# Patient Record
Sex: Male | Born: 1960 | Race: White | Hispanic: No | Smoking: Never smoker
Health system: Southern US, Community
[De-identification: ages and names within clinical notes are randomized; demographics above are authoritative.]

## PROBLEM LIST (undated history)

## (undated) DIAGNOSIS — H409 Unspecified glaucoma: Secondary | ICD-10-CM

## (undated) DIAGNOSIS — D649 Anemia, unspecified: Secondary | ICD-10-CM

## (undated) DIAGNOSIS — E785 Hyperlipidemia, unspecified: Secondary | ICD-10-CM

## (undated) DIAGNOSIS — C787 Secondary malignant neoplasm of liver and intrahepatic bile duct: Secondary | ICD-10-CM

## (undated) DIAGNOSIS — C19 Malignant neoplasm of rectosigmoid junction: Secondary | ICD-10-CM

## (undated) HISTORY — DX: Hyperlipidemia, unspecified: E78.5

## (undated) HISTORY — DX: Anemia, unspecified: D64.9

---

## 1999-01-04 ENCOUNTER — Encounter: Payer: Self-pay | Admitting: Emergency Medicine

## 1999-01-04 ENCOUNTER — Emergency Department (HOSPITAL_COMMUNITY): Admission: EM | Admit: 1999-01-04 | Discharge: 1999-01-04 | Payer: Self-pay

## 1999-01-14 ENCOUNTER — Encounter: Admission: RE | Admit: 1999-01-14 | Discharge: 1999-01-27 | Payer: Self-pay | Admitting: *Deleted

## 1999-02-23 ENCOUNTER — Encounter: Admission: RE | Admit: 1999-02-23 | Discharge: 1999-02-23 | Payer: Self-pay | Admitting: *Deleted

## 2002-02-14 ENCOUNTER — Encounter: Admission: RE | Admit: 2002-02-14 | Discharge: 2002-02-14 | Payer: Self-pay | Admitting: Family Medicine

## 2002-02-14 ENCOUNTER — Encounter: Payer: Self-pay | Admitting: Family Medicine

## 2004-05-15 HISTORY — PX: GINGIVECTOMY: SHX1707

## 2007-10-11 ENCOUNTER — Encounter: Admission: RE | Admit: 2007-10-11 | Discharge: 2007-10-11 | Payer: Self-pay | Admitting: Family Medicine

## 2011-12-15 ENCOUNTER — Other Ambulatory Visit: Payer: Self-pay | Admitting: Gastroenterology

## 2011-12-15 ENCOUNTER — Encounter (INDEPENDENT_AMBULATORY_CARE_PROVIDER_SITE_OTHER): Payer: Self-pay | Admitting: General Surgery

## 2011-12-15 DIAGNOSIS — K5900 Constipation, unspecified: Secondary | ICD-10-CM

## 2011-12-15 DIAGNOSIS — D490 Neoplasm of unspecified behavior of digestive system: Secondary | ICD-10-CM

## 2011-12-15 DIAGNOSIS — D649 Anemia, unspecified: Secondary | ICD-10-CM

## 2011-12-15 DIAGNOSIS — R634 Abnormal weight loss: Secondary | ICD-10-CM

## 2011-12-18 ENCOUNTER — Ambulatory Visit
Admission: RE | Admit: 2011-12-18 | Discharge: 2011-12-18 | Disposition: A | Discharge status: Home / Self Care | Payer: Federal, State, Local not specified - PPO | Source: Ambulatory Visit | Attending: Gastroenterology | Admitting: Gastroenterology

## 2011-12-18 DIAGNOSIS — R634 Abnormal weight loss: Secondary | ICD-10-CM

## 2011-12-18 DIAGNOSIS — D490 Neoplasm of unspecified behavior of digestive system: Secondary | ICD-10-CM

## 2011-12-18 DIAGNOSIS — D649 Anemia, unspecified: Secondary | ICD-10-CM

## 2011-12-18 DIAGNOSIS — K5900 Constipation, unspecified: Secondary | ICD-10-CM

## 2011-12-18 MED ORDER — IOHEXOL 300 MG/ML  SOLN
100.0000 mL | Freq: Once | INTRAMUSCULAR | Status: AC | PRN
  Administered 2011-12-18: 100 mL via INTRAVENOUS

## 2011-12-19 ENCOUNTER — Ambulatory Visit (INDEPENDENT_AMBULATORY_CARE_PROVIDER_SITE_OTHER): Payer: Federal, State, Local not specified - PPO | Admitting: General Surgery

## 2011-12-19 ENCOUNTER — Encounter (INDEPENDENT_AMBULATORY_CARE_PROVIDER_SITE_OTHER): Payer: Self-pay | Admitting: General Surgery

## 2011-12-19 VITALS — BP 122/68 | HR 70 | Temp 98.0°F | Resp 16 | Ht 72.0 in | Wt 185.2 lb

## 2011-12-19 DIAGNOSIS — C787 Secondary malignant neoplasm of liver and intrahepatic bile duct: Secondary | ICD-10-CM

## 2011-12-19 DIAGNOSIS — C19 Malignant neoplasm of rectosigmoid junction: Secondary | ICD-10-CM

## 2011-12-19 HISTORY — DX: Malignant neoplasm of rectosigmoid junction: C19

## 2011-12-19 HISTORY — DX: Secondary malignant neoplasm of liver and intrahepatic bile duct: C78.7

## 2011-12-19 NOTE — Progress Notes (Signed)
Patient ID: Lawrence Villa, male   DOB: 1961-01-13, 51 y.o.   MRN: [id number]  Chief Complaint  Patient presents with  . Colon Cancer    HPI Lawrence Villa is a 51 y.o. male.  He is referred by Dr. 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  The patient has noted some blood in his stools for about 6 months. He has problems with insomnia. He was found to have mild anemia. In terms of GI symptoms he has intermittent cramps and mild nausea but does not vomit. He's had a 20 pound weight loss in the past 2 months.  Recent colonoscopy was performed and reveals a circumferential fungating mass at 15 cm from the anal verge. Biopsies were taken which showed adenocarcinoma. The lumen was only a few millimeters in diameter the scope could not be passed. The rectum was normal.  CEA level is 80.6. Liver function tests are normal.  CT scan shows a circumferential mass in the mid to distal sigmoid colon and a suggestion of extraluminal spread. There are numerous liver masses consistent with metastasis. There is no bowel distention. There is no ascites.  I discussed the patient's colonoscopy findings, pathology report, and CT scan with him. He is aware that he is locally advanced cancer. His mother is here with him today.  His past history is significant for mild anemia, insomnia, anxiety. Family history is negative for colon cancer although one uncle had some type of abdominal cancer but specifics are unknown.   Social history reveals he is divorced and has 2 daughters. Denies tobacco. Drinks alcohol rarely. 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 HPI  Past Medical History  Diagnosis Date  . Hyperlipidemia   . Insomnia   . Anemia   . Cancer     colon  . Chills   . Unexplained weight loss   . Cough   . Constipation   . Rectal bleeding   . Blood in stool     Past Surgical History  Procedure Date  .  Gingivectomy 2006    Family History  Problem Relation Age of Onset  . Heart attack Paternal Grandfather     Social History History  Substance Use Topics  . Smoking status: Never Smoker   . Smokeless tobacco: Never Used  . Alcohol Use: No    No Known Allergies  Current Outpatient Prescriptions  Medication Sig Dispense Refill  . CALCIUM PO Take by mouth.      . Ferrous Sulfate (IRON) 325 (65 FE) MG TABS Take by mouth.      . Flaxseed, Linseed, (FLAX SEED OIL PO) Take by mouth.      . Glucosamine-Chondroit-Vit C-Mn (GLUCOSAMINE 1500 COMPLEX PO) Take by mouth.      . Lecithin 1200 MG CAPS Take by mouth.      Marland Kitchen LORazepam (ATIVAN) 0.5 MG tablet Take 0.5 mg by mouth every 8 (eight) hours.      . Magnesium 250 MG TABS Take by mouth.      . Multiple Vitamins-Minerals (CENTRUM PO) Take by mouth.      . Polyethylene Glycol 3350 (MIRALAX PO) Take by mouth.      . QUEtiapine (SEROQUEL) 25 MG tablet Take 25 mg by mouth at bedtime.      . vitamin E 400 UNIT capsule Take 400 Units by mouth daily.        Review of  Systems Review of Systems  Constitutional: Positive for unexpected weight change. Negative for fever and chills.  HENT: Negative for hearing loss, congestion, sore throat, trouble swallowing and voice change.   Eyes: Negative for visual disturbance.  Respiratory: Negative for cough and wheezing.   Cardiovascular: Negative for chest pain, palpitations and leg swelling.  Gastrointestinal: Positive for nausea, abdominal pain and blood in stool. Negative for vomiting, diarrhea, constipation, abdominal distention, anal bleeding and rectal pain.  Genitourinary: Negative for hematuria and difficulty urinating.  Musculoskeletal: Negative for arthralgias.  Skin: Negative for rash and wound.  Neurological: Negative for seizures, syncope, weakness and headaches.  Hematological: Negative for adenopathy. Does not bruise/bleed easily.  Psychiatric/Behavioral: Positive for disturbed  wake/sleep cycle. Negative for confusion. The patient is nervous/anxious.     Blood pressure 122/68, pulse 70, temperature 98 F (36.7 C), temperature source Temporal, resp. rate 16, height 6' (1.829 m), weight 185 lb 4 oz (84.029 kg).  Physical Exam Physical Exam  Constitutional: He is oriented to person, place, and time. He appears well-developed and well-nourished. No distress.  HENT:  Head: Normocephalic.  Nose: Nose normal.  Mouth/Throat: No oropharyngeal exudate.  Eyes: Conjunctivae and EOM are normal. Pupils are equal, round, and reactive to light. Right eye exhibits no discharge. Left eye exhibits no discharge. No scleral icterus.  Neck: Normal range of motion. Neck supple. No JVD present. No tracheal deviation present. No thyromegaly present.  Cardiovascular: Normal rate, regular rhythm, normal heart sounds and intact distal pulses.   No murmur heard. Pulmonary/Chest: Effort normal and breath sounds normal. No stridor. No respiratory distress. He has no wheezes. He has no rales. He exhibits no tenderness.  Abdominal: Soft. Bowel sounds are normal. He exhibits no distension and no mass. There is no tenderness. There is no rebound and no guarding.       The liver edge is palpable and the liver is a little bit tender. There is no mass in the left lower quadrant  or suprapubic area, and he does not seem tender down there. There is no inguinal adenopathy. His abdomen is not distended.  Musculoskeletal: Normal range of motion. He exhibits no edema and no tenderness.  Lymphadenopathy:    He has no cervical adenopathy.  Neurological: He is alert and oriented to person, place, and time. He has normal reflexes. Coordination normal.  Skin: Skin is warm and dry. No rash noted. He is not diaphoretic. No erythema. No pallor.  Psychiatric: He has a normal mood and affect. His behavior is normal. Judgment and thought content normal.    Data Reviewed I have reviewed the upper endoscopy findings,  colonoscopy findings, CT scan, lab work, and pathology report. I have discussed the case with Dr. Dulce Sellar.  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 We have rearranged our schedule and have scheduled the surgery for 7:30 AM this coming Friday, August 9.  He will stay on a blenderized diet until tomorrow and then go on a clear liquid diet. He will proceed with bowel prep on Thursday.  He will be scheduled for low anterior resection, possible colostomy, possible primary anastomosis with diverting loop ileostomy.  I discussed the indications, details, techniques, and numerous risks of the surgery with the patient and his mother. They're  aware that he has advanced cancer and that he is at higher risk for complications. Because of the bulky nature of his disease and the possible invasion of adjacent organs, the surgery will be performed as an open procedure.  We will involve medical oncology in his care postop, once we have resolved the mechanical obstruction.       Angelia Mould. Derrell Lolling, M.D., [facility] General and Minimally invasive Surgery Breast and Colorectal Surgery Office:   [phone] Pager:   [phone]  12/19/2011, 5:09 PM

## 2011-12-19 NOTE — Patient Instructions (Signed)
Your surgery is scheduled for 7:30 AM this coming Friday at [facility].  Please change to a clear liquid diet after breakfast tomorrow, Wednesday.  Be sure to take a bowel prep laxative as well as the antibiotics on Thursday.   Cancer of the Colon, Treatment by Resection  Care After Please read the instructions outlined below and refer to this sheet in the next few weeks. These discharge instructions provide you with general information on caring for yourself after you leave the hospital. Your caregiver may also give you specific instructions. While your treatment has been planned according to the most current medical practices available, unavoidable complications occasionally occur. If you have any problems or questions after discharge, please call your caregiver. HOME CARE INSTRUCTIONS   It will be normal to be sore for a couple weeks following surgery. See your caregiver if this seems to be getting worse rather than better.   Take prescribed medication as directed. Only take over-the-counter or prescription medicines for pain, discomfort, or fever as directed by your caregiver.   Use showers for bathing, until seen or do as instructed.   Follow your caregiver's instructions as to activities, exercises, physical therapy, and driving a car.   Change dressings if necessary or as directed.   You may resume normal diet and activities as directed or allowed.   Avoid lifting until you are instructed otherwise.   If you have stitches, make an appointment to have them removed by your caregiver when instructed.  SEEK MEDICAL CARE IF:   There is increased bleeding (more than a small spot) from the wound.   You notice redness, swelling, or increasing pain in the wound.   Pus is coming from wound.   An unexplained oral temperature above 102 F (38.9 C) develops.   You notice a bad smell coming from the wound or dressing.  SEEK IMMEDIATE MEDICAL CARE IF:   You develop a rash.     You have difficulty breathing   You develop any reaction or side effects to medications given.  Document Released: 11/17/2004 Document Revised: 04/20/2011 Document Reviewed: 08/27/2007 [facility] Patient Information 2012 [place], [place].

## 2011-12-20 ENCOUNTER — Telehealth (INDEPENDENT_AMBULATORY_CARE_PROVIDER_SITE_OTHER): Payer: Self-pay | Admitting: General Surgery

## 2011-12-20 NOTE — H&P (Signed)
Lawrence Villa     MRN: [id number]   Description: 51 year old male  Provider: [name], MD  Department: [facility]       Diagnoses     Cancer of rectosigmoid junction   - Primary    154.0     Vitals     BP Pulse Temp Resp Ht Wt    122/68 70 98 F (36.7 C) (Temporal) 16 6' (1.829 m) 185 lb 4 oz (84.029 kg)   BMI - 25.12 kg/m2                History and Physical    [name], MD Patient ID: Lawrence Villa, male   DOB: [birth year], 51 y.o.   MRN: [id number]           HPI Lawrence Villa is a 51 y.o. male.  He is referred by Dr. 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   The patient has noted some blood in his stools for about 6 months. He has problems with insomnia. He was found to have mild anemia. In terms of GI symptoms he has intermittent cramps and mild nausea but does not vomit. He's had a 20 pound weight loss in the past 2 months.   Recent colonoscopy was performed and reveals a circumferential fungating mass at 15 cm from the anal verge. Biopsies were taken which showed adenocarcinoma. The lumen was only a few millimeters in diameter the scope could not be passed. The rectum was normal.   CEA level is 80.6. Liver function tests are normal.   CT scan shows a circumferential mass in the mid to distal sigmoid colon and a suggestion of extraluminal spread. There are numerous liver masses consistent with metastasis. There is no bowel distention. There is no ascites.   I discussed the patient's colonoscopy findings, pathology report, and CT scan with him. He is aware that he is locally advanced cancer. His mother is here with him today.   His past history is significant for mild anemia, insomnia, anxiety. Family history is negative for colon cancer although one uncle had some type of abdominal cancer but specifics are unknown.   Social history reveals he is divorced and has 2  daughters. Denies tobacco. Drinks alcohol rarely. 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     Past Medical History   Diagnosis  Date   .  Hyperlipidemia     .  Insomnia     .  Anemia     .  Cancer         colon   .  Chills     .  Unexplained weight loss     .  Cough     .  Constipation     .  Rectal bleeding     .  Blood in stool         Past Surgical History   Procedure  Date   .  Gingivectomy  2006       Family History   Problem  Relation  Age of Onset   .  Heart attack  Paternal Grandfather        Social History History   Substance Use Topics   .  Smoking status:  Never Smoker    .  Smokeless tobacco:  Never Used   .  Alcohol Use:  No  No Known Allergies    Current Outpatient Prescriptions   Medication  Sig  Dispense  Refill   .  CALCIUM PO  Take by mouth.         .  Ferrous Sulfate (IRON) 325 (65 FE) MG TABS  Take by mouth.         .  Flaxseed, Linseed, (FLAX SEED OIL PO)  Take by mouth.         .  Glucosamine-Chondroit-Vit C-Mn (GLUCOSAMINE 1500 COMPLEX PO)  Take by mouth.         .  Lecithin 1200 MG CAPS  Take by mouth.         Marland Kitchen  LORazepam (ATIVAN) 0.5 MG tablet  Take 0.5 mg by mouth every 8 (eight) hours.         .  Magnesium 250 MG TABS  Take by mouth.         .  Multiple Vitamins-Minerals (CENTRUM PO)  Take by mouth.         .  Polyethylene Glycol 3350 (MIRALAX PO)  Take by mouth.         .  QUEtiapine (SEROQUEL) 25 MG tablet  Take 25 mg by mouth at bedtime.         .  vitamin E 400 UNIT capsule  Take 400 Units by mouth daily.            Review of Systems   Constitutional: Positive for unexpected weight change. Negative for fever and chills.  HENT: Negative for hearing loss, congestion, sore throat, trouble swallowing and voice change.   Eyes: Negative for visual disturbance.  Respiratory: Negative for cough and wheezing.   Cardiovascular: Negative for chest pain, palpitations and leg swelling.  Gastrointestinal:  Positive for nausea, abdominal pain and blood in stool. Negative for vomiting, diarrhea, constipation, abdominal distention, anal bleeding and rectal pain.  Genitourinary: Negative for hematuria and difficulty urinating.  Musculoskeletal: Negative for arthralgias.  Skin: Negative for rash and wound.  Neurological: Negative for seizures, syncope, weakness and headaches.  Hematological: Negative for adenopathy. Does not bruise/bleed easily.  Psychiatric/Behavioral: Positive for disturbed wake/sleep cycle. Negative for confusion. The patient is nervous/anxious.     Blood pressure 122/68, pulse 70, temperature 98 F (36.7 C), temperature source Temporal, resp. rate 16, height 6' (1.829 m), weight 185 lb 4 oz (84.029 kg).   Physical Exam   Constitutional: He is oriented to person, place, and time. He appears well-developed and well-nourished. No distress.  HENT:   Head: Normocephalic.   Nose: Nose normal.   Mouth/Throat: No oropharyngeal exudate.  Eyes: Conjunctivae and EOM are normal. Pupils are equal, round, and reactive to light. Right eye exhibits no discharge. Left eye exhibits no discharge. No scleral icterus.  Neck: Normal range of motion. Neck supple. No JVD present. No tracheal deviation present. No thyromegaly present.  Cardiovascular: Normal rate, regular rhythm, normal heart sounds and intact distal pulses.    No murmur heard. Pulmonary/Chest: Effort normal and breath sounds normal. No stridor. No respiratory distress. He has no wheezes. He has no rales. He exhibits no tenderness.  Abdominal: Soft. Bowel sounds are normal. He exhibits no distension and no mass. There is no tenderness. There is no rebound and no guarding.       The liver edge is palpable and the liver is a little bit tender. There is no mass in the left lower quadrant  or suprapubic area, and he does not seem tender down there. There  is no inguinal adenopathy. His abdomen is not distended.  Musculoskeletal: Normal  range of motion. He exhibits no edema and no tenderness.  Lymphadenopathy:    He has no cervical adenopathy.  Neurological: He is alert and oriented to person, place, and time. He has normal reflexes. Coordination normal.  Skin: Skin is warm and dry. No rash noted. He is not diaphoretic. No erythema. No pallor.  Psychiatric: He has a normal mood and affect. His behavior is normal. Judgment and thought content normal.    Data Reviewed I have reviewed the upper endoscopy findings, colonoscopy findings, CT scan, lab work, and pathology report. I have discussed the case with Dr. Dulce Sellar.   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 We have rearranged our schedule and have scheduled the surgery for 7:30 AM this coming Friday, August 9.   He will stay on a blenderized diet until tomorrow and then go on a clear liquid diet. He will proceed with bowel prep on Thursday.   He will be scheduled for low anterior resection, possible colostomy, possible primary anastomosis with diverting loop ileostomy.   I discussed the indications, details, techniques, and numerous risks of the surgery with the patient and his mother. They're aware that he has advanced cancer and that he is at higher risk for complications. Because of the bulky nature of his disease and the possible invasion of adjacent organs, the surgery will be performed as an open procedure.   We will involve medical oncology in his care postop, once we have resolved the mechanical obstruction.      Angelia Mould. Derrell Lolling, M.D., [facility] General and Minimally invasive Surgery Breast and Colorectal Surgery Office:   [phone] Pager:   [phone]

## 2011-12-20 NOTE — Telephone Encounter (Signed)
Called back patient per message left. Advised that Dr. Derrell Lolling instructed to wait until after he has been released from hospital to determine the post op appointment date. Advised if there are any questions to call back.

## 2011-12-20 NOTE — Pre-Procedure Instructions (Signed)
Chest ct 12-18-2011 epic

## 2011-12-21 ENCOUNTER — Telehealth (INDEPENDENT_AMBULATORY_CARE_PROVIDER_SITE_OTHER): Payer: Self-pay | Admitting: General Surgery

## 2011-12-21 ENCOUNTER — Encounter (HOSPITAL_COMMUNITY): Payer: Self-pay | Admitting: Pharmacy Technician

## 2011-12-21 ENCOUNTER — Encounter (HOSPITAL_COMMUNITY)
Admission: RE | Admit: 2011-12-21 | Discharge: 2011-12-21 | Disposition: A | Discharge status: Home / Self Care | Payer: Federal, State, Local not specified - PPO | Source: Ambulatory Visit | Attending: General Surgery | Admitting: General Surgery

## 2011-12-21 ENCOUNTER — Encounter (HOSPITAL_COMMUNITY): Payer: Self-pay

## 2011-12-21 LAB — URINALYSIS, ROUTINE W REFLEX MICROSCOPIC
Bilirubin Urine: NEGATIVE
Ketones, ur: NEGATIVE mg/dL
Nitrite: NEGATIVE
Protein, ur: NEGATIVE mg/dL
Specific Gravity, Urine: 1.008 (ref 1.005–1.030)
Urobilinogen, UA: 0.2 mg/dL (ref 0.0–1.0)

## 2011-12-21 LAB — TYPE AND SCREEN
ABO/RH(D): A POS
Antibody Screen: NEGATIVE

## 2011-12-21 LAB — COMPREHENSIVE METABOLIC PANEL
Albumin: 3.4 g/dL — ABNORMAL LOW (ref 3.5–5.2)
Alkaline Phosphatase: 126 U/L — ABNORMAL HIGH (ref 39–117)
BUN: 13 mg/dL (ref 6–23)
Chloride: 93 mEq/L — ABNORMAL LOW (ref 96–112)
Glucose, Bld: 103 mg/dL — ABNORMAL HIGH (ref 70–99)
Potassium: 4.6 mEq/L (ref 3.5–5.1)
Total Bilirubin: 0.6 mg/dL (ref 0.3–1.2)

## 2011-12-21 LAB — ABO/RH: ABO/RH(D): A POS

## 2011-12-21 LAB — PROTIME-INR
INR: 1.25 (ref 0.00–1.49)
Prothrombin Time: 16 seconds — ABNORMAL HIGH (ref 11.6–15.2)

## 2011-12-21 LAB — CBC
Hemoglobin: 11 g/dL — ABNORMAL LOW (ref 13.0–17.0)
Platelets: 434 10*3/uL — ABNORMAL HIGH (ref 150–400)
RBC: 4.2 MIL/uL — ABNORMAL LOW (ref 4.22–5.81)
WBC: 10.8 10*3/uL — ABNORMAL HIGH (ref 4.0–10.5)

## 2011-12-21 LAB — SURGICAL PCR SCREEN: Staphylococcus aureus: NEGATIVE

## 2011-12-21 NOTE — Patient Instructions (Addendum)
20 [address] BOGGUS  12/21/2011   Your procedure is scheduled on:  12-22-2011  Report to [facility] at 0530  AM.  Call this number if you have problems the morning of surgery: [phone]   Remember:   Do not eat food or drink liquids:After Midnight.  .  Take these medicines the morning of surgery with A SIP OF WATER: ativan if needed   Do not wear jewelry or make up.  Do not wear lotions, powders, or perfumes.Do not wear deodorant.    Do not bring valuables to the hospital.  Contacts, dentures or bridgework may not be worn into surgery.  Leave suitcase in the car. After surgery it may be brought to your room.  For patients admitted to the hospital, checkout time is 11:00 AM the day    discharge                             Special Instructions: CHG Shower Use Special Wash: 1/2 bottle night before surgery and 1/2 bottle morning of surgery, use regular soap on face and front and back private area.   Please read over the following fact sheets that you were given: MRSA Information  [facility] pre op nurse phone number [phone], call if needed

## 2011-12-21 NOTE — Progress Notes (Signed)
Pt, ptt, results routed to dr Derrell Lolling in basket

## 2011-12-21 NOTE — Telephone Encounter (Signed)
Patient called in to confirm that the only things needed to prep for surgery tomorrow where the two antibiotics and the colon prep (golytely). Advised to follow the instructions given. Confirmed that he did receive the voicemail left yesterday pertaining to having to wait before the post op appointment can be set.

## 2011-12-21 NOTE — Pre-Procedure Instructions (Signed)
Pre op teaching done using the teach back method normal sleep study results 11-07-2010 [facility] on chart

## 2011-12-22 ENCOUNTER — Encounter (HOSPITAL_COMMUNITY): Payer: Self-pay | Admitting: Anesthesiology

## 2011-12-22 ENCOUNTER — Encounter (HOSPITAL_COMMUNITY): Payer: Self-pay | Admitting: *Deleted

## 2011-12-22 ENCOUNTER — Inpatient Hospital Stay (HOSPITAL_COMMUNITY)
Admission: RE | Admit: 2011-12-22 | Discharge: 2011-12-28 | DRG: 146 | Disposition: A | Discharge status: Home / Self Care | Payer: Federal, State, Local not specified - PPO | Source: Ambulatory Visit | Attending: General Surgery | Admitting: General Surgery

## 2011-12-22 ENCOUNTER — Encounter (HOSPITAL_COMMUNITY)
Admission: RE | Disposition: A | Discharge status: Home / Self Care | Payer: Self-pay | Source: Ambulatory Visit | Attending: General Surgery

## 2011-12-22 ENCOUNTER — Ambulatory Visit (HOSPITAL_COMMUNITY): Payer: Federal, State, Local not specified - PPO | Admitting: Anesthesiology

## 2011-12-22 DIAGNOSIS — R634 Abnormal weight loss: Secondary | ICD-10-CM | POA: Diagnosis present

## 2011-12-22 DIAGNOSIS — Z01812 Encounter for preprocedural laboratory examination: Secondary | ICD-10-CM

## 2011-12-22 DIAGNOSIS — F411 Generalized anxiety disorder: Secondary | ICD-10-CM | POA: Diagnosis present

## 2011-12-22 DIAGNOSIS — K6289 Other specified diseases of anus and rectum: Secondary | ICD-10-CM | POA: Diagnosis present

## 2011-12-22 DIAGNOSIS — C20 Malignant neoplasm of rectum: Principal | ICD-10-CM | POA: Diagnosis present

## 2011-12-22 DIAGNOSIS — C787 Secondary malignant neoplasm of liver and intrahepatic bile duct: Secondary | ICD-10-CM

## 2011-12-22 DIAGNOSIS — C19 Malignant neoplasm of rectosigmoid junction: Secondary | ICD-10-CM | POA: Diagnosis present

## 2011-12-22 DIAGNOSIS — C772 Secondary and unspecified malignant neoplasm of intra-abdominal lymph nodes: Secondary | ICD-10-CM | POA: Diagnosis present

## 2011-12-22 DIAGNOSIS — G4700 Insomnia, unspecified: Secondary | ICD-10-CM | POA: Diagnosis present

## 2011-12-22 DIAGNOSIS — D509 Iron deficiency anemia, unspecified: Secondary | ICD-10-CM | POA: Diagnosis present

## 2011-12-22 DIAGNOSIS — D649 Anemia, unspecified: Secondary | ICD-10-CM | POA: Diagnosis present

## 2011-12-22 DIAGNOSIS — C189 Malignant neoplasm of colon, unspecified: Secondary | ICD-10-CM

## 2011-12-22 HISTORY — DX: Malignant neoplasm of rectosigmoid junction: C19

## 2011-12-22 HISTORY — PX: BOWEL RESECTION: SHX1257

## 2011-12-22 HISTORY — PX: LIVER BIOPSY: SHX301

## 2011-12-22 LAB — APTT: aPTT: 41 seconds — ABNORMAL HIGH (ref 24–37)

## 2011-12-22 SURGERY — RESECTION, RECTUM, LOW ANTERIOR
Anesthesia: General | Wound class: Clean Contaminated

## 2011-12-22 MED ORDER — MIDAZOLAM HCL 5 MG/5ML IJ SOLN
INTRAMUSCULAR | Status: DC | PRN
  Administered 2011-12-22: 2 mg via INTRAVENOUS

## 2011-12-22 MED ORDER — DEXTROSE 5 % IV SOLN
2.0000 g | Freq: Three times a day (TID) | INTRAVENOUS | Status: AC
  Administered 2011-12-22 – 2011-12-23 (×3): 2 g via INTRAVENOUS
  Filled 2011-12-22 (×4): qty 2

## 2011-12-22 MED ORDER — CEFOXITIN SODIUM-DEXTROSE 1-4 GM-% IV SOLR (PREMIX)
INTRAVENOUS | Status: AC
  Filled 2011-12-22: qty 100

## 2011-12-22 MED ORDER — METRONIDAZOLE IN NACL 5-0.79 MG/ML-% IV SOLN
INTRAVENOUS | Status: AC
  Filled 2011-12-22: qty 100

## 2011-12-22 MED ORDER — NALOXONE HCL 0.4 MG/ML IJ SOLN: 0.4000 mg | INTRAMUSCULAR | Status: DC | PRN

## 2011-12-22 MED ORDER — SODIUM CHLORIDE 0.9 % IJ SOLN: 9.0000 mL | INTRAMUSCULAR | Status: DC | PRN

## 2011-12-22 MED ORDER — KETAMINE HCL 10 MG/ML IJ SOLN
INTRAMUSCULAR | Status: DC | PRN
  Administered 2011-12-22 (×6): 2 mg via INTRAVENOUS
  Administered 2011-12-22: 25 mg via INTRAVENOUS
  Administered 2011-12-22 (×4): 2 mg via INTRAVENOUS

## 2011-12-22 MED ORDER — LIDOCAINE HCL (CARDIAC) 20 MG/ML IV SOLN
INTRAVENOUS | Status: DC | PRN
  Administered 2011-12-22: 30 mg via INTRAVENOUS

## 2011-12-22 MED ORDER — FENTANYL CITRATE 0.05 MG/ML IJ SOLN
INTRAMUSCULAR | Status: DC | PRN
  Administered 2011-12-22 (×2): 50 ug via INTRAVENOUS
  Administered 2011-12-22: 25 ug via INTRAVENOUS
  Administered 2011-12-22: 50 ug via INTRAVENOUS
  Administered 2011-12-22: 25 ug via INTRAVENOUS
  Administered 2011-12-22: 50 ug via INTRAVENOUS

## 2011-12-22 MED ORDER — 0.9 % SODIUM CHLORIDE (POUR BTL) OPTIME
TOPICAL | Status: DC | PRN
  Administered 2011-12-22: 4000 mL

## 2011-12-22 MED ORDER — HEPARIN SODIUM (PORCINE) 5000 UNIT/ML IJ SOLN
5000.0000 [IU] | Freq: Once | INTRAMUSCULAR | Status: AC
  Administered 2011-12-22: 5000 [IU] via SUBCUTANEOUS
  Filled 2011-12-22: qty 1

## 2011-12-22 MED ORDER — DEXAMETHASONE SODIUM PHOSPHATE 4 MG/ML IJ SOLN
INTRAMUSCULAR | Status: DC | PRN
  Administered 2011-12-22: 10 mg via INTRAVENOUS

## 2011-12-22 MED ORDER — METRONIDAZOLE IN NACL 5-0.79 MG/ML-% IV SOLN
INTRAVENOUS | Status: DC | PRN
  Administered 2011-12-22: 500 mg via INTRAVENOUS

## 2011-12-22 MED ORDER — POTASSIUM CHLORIDE IN NACL 20-0.9 MEQ/L-% IV SOLN
125.0000 mL/h | INTRAVENOUS | Status: DC
  Administered 2011-12-22 – 2011-12-27 (×12): 125 mL/h via INTRAVENOUS
  Filled 2011-12-22 (×15): qty 1000

## 2011-12-22 MED ORDER — HEPARIN SODIUM (PORCINE) 5000 UNIT/ML IJ SOLN
5000.0000 [IU] | Freq: Three times a day (TID) | INTRAMUSCULAR | Status: DC
  Administered 2011-12-22 – 2011-12-28 (×17): 5000 [IU] via SUBCUTANEOUS
  Filled 2011-12-22 (×21): qty 1

## 2011-12-22 MED ORDER — DIPHENHYDRAMINE HCL 12.5 MG/5ML PO ELIX: 12.5000 mg | ORAL_SOLUTION | Freq: Four times a day (QID) | ORAL | Status: DC | PRN

## 2011-12-22 MED ORDER — DEXTROSE 5 % IV SOLN: 2.0000 g | INTRAVENOUS | Status: DC

## 2011-12-22 MED ORDER — ROCURONIUM BROMIDE 100 MG/10ML IV SOLN
INTRAVENOUS | Status: DC | PRN
  Administered 2011-12-22: 50 mg via INTRAVENOUS
  Administered 2011-12-22: 20 mg via INTRAVENOUS
  Administered 2011-12-22 (×2): 10 mg via INTRAVENOUS

## 2011-12-22 MED ORDER — HYDROMORPHONE 0.3 MG/ML IV SOLN
INTRAVENOUS | Status: DC
  Administered 2011-12-22: 11:00:00 via INTRAVENOUS

## 2011-12-22 MED ORDER — LACTATED RINGERS IV SOLN
INTRAVENOUS | Status: DC | PRN
  Administered 2011-12-22: 08:00:00 via INTRAVENOUS

## 2011-12-22 MED ORDER — HYDROMORPHONE HCL PF 1 MG/ML IJ SOLN
0.2500 mg | INTRAMUSCULAR | Status: DC | PRN
Start: 2011-12-22 — End: 2011-12-22

## 2011-12-22 MED ORDER — OXYCODONE HCL 5 MG PO TABS: 5.0000 mg | ORAL_TABLET | Freq: Once | ORAL | Status: DC | PRN

## 2011-12-22 MED ORDER — HYDROMORPHONE 0.3 MG/ML IV SOLN
INTRAVENOUS | Status: AC
  Filled 2011-12-22: qty 25

## 2011-12-22 MED ORDER — DIPHENHYDRAMINE HCL 50 MG/ML IJ SOLN
12.5000 mg | Freq: Four times a day (QID) | INTRAMUSCULAR | Status: DC | PRN
  Administered 2011-12-22: 12.5 mg via INTRAVENOUS
  Filled 2011-12-22: qty 1

## 2011-12-22 MED ORDER — GLYCOPYRROLATE 0.2 MG/ML IJ SOLN
INTRAMUSCULAR | Status: DC | PRN
  Administered 2011-12-22: .5 mg via INTRAVENOUS

## 2011-12-22 MED ORDER — ACETAMINOPHEN 10 MG/ML IV SOLN
INTRAVENOUS | Status: AC
  Filled 2011-12-22: qty 100

## 2011-12-22 MED ORDER — ACETAMINOPHEN 10 MG/ML IV SOLN: 1000.0000 mg | Freq: Once | INTRAVENOUS | Status: DC | PRN

## 2011-12-22 MED ORDER — ONDANSETRON HCL 4 MG/2ML IJ SOLN
INTRAMUSCULAR | Status: DC | PRN
  Administered 2011-12-22: 4 mg via INTRAVENOUS

## 2011-12-22 MED ORDER — INDIGOTINDISULFONATE SODIUM 8 MG/ML IJ SOLN
INTRAMUSCULAR | Status: DC | PRN
  Administered 2011-12-22: 5 mL via INTRAVENOUS

## 2011-12-22 MED ORDER — ONDANSETRON HCL 4 MG/2ML IJ SOLN: 4.0000 mg | Freq: Four times a day (QID) | INTRAMUSCULAR | Status: DC | PRN

## 2011-12-22 MED ORDER — LACTATED RINGERS IV SOLN
INTRAVENOUS | Status: DC | PRN
  Administered 2011-12-22 (×3): via INTRAVENOUS

## 2011-12-22 MED ORDER — MEPERIDINE HCL 50 MG/ML IJ SOLN: 6.2500 mg | INTRAMUSCULAR | Status: DC | PRN

## 2011-12-22 MED ORDER — METOCLOPRAMIDE HCL 5 MG/ML IJ SOLN
INTRAMUSCULAR | Status: DC | PRN
  Administered 2011-12-22 (×2): 5 mg via INTRAVENOUS

## 2011-12-22 MED ORDER — ONDANSETRON HCL 4 MG PO TABS: 4.0000 mg | ORAL_TABLET | Freq: Four times a day (QID) | ORAL | Status: DC | PRN

## 2011-12-22 MED ORDER — INDIGOTINDISULFONATE SODIUM 8 MG/ML IJ SOLN
INTRAMUSCULAR | Status: AC
  Filled 2011-12-22: qty 5

## 2011-12-22 MED ORDER — POTASSIUM CHLORIDE IN NACL 20-0.9 MEQ/L-% IV SOLN
INTRAVENOUS | Status: AC
  Filled 2011-12-22: qty 1000

## 2011-12-22 MED ORDER — NEOSTIGMINE METHYLSULFATE 1 MG/ML IJ SOLN
INTRAMUSCULAR | Status: DC | PRN
  Administered 2011-12-22: 4 mg via INTRAVENOUS

## 2011-12-22 MED ORDER — OXYCODONE HCL 5 MG/5ML PO SOLN
5.0000 mg | Freq: Once | ORAL | Status: DC | PRN
  Filled 2011-12-22: qty 5

## 2011-12-22 MED ORDER — PROPOFOL 10 MG/ML IV EMUL
INTRAVENOUS | Status: DC | PRN
  Administered 2011-12-22: 150 mg via INTRAVENOUS

## 2011-12-22 MED ORDER — PROMETHAZINE HCL 25 MG/ML IJ SOLN: 6.2500 mg | INTRAMUSCULAR | Status: DC | PRN

## 2011-12-22 MED ORDER — HYDROMORPHONE HCL PF 1 MG/ML IJ SOLN
INTRAMUSCULAR | Status: DC | PRN
  Administered 2011-12-22 (×4): 0.5 mg via INTRAVENOUS

## 2011-12-22 MED ORDER — CEFAZOLIN SODIUM 1-5 GM-% IV SOLN
INTRAVENOUS | Status: DC | PRN
  Administered 2011-12-22: 2 g via INTRAVENOUS

## 2011-12-22 SURGICAL SUPPLY — 59 items
APPLICATOR COTTON TIP 6IN STRL (MISCELLANEOUS) ×2 IMPLANT
BLADE EXTENDED COATED 6.5IN (ELECTRODE) ×1 IMPLANT
BLADE HEX COATED 2.75 (ELECTRODE) ×2 IMPLANT
BLADE SURG SZ10 CARB STEEL (BLADE) ×1 IMPLANT
CANISTER SUCTION 2500CC (MISCELLANEOUS) ×3 IMPLANT
CLIP TI LARGE 6 (CLIP) IMPLANT
CLOTH BEACON ORANGE TIMEOUT ST (SAFETY) ×2 IMPLANT
COVER MAYO STAND STRL (DRAPES) ×2 IMPLANT
DRAIN CHANNEL 19F RND (DRAIN) ×1 IMPLANT
DRAPE LAPAROSCOPIC ABDOMINAL (DRAPES) ×2 IMPLANT
DRAPE LG THREE QUARTER DISP (DRAPES) ×1 IMPLANT
DRAPE WARM FLUID 44X44 (DRAPE) ×2 IMPLANT
DRESSING TELFA 8X3 (GAUZE/BANDAGES/DRESSINGS) ×2 IMPLANT
DRSG PAD ABDOMINAL 8X10 ST (GAUZE/BANDAGES/DRESSINGS) ×1 IMPLANT
ELECT BLADE TIP CTD 4 INCH (ELECTRODE) ×1 IMPLANT
ELECT REM PT RETURN 9FT ADLT (ELECTROSURGICAL) ×2
ELECTRODE REM PT RTRN 9FT ADLT (ELECTROSURGICAL) ×1 IMPLANT
ENSEAL DEVICE STD TIP 35CM (ENDOMECHANICALS) ×1 IMPLANT
EVACUATOR SILICONE 100CC (DRAIN) ×1 IMPLANT
GLOVE BIOGEL PI IND STRL 7.0 (GLOVE) ×1 IMPLANT
GLOVE BIOGEL PI INDICATOR 7.0 (GLOVE) ×1
GLOVE EUDERMIC 7 POWDERFREE (GLOVE) ×4 IMPLANT
GOWN STRL NON-REIN LRG LVL3 (GOWN DISPOSABLE) ×2 IMPLANT
GOWN STRL REIN XL XLG (GOWN DISPOSABLE) ×4 IMPLANT
HAND ACTIVATED (MISCELLANEOUS) IMPLANT
HEMOSTAT SURGICEL 2X14 (HEMOSTASIS) ×1 IMPLANT
KIT BASIN OR (CUSTOM PROCEDURE TRAY) ×2 IMPLANT
LEGGING LITHOTOMY PAIR STRL (DRAPES) ×1 IMPLANT
LIGASURE IMPACT 36 18CM CVD LR (INSTRUMENTS) ×2 IMPLANT
LOOP VESSEL MAXI BLUE (MISCELLANEOUS) ×2 IMPLANT
NS IRRIG 1000ML POUR BTL (IV SOLUTION) ×6 IMPLANT
PACK BASIC VI WITH GOWN DISP (CUSTOM PROCEDURE TRAY) ×1 IMPLANT
PACK GENERAL/GYN (CUSTOM PROCEDURE TRAY) ×2 IMPLANT
SPONGE GAUZE 4X4 12PLY (GAUZE/BANDAGES/DRESSINGS) ×2 IMPLANT
SPONGE LAP 18X18 X RAY DECT (DISPOSABLE) ×2 IMPLANT
STAPLER CIRC CVD 29MM 37CM (STAPLE) ×1 IMPLANT
STAPLER CUT CVD 40MM GREEN (STAPLE) ×1 IMPLANT
STAPLER PROXIMATE 75MM BLUE (STAPLE) ×1 IMPLANT
STAPLER VISISTAT 35W (STAPLE) ×2 IMPLANT
SUCTION POOLE TIP (SUCTIONS) ×2 IMPLANT
SUT ETHILON 3 0 PS 1 (SUTURE) ×1 IMPLANT
SUT NOV 1 T60/GS (SUTURE) IMPLANT
SUT NOVA NAB DX-16 0-1 5-0 T12 (SUTURE) IMPLANT
SUT NOVA T20/GS 25 (SUTURE) IMPLANT
SUT PDS AB 1 TP1 96 (SUTURE) ×4 IMPLANT
SUT PROLENE 0 SH 30 (SUTURE) ×1 IMPLANT
SUT PROLENE 2 0 SH DA (SUTURE) ×1 IMPLANT
SUT SILK 2 0 (SUTURE) ×2
SUT SILK 2 0 SH CR/8 (SUTURE) ×2 IMPLANT
SUT SILK 2 0SH CR/8 30 (SUTURE) IMPLANT
SUT SILK 2-0 18XBRD TIE 12 (SUTURE) ×1 IMPLANT
SUT SILK 2-0 30XBRD TIE 12 (SUTURE) IMPLANT
SUT SILK 3 0 (SUTURE) ×2
SUT SILK 3 0 SH CR/8 (SUTURE) ×2 IMPLANT
SUT SILK 3-0 18XBRD TIE 12 (SUTURE) ×2 IMPLANT
TAPE CLOTH SURG 6X10 WHT LF (GAUZE/BANDAGES/DRESSINGS) ×1 IMPLANT
TOWEL OR 17X26 10 PK STRL BLUE (TOWEL DISPOSABLE) ×4 IMPLANT
TRAY FOLEY CATH 14FRSI W/METER (CATHETERS) ×2 IMPLANT
YANKAUER SUCT BULB TIP NO VENT (SUCTIONS) ×2 IMPLANT

## 2011-12-22 NOTE — Care Management Note (Signed)
    Page 1 of 1   12/28/2011     10:34:15 AM   CARE MANAGEMENT NOTE 12/28/2011  Patient:  ONA, RATHERT   Account Number:  [id number]  Date Initiated:  12/22/2011  Documentation initiated by:  Lorenda Ishihara  Subjective/Objective Assessment:   51 yo male admitted s/p colon resection. PTA lived at home alone     Action/Plan:   Anticipated DC Date:  12/26/2011   Anticipated DC Plan:  HOME/SELF CARE      DC Planning Services  CM consult      Choice offered to / List presented to:             Status of service:  Completed, signed off Medicare Important Message given?   (If response is "NO", the following Medicare IM given date fields will be blank) Date Medicare IM given:   Date Additional Medicare IM given:    Discharge Disposition:  HOME/SELF CARE  Per UR Regulation:  Reviewed for med. necessity/level of care/duration of stay  If discussed at Long Length of Stay Meetings, dates discussed:    Comments:

## 2011-12-22 NOTE — Anesthesia Postprocedure Evaluation (Signed)
Anesthesia Post Note  Patient: Lawrence Villa  Procedure(s) Performed: Procedure(s) (LRB): LOW ANTERIOR BOWEL RESECTION (N/A) LIVER BIOPSY (N/A)  Anesthesia type: General  Patient location: PACU  Post pain: Pain level controlled  Post assessment: Post-op Vital signs reviewed  Last Vitals: BP 116/67  Pulse 45  Temp 36.6 C (Oral)  Resp 11  SpO2 100%  Post vital signs: Reviewed  Level of consciousness: sedated  Complications: No apparent anesthesia complications

## 2011-12-22 NOTE — Preoperative (Signed)
Beta Blockers   Reason not to administer Beta Blockers:Not Applicable, not on home BB 

## 2011-12-22 NOTE — Anesthesia Preprocedure Evaluation (Addendum)
Anesthesia Evaluation  Patient identified by MRN, date of birth, ID band Patient awake    Reviewed: Allergy & Precautions, H&P , NPO status , Patient's Chart, lab work & pertinent test results  History of Anesthesia Complications Negative for: history of anesthetic complications  Airway Mallampati: I TM Distance: >3 FB Neck ROM: Full    Dental  (+) Teeth Intact and Dental Advisory Given   Pulmonary neg pulmonary ROS,  breath sounds clear to auscultation  Pulmonary exam normal       Cardiovascular Exercise Tolerance: Good negative cardio ROS  Rhythm:Regular Rate:Normal     Neuro/Psych negative neurological ROS  negative psych ROS   GI/Hepatic negative GI ROS, Neg liver ROS,   Endo/Other  negative endocrine ROS  Renal/GU negative Renal ROS  negative genitourinary   Musculoskeletal negative musculoskeletal ROS (+)   Abdominal (+)  Abdomen: soft.    Peds  Hematology Blood loss anemia   Anesthesia Other Findings   Reproductive/Obstetrics                          Anesthesia Physical  Anesthesia Plan  ASA: II  Anesthesia Plan: General   Post-op Pain Management:    Induction: Intravenous  Airway Management Planned: Oral ETT  Additional Equipment:   Intra-op Plan:   Post-operative Plan: Extubation in OR  Informed Consent:   Dental advisory given  Plan Discussed with: CRNA and Surgeon  Anesthesia Plan Comments:         Anesthesia Quick Evaluation  

## 2011-12-22 NOTE — Transfer of Care (Signed)
Immediate Anesthesia Transfer of Care Note  Patient: Lawrence Villa  Procedure(s) Performed: Procedure(s) (LRB): LOW ANTERIOR BOWEL RESECTION (N/A) LIVER BIOPSY (N/A)  Patient Location: PACU  Anesthesia Type: General  Level of Consciousness: awake, alert , patient cooperative and responds to stimulation  Airway & Oxygen Therapy: Patient Spontanous Breathing and Patient connected to face mask oxygen  Post-op Assessment: Report given to PACU RN, Post -op Vital signs reviewed and stable and Patient moving all extremities  Post vital signs: Reviewed and stable  Complications: No apparent anesthesia complications

## 2011-12-22 NOTE — Interval H&P Note (Signed)
History and Physical Interval Note:  12/22/2011 7:22 AM  Lawrence Villa  has presented today for surgery, with the diagnosis of colon cancer  The goals of treatment and the various methods of treatment have been discussed with the patient and family. After consideration of risks, benefits and other options for treatment, the patient has consented to  Procedure(s) (LRB): LOW ANTERIOR RESECTION, POSSIBLE COLOSTOMY(N/A) LIVER BIOPSY (N/A) as a surgical intervention .  The patient's history has been reviewed and the patient examined today, no change in status, stable for surgery.  I have reviewed the patient's chart and labs.  Questions were answered to the patient's satisfaction.     [name]

## 2011-12-22 NOTE — Op Note (Addendum)
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 Derrell Lolling, M.D., FACS  Assistant:                      Gaynelle Adu, M.D., [facility]  Operative Indications:   This is a 51 year old male who has recently noted some blood in his stools for about 6 months. He's had some crampy abdominal pain and a 20 pound weight loss over the past 2 months. Colonoscopy reveals a circumferential fungating mass at 15 cm. The lumen was only a few millimeters and endoscope could not be passed through it. Biopsy showed adenocarcinoma. CEA level is 80.6, liver function test are normal. CT scan shows a circumferential mass in the mid to distal sigmoid colon and a suggestion of mesenteric  adenopathy and numerous liver metastasis radiographically. The bowel was not distended. There is no ascites. The patient was counseled as an outpatient and  examination reveals a palpable liver edge in the right upper quadrant. He is aware that he has metastatic disease. He underwent a bowel prep at home yesterday and is brought to the operating room for surgery.    Operative Findings:       The patient had a bulky cancer of the proximal rectum. Proctoscopy revealed this to be at 13 cm by rigid proctoscopy. He had bulky lymph nodes in the mesentery at the sacral promontory. The tumor had not invaded the ureters or the bladder. Both ureters were identified and preserved. I did not palpate any other masses in the abdomen other than in the liver where there were numerous bi- lobar metastasis. One of these was  excised for histologic confirmation.  Procedure in Detail:          Following the induction of general endotracheal anesthesia a nasogastric tube and Foley catheter was placed. The patient was placed in the modified lithotomy position in rigid stirrups. Surgical time out was performed. Rigid proctoscopy was carried out and the distal and mid rectum looked normal but I encountered a circumferential tumor at 13 cm and I could not pass the scope beyond this. I irrigated out the rectum with dilute Betadine and saline.  The abdomen and perianal area were then prepped and draped in a sterile fashion. Surgical time out was performed. The patient received intravenous antibiotics preop. A lower midline incision was made. The fascia was incised in the midline. The abdominal cavity was entered and explored with findings as described above.  I decided to perform a wedge excisional biopsy of a 2.0  centimeter mass in the right lobe of the liver at  the liver edge. I did this with electrocautery and removed and sent the specimen for permanent histology. There was really no bleeding. I placed a piece of Surgicel gauze on the biopsy site..  I placed self  retaining  retractors. I mobilized the rectosigmoid colon by dividing the lateral peritoneal attachments. I had a fair amount of redundant sigmoid. I took the  dissection back to the iliacs and the ureters. I identified both the right and left ureters and looped them with soft vessel loops. I decided to transect the sigmoid colon at the level of the mid sigmoid. This was performed with the GIA stapling device. Mesenteric vessels were taken down with LigaSure device for small vessels and 2-0 silk suture ligatures for larger vessels. Just above the sacral promontory I dissected out a very bulky area of adenopathy.  This adenopathy had not invaded the presacral fascia. I entered the presacral space and performed a mesorectal excision down and around the tumor. I transected the  distal rectum above the level of the peritoneal reflection with the contour stapler. The specimen was removed and sent to the lab. The pathologist stated  that we had a 4 cm distal margin and a 12 cm proximal margin. There did not appear to be any palpable disease left in the lower abdomen or pelvis.  I decided to create an anastomosis with an EEA stapling device. I cut off the staple line of the proximal colon until I could easily put a 29 mm EEA stapler there. I placed a pursestring suture of 0 Prolene and the proximal colon and then tied it down on the anvil of a 29 mm EEA stapler. There was a slight gap around the spike and so I put one more purse string suture of  Prolene and that tightened things up nicely. Dr. Andrey Campanile performed anal dilatation and then inserted the stapler. We opened the spike of the stapler above and anterior to the staple line of the rectal stump. We placed the proximal colon on and secured it onto the stapler being sure that we were not twisting the proximal colon. We closed the stapler, fired it,  opened it and removed it. We found that we had 2 excellent doughnuts of tissue. Proctoscopy was performed and the colon was insufflated with air and there was no leak anywhere that we could see.  We changed our instruments and gloves and gowns at this point. We irrigated out the abdomen and pelvis. We found no bleeding. We discussed whether we should perform a  diverting loop ileostomy  but we could not find any compelling reason to do so and so we simply placed a 19 [place] Blake drain in the pelvis and brought that out through a right lower quadrant stab incision and sutured it to the skin. The omentum was brought down under the incision. The midline fascia was closed with a running suture of #1 double stranded PDS. The skin was closed with skin staples placing Telfa wicks in between the skin staples for drainage. Clean bandage was placed and the patient taken to recovery in stable condition  condition.  It should be mentioned that we gave indigo carmine during the latter part of the case which came out in the urine but there was no evidence of any leak in the abdominal cavity.  After closing the abdominal incision clean bandages were placed. There were no complications. Counts were correct. EBL 125 cc.     Angelia Mould. Derrell Lolling, M.D., FACS General and Minimally Invasive Surgery Breast and Colorectal Surgery  12/22/2011 10:33 AM

## 2011-12-23 LAB — CBC
MCV: 80.8 fL (ref 78.0–100.0)
Platelets: 497 10*3/uL — ABNORMAL HIGH (ref 150–400)
RDW: 14.4 % (ref 11.5–15.5)
WBC: 11.1 10*3/uL — ABNORMAL HIGH (ref 4.0–10.5)

## 2011-12-23 LAB — BASIC METABOLIC PANEL
CO2: 24 mEq/L (ref 19–32)
Calcium: 8.2 mg/dL — ABNORMAL LOW (ref 8.4–10.5)
Creatinine, Ser: 0.96 mg/dL (ref 0.50–1.35)
GFR calc Af Amer: >90 mL/min (ref >90)

## 2011-12-23 MED ORDER — LORAZEPAM 2 MG/ML IJ SOLN
1.0000 mg | Freq: Three times a day (TID) | INTRAMUSCULAR | Status: DC | PRN
  Administered 2011-12-23 – 2011-12-26 (×4): 1 mg via INTRAVENOUS
  Filled 2011-12-23 (×4): qty 1

## 2011-12-23 NOTE — Progress Notes (Signed)
Patient ID: Lawrence Villa, male   DOB: 10/19/1960, 51 y.o.   MRN: [id number]  General Surgery - [facility] - Progress Note  POD# 1  Subjective: Patient up to chair.  Very uncomfortable from Foley - asks that it be removed today.  Wants something for sleep.  Objective: Vital signs in last 24 hours: Temp:  [97.8 F (36.6 C)-98.6 F (37 C)] 97.9 F (36.6 C) (08/10 0519) Pulse Rate:  [45-87] 48  (08/10 1000) Resp:  [11-20] 18  (08/10 1000) BP: (116-143)/(67-84) 135/77 mmHg (08/10 1000) SpO2:  [99 %-100 %] 100 % (08/10 1000) FiO2 (%):  [100 %] 100 % (08/10 2956) Weight:  [184 lb (83.462 kg)] 184 lb (83.462 kg) (08/09 1156) Last BM Date: 12/21/11  Intake/Output from previous day: 08/09 0701 - 08/10 0700 In: 5577.1 [I.V.:5527.1; NG/GT:30] Out: 2695 [Urine:2225; Emesis/NG output:250; Drains:120; Blood:100]  Exam: HEENT - clear, not icteric Neck - soft Chest - clear bilaterally Cor - RRR, no murmur Abd - soft, mild distension, quiet; dressings dry and intact; JP with thin serosanguinous Ext - no significant edema Neuro - grossly intact, no focal deficits  Lab Results:   Basename 12/23/11 0515 12/21/11 1015  WBC 11.1* 10.8*  HGB 10.3* 11.0*  HCT 32.0* 33.5*  PLT 497* 434*     Basename 12/23/11 0515 12/21/11 1015  NA 135 132*  K 5.0 4.6  CL 101 93*  CO2 24 30  GLUCOSE 110* 103*  BUN 12 13  CREATININE 0.96 1.06  CALCIUM 8.2* 9.5    Studies/Results: No results found.  Assessment / Plan: 1.  Status post low anterior resection for rectal carcinoma  - NPO, NG decompression, IV hydration  - will remove Foley today at patient request  - Ativan prn sleep ordered  - OOB, ambulate  - Dr. Derrell Lolling to discuss operative findings with patient on Monday  [name], MD, [facility] Office: [phone]  12/23/2011

## 2011-12-23 NOTE — Progress Notes (Signed)
Patients Foley catheter removed per order, informed him that i spoke to Dr. Abbey Chatters and he wanted me to inform patient that a foley may have to be reinserted due to bladder manipulation during surgery, patient continues to want foley removed.

## 2011-12-23 NOTE — Progress Notes (Signed)
Per patient request spoke to Dr. Abbey Chatters about patient reported blood when wiping after stool, MD agrees this is normal after resection

## 2011-12-24 NOTE — Progress Notes (Signed)
Patient ID: Lawrence Villa, male   DOB: [birth year], 51 y.o.   MRN: [id number]  General Surgery - [facility] - Progress Note  POD# 2  Subjective: Patient up to chair.  Was able to sleep after IV Ativan given.  Foley out - voiding normally.  Objective: Vital signs in last 24 hours: Temp:  [97.8 F (36.6 C)-99 F (37.2 C)] 97.8 F (36.6 C) (08/11 0629) Pulse Rate:  [48-78] 63  (08/11 0629) Resp:  [16-20] 16  (08/11 0629) BP: (124-138)/(70-79) 133/71 mmHg (08/11 0629) SpO2:  [98 %-100 %] 99 % (08/11 0629) Last BM Date: 12/23/11  Intake/Output from previous day: 08/10 0701 - 08/11 0700 In: 3014.6 [I.V.:3014.6] Out: 1090 [Urine:360; Emesis/NG output:600; Drains:130]  Exam: HEENT - clear, not icteric Neck - soft Chest - clear bilaterally Cor - RRR, no murmur Abd - mild distension; quiet, few BS; dressing dry; JP with small serosanguinous Ext - no significant edema Neuro - grossly intact, no focal deficits  Lab Results:   Basename 12/23/11 0515 12/21/11 1015  WBC 11.1* 10.8*  HGB 10.3* 11.0*  HCT 32.0* 33.5*  PLT 497* 434*     Basename 12/23/11 0515 12/21/11 1015  NA 135 132*  K 5.0 4.6  CL 101 93*  CO2 24 30  GLUCOSE 110* 103*  BUN 12 13  CREATININE 0.96 1.06  CALCIUM 8.2* 9.5    Studies/Results: No results found.  Assessment / Plan: 1.  . Status post low anterior resection for rectal carcinoma   - NPO, NG decompression, IV hydration   - Ativan prn sleep per patient request  - OOB, ambulate   - Dr. Derrell Lolling to discuss operative findings with patient on Monday   [name], MD, [facility] Office: [phone]  12/24/2011

## 2011-12-25 ENCOUNTER — Encounter (HOSPITAL_COMMUNITY): Payer: Self-pay | Admitting: General Surgery

## 2011-12-25 NOTE — Progress Notes (Addendum)
3 Days Post-Op  Subjective: Stable. Comfortable. Pain control good. Using IV Ativan to sleep at night. Used to Seroquel at home.  Ambulating in halls. Voiding without difficulty. Says he passed flatus a few times and passed a little bit of blood per rectum. Says he is hungry.  NG drainage moderate, a light bilious  color.  Pathology report pending. I have explained once again that he has numerous bi- lobar liver metastasis which have been biopsied. He is aware that he will need chemotherapy, and he is aware that I would like him to see a medical oncologist. He is in full agreement with that and willing to consider chemotherapy.  Objective: Vital signs in last 24 hours: Temp:  [97.3 F (36.3 C)-98 F (36.7 C)] 97.3 F (36.3 C) (08/12 0450) Pulse Rate:  [60-76] 64  (08/12 0450) Resp:  [16-18] 18  (08/12 0450) BP: (130-150)/(69-79) 130/73 mmHg (08/12 0450) SpO2:  [96 %-100 %] 96 % (08/12 0450) Last BM Date: 12/24/11  Intake/Output from previous day: 08/11 0701 - 08/12 0700 In: 1532 [I.V.:1500; NG/GT:30] Out: 1015 [Emesis/NG output:900; Drains:115] Intake/Output this shift: Total I/O In: 32 [Other:2; NG/GT:30] Out: 450 [Emesis/NG output:400; Drains:50]  General appearance: lower. Oriented. Very talkative. In no distress. GI: soft. Flat. Minimally tender. Bowel sounds present. Wound clean and dry. JP drainage clear serous fluid.  Lab Results:  No results found for this or any previous visit (from the past 24 hour(s)).   Studies/Results: @RISRSLT24 @     . heparin  5,000 Units Subcutaneous Q8H  . HYDROmorphone PCA 0.3 mg/mL   Intravenous Q4H     Assessment/Plan: s/p Procedure(s): LOW ANTERIOR BOWEL RESECTION LIVER BIOPSY  Adenocarcinoma of proximal rectum, status post low anterior resection and liver biopsy. Stable. Discontinue NG tube. Sips. Check pathology reports Check lab work tomorrow.  Medical oncology consultation to arrange outpatient followup.    LOS: 3  days    Vi Whitesel M. Derrell Lolling, M.D., [facility] General and Minimally invasive Surgery Breast and Colorectal Surgery Office:   [phone] Pager:   [phone]  12/25/2011  . .prob

## 2011-12-26 DIAGNOSIS — C772 Secondary and unspecified malignant neoplasm of intra-abdominal lymph nodes: Secondary | ICD-10-CM

## 2011-12-26 DIAGNOSIS — C787 Secondary malignant neoplasm of liver and intrahepatic bile duct: Secondary | ICD-10-CM

## 2011-12-26 DIAGNOSIS — C187 Malignant neoplasm of sigmoid colon: Secondary | ICD-10-CM

## 2011-12-26 LAB — COMPREHENSIVE METABOLIC PANEL
ALT: 17 U/L (ref 0–53)
AST: 27 U/L (ref 0–37)
Albumin: 2.4 g/dL — ABNORMAL LOW (ref 3.5–5.2)
Alkaline Phosphatase: 83 U/L (ref 39–117)
GFR calc Af Amer: >90 mL/min (ref >90)
Glucose, Bld: 100 mg/dL — ABNORMAL HIGH (ref 70–99)
Potassium: 4.2 mEq/L (ref 3.5–5.1)
Sodium: 136 mEq/L (ref 135–145)
Total Protein: 5.5 g/dL — ABNORMAL LOW (ref 6.0–8.3)

## 2011-12-26 LAB — CBC
Hemoglobin: 9.5 g/dL — ABNORMAL LOW (ref 13.0–17.0)
MCHC: 32 g/dL (ref 30.0–36.0)

## 2011-12-26 MED ORDER — HYDROCODONE-ACETAMINOPHEN 5-325 MG PO TABS: 1.0000 | ORAL_TABLET | ORAL | Status: DC | PRN

## 2011-12-26 MED ORDER — MORPHINE SULFATE 2 MG/ML IJ SOLN: 1.0000 mg | INTRAMUSCULAR | Status: DC | PRN

## 2011-12-26 NOTE — Consult Note (Addendum)
ONCOLOGY  HOSPITAL CONSULTATION NOTE  Lawrence Villa                                MR#: [id number]  DOB: 1960-07-07                        CSN#: [id number] Referring MD: Dr. Derrell Lolling ([facility]) GI: Dr. Dulce Sellar at [facility] Primary MD: Dr. Foy Guadalajara  Reason for Consult: Colon Cancer   Lawrence Villa is a 51 y.o. male. asked to see for evaluation of metastatic adenocarcinoma of the colon. In review, the patient noticed intermittent blood in his stools for about 6 months, accompanied by  crampy abdominal pain and unintentional  20 pound weight loss over the last  2 months. Patient underwent colonoscopy on 8/2  (Dr. Dulce Sellar), which revealed a circumferential fungating mass at 15 cm. The lumen was only a few millimeters and endoscope could not be passed through it. Biopsy showed adenocarcinoma.  CEA level is 80.6, liver function test are normal. A CT on 12/18/2011 scan shows a circumferential mass in the mid to distal sigmoid colon and a suggestion of mesenteric adenopathy and numerous liver metastasis . The bowel was not distended. There was no ascites. He was referred to Dr. Derrell Lolling and underwent an exploratory laparotomy, low anterior resection, and wedge resection of a right liver mass on 12/22/2011. A bulky tumor was noted at the proximal rectum, at 13 cm by rigid proctoscopy. Bulky lymph nodes were noted in the mesentery at the sacral promontory. There were numerous by lobar liver metastases.  Pathology confirmed metastatic colon cancer involving the liver. The primary tumor was a moderately differentiated adenocarcinoma extending through the muscularis propria and focally involved the serosa. 18 of 20 lymph nodes contained metastatic carcinoma.  He is recovering from surgery. He is ambulating and reports his bowels are functioning. Marland Kitchen   PMH:  Past Medical History  Diagnosis Date  . Hyperlipidemia   . Insomnia   . Anemia     Surgeries:  Past Surgical History  Procedure Date  . Gingivectomy 2006    . Bowel resection 12/22/2011    Procedure: LOW ANTERIOR BOWEL RESECTION;  Surgeon: [name], MD;  Location: [facility];  Service: General;  Laterality: N/A;  . Liver biopsy 12/22/2011    Procedure: LIVER BIOPSY;  Surgeon: [name], MD;  Location: [facility];  Service: General;  Laterality: N/A;  wedge    Allergies: No Known Allergies  Medications:   Prior to Admission:  Prescriptions prior to admission  Medication Sig Dispense Refill  . Ferrous Sulfate (IRON) 325 (65 FE) MG TABS Take 1 tablet by mouth daily.       . Flaxseed, Linseed, (FLAX SEED OIL PO) Take 1 tablet by mouth daily.       . Glucosamine-Chondroit-Vit C-Mn (GLUCOSAMINE 1500 COMPLEX PO) Take 1 tablet by mouth daily.       . Lecithin 1200 MG CAPS Take 1 capsule by mouth daily.       Marland Kitchen Lawrence Villa (ATIVAN) 0.5 MG tablet Take 0.5 mg by mouth at bedtime as needed. sleep      . Multiple Vitamin (MULTIVITAMIN WITH MINERALS) TABS Take 1 tablet by mouth daily.      . polyethylene glycol-electrolytes (NULYTELY/GOLYTELY) 420 G solution Take 4,000 mLs by mouth once.      Marland Kitchen QUEtiapine (SEROQUEL) 25 MG tablet Take 25 mg by  mouth at bedtime.      . vitamin E 400 UNIT capsule Take 400 Units by mouth daily.        ZOX:[id number], Lawrence Villa, Lawrence Villa, ondansetron (ZOFRAN) IV, ondansetron, DISCONTD: diphenhydrAMINE, DISCONTD: diphenhydrAMINE, DISCONTD: naloxone, DISCONTD: ondansetron (ZOFRAN) IV, DISCONTD: sodium chloride  ROS: Constitutional: Positive unintentional  20 lb  weight loss. Negative for fever, chills and malaise/fatigue.  Eyes: Negative for blurred vision and double vision.  Respiratory: Negative for cough. No hemoptysis. No shortness of breath. No pleuritic chest pain.  Cardiovascular: Negative for chest pain. No palpitations.  GI: intermittent nausea, no  vomiting, diarrhea, constipation. No change in bowel caliber. No  Melena. He did notice blood in the stools over the last 6 months.   Positive  for   RUQ pain prior to surgery.  GU: No blood in urine. No loss of urinary control. Skin: Negative for itching. No rash. No petechia. No easy  bruising Neurological: No headaches. No motor or sensory deficits. Insomnia.  Psych: Anxiety.   Family History:    Family History  Problem Relation Age of Onset  . Heart attack Paternal Grandfather     No family history of colon cancer except for one uncle with possible "abdominal cancer".   Social History:  Patient is divorced and has 2 daughters. 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 reports that he has never smoked. He has never used smokeless tobacco. He reports that he does not drink alcohol or use illicit drugs.patient lives in  [place]. . No risk factor for HIV or hepatitis.  Physical Exam    Filed Vitals:   12/26/11 0629  BP: 123/74  Pulse: 59  Temp: 97.2 F (36.2 C)  Resp: 18      General:  74 -year-old  in no acute distress A. and O. x3  well-developed and well-nourished.  HEENT: Normocephalic, atraumatic, PERRLA. Oral cavity without thrush or lesions. Neck without mass Lungs clear bilaterally . No wheezing, rhonchi or rales. No axillary masses. Cardiac regular rate and rhythm normal S1-S2, no murmur , rubs or gallops Abdomen tender at RUQ,? Palpable liver edge in the right upper abdomen GU-testes without mass Extremities no clubbing cyanosis or edema. No bruising or petechial  Musculoskeletal: no spinal tenderness.  Neuro: Non Focal  Labs:  CBC   Lab 12/26/11 0422 12/23/11 0515 12/21/11 1015  WBC 7.1 11.1* 10.8*  HGB 9.5* 10.3* 11.0*  HCT 29.7* 32.0* 33.5*  PLT 456* 497* 434*  MCV 79.8 80.8 79.8  MCH 25.5* 26.0 26.2  MCHC 32.0 32.2 32.8  RDW 14.6 14.4 14.5  LYMPHSABS -- -- --  MONOABS -- -- --  EOSABS -- -- --  BASOSABS -- -- --  BANDABS -- -- --     CMP    Lab 12/26/11 0422 12/23/11 0515 12/21/11 1015  NA 136 135 132*  K 4.2 5.0 4.6  CL 104 101 93*  CO2 24 24 30     GLUCOSE 100* 110* 103*  BUN 6 12 13   CREATININE 0.77 0.96 1.06  CALCIUM 8.2* 8.2* 9.5  MG -- -- --  AST 27 -- 49*  ALT 17 -- 24  ALKPHOS 83 -- 126*  BILITOT 0.3 -- 0.6        Component Value Date/Time   BILITOT 0.3 12/26/2011 0422     CEA on 12/21/2011-71.6  Imaging Studies:  Ct Chest W Contrast  12/18/2011  CT CHEST  Findings:  The lungs are clear.  No suspicious pulmonary nodules. No pleural effusion or pneumothorax.  Visualized thyroid is unremarkable.  The heart is normal in size.  No pericardial effusion.  No suspicious mediastinal, hilar, or axillary lymphadenopathy.  Mild degenerative changes of the thoracic spine.  IMPRESSION: No evidence of metastatic disease in the chest.   Ct Abdomen Pelvis W Contrast  12/18/2011  Findings:  Numerous hepatic metastases, approximately 2025 in number.  Representative lesions include: --5.4 x 5.4 cm lesion in the right hepatic dome (series 2/image 50) --5.2 x 5.2 cm lesion in the medial segment left hepatic lobe (series 2/image 56) --1.5 x 2.0 cm lesion in the lateral segment left hepatic lobe (series 2/57) --4.3 x 3.9 cm lesion in the anterior segment right hepatic lobe (series 2/image 61) --8.5 x 9.6 cm lesion in the posterior segment right hepatic lobe (series 2/image 71)  Spleen, pancreas, and adrenal glands are within normal limits.  Gallbladder is underdistended.  No intrahepatic or extrahepatic ductal dilatation.  Kidneys are within normal limits.  No hydronephrosis.  No evidence of bowel obstruction.  Normal appendix.  Colonic mass/apple-core lesion in the sigmoid colon, measuring approximately 4 cm in length (series 2/image 110), corresponding to suspected colonic adenocarcinoma.  Associated 3.9 x 4.3 cm soft tissue lesion in the sigmoid mesocolon (series 2/image 106), compatible with extracolonic spread. Additional mesenteric nodes in the left abdomen measuring up to 10 mm short axis (series 2/image 98 and 100), reflecting nodal metastases.   No abdominopelvic ascites.  Prostate is unremarkable.  Bladder is within normal limits.  Very mild degenerative changes of the lumbar spine.  IMPRESSION: Colonic mass/apple-core lesion in the sigmoid colon, corresponding to suspected colonic adenocarcinoma.  4.3 cm soft tissue lesion in the sigmoid mesial colon, compatible with extra colonic spread.  Associated nodal metastases in the left abdominal mesentery.  Numerous hepatic metastases, as described above, measuring up to 9.6 cm.  Original Report Authenticated By: Charline Bills, M.D.      A/P: 51 y.o. male newly  diagnosed with  T4a, N2b,M1 adenocarcinoma of the colon, 18/20 lymph nodes positive and  liver biopsy  positive for adenocarcinoma. Dr. Truett Perna  is to see the patient following this consult with recommendations regarding diagnosis, treatment options and further workup studies prior to his discharge from the hospital.  Thank you for the referral.  [facility] E 12/26/2011 8:31 AM  I interviewed and examined Mr. Wenke. I reviewed the preoperative CT.  Impression:  1. Metastatic colon cancer, status post a low anterior resection on 12/22/2011, (T4, N2, M1) tumor  2. Microcytic anemia  He has been diagnosed with metastatic colon cancer. I discussed the diagnosis and treatment options with Mr. Carneiro. He appears to have multiple bilateral liver metastases and will not be a candidate for a liver resection procedure. He understands no therapy will be curative.  I discussed the significant chance of obtaining a partial clinical remission with systemic therapy. I will recommend systemic therapy to start within the next several weeks. We will consider "standard" FOLFOX/Avastin versus enrollment on the MAVERICC study. This study randomizes patients to FOLFIRI/Avastin versus FOLFOX/Avastin.  Recommendations: 1. Port-A-Cath placement for chemotherapy 2. Outpatient followup at the cancer Center in approximately 2 weeks. 3. Iron replacement  therapy when taking by mouth.

## 2011-12-26 NOTE — Progress Notes (Addendum)
4 Days Post-Op  Subjective: Alert. Stable. Minimal pain. Ambulatory. No stool. Rare flatus. No nausea. Tolerating sips of clear liquids.  Labs show hemoglobin 9.5, WBC 7.1, albumin 2.4, normal liver function tests. Potassium 4.2. 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 18/20 lymph nodes showed metastatic disease. Wedge biopsy of liver positive for adenocarcinoma, as expected. I discussed these results with the patient.  I have discussed the patient's care with  Dr. Mancel Bale who is going to see the patient. I told the patient that he would eventually need a Port-A-Cath placed and I talked about this procedure with him. He seems to understand this well.  He does, however, remain anxious which is a chronic problem. He takes Seroquel at home. Ativan is working good as a substitute until we reestablish diet.  Objective: Vital signs in last 24 hours: Temp:  [97.4 F (36.3 C)-97.9 F (36.6 C)] 97.9 F (36.6 C) (08/12 2216) Pulse Rate:  [66-71] 71  (08/12 2216) Resp:  [18] 18  (08/12 2216) BP: (108-122)/(61-70) 108/61 mmHg (08/12 2216) SpO2:  [98 %-99 %] 98 % (08/12 2216) Last BM Date: 12/24/11  Intake/Output from previous day: 08/12 0701 - 08/13 0700 In: 860 [P.O.:360; I.V.:500] Out: 2175 [Urine:2100; Drains:75] Intake/Output this shift: Total I/O In: 500 [I.V.:500] Out: 1335 [Urine:1300; Drains:35]  General appearance: he is ambulating in the room, looks comfortable in no distress. Mental status normal other than mild anxiety. GI: abdomen is very soft. Almost nontender. Bowel sounds are present. Doesn't appear distended. JP drainage is serous. Midline wound looks good.  Lab Results:  Results for orders placed during the hospital encounter of 12/22/11 (from the past 24 hour(s))  CBC     Status: Abnormal   Collection Time   12/26/11  4:22 AM      Component Value Range   WBC 7.1  4.0 - 10.5 K/uL   RBC 3.72 (*) 4.22 - 5.81 MIL/uL   Hemoglobin 9.5  (*) 13.0 - 17.0 g/dL   HCT 13.2 (*) 44.0 - 10.2 %   MCV 79.8  78.0 - 100.0 fL   MCH 25.5 (*) 26.0 - 34.0 pg   MCHC 32.0  30.0 - 36.0 g/dL   RDW 72.5  36.6 - 44.0 %   Platelets 456 (*) 150 - 400 K/uL  COMPREHENSIVE METABOLIC PANEL     Status: Abnormal   Collection Time   12/26/11  4:22 AM      Component Value Range   Sodium 136  135 - 145 mEq/L   Potassium 4.2  3.5 - 5.1 mEq/L   Chloride 104  96 - 112 mEq/L   CO2 24  19 - 32 mEq/L   Glucose, Bld 100 (*) 70 - 99 mg/dL   BUN 6  6 - 23 mg/dL   Creatinine, Ser 3.47  0.50 - 1.35 mg/dL   Calcium 8.2 (*) 8.4 - 10.5 mg/dL   Total Protein 5.5 (*) 6.0 - 8.3 g/dL   Albumin 2.4 (*) 3.5 - 5.2 g/dL   AST 27  0 - 37 U/L   ALT 17  0 - 53 U/L   Alkaline Phosphatase 83  39 - 117 U/L   Total Bilirubin 0.3  0.3 - 1.2 mg/dL   GFR calc non Af Amer >90  >90 mL/min   GFR calc Af Amer >90  >90 mL/min     Studies/Results: @RISRSLT24 @     . heparin  5,000 Units Subcutaneous Q8H  . DISCONTD: HYDROmorphone  PCA 0.3 mg/mL   Intravenous Q4H     Assessment/Plan: s/p Procedure(s): LOW ANTERIOR BOWEL RESECTION LIVER BIOPSY  POD #4. Stable. Ileus not resolved yet. Await return of bowel function. 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  Medical oncology consult today to arrange outpatient followup  The patient knows he will eventually need Port-A-Cath.  Anxiety, controlled    LOS: 4 days    Carlon Davidson M. Derrell Lolling, M.D., [facility] General and Minimally invasive Surgery Breast and Colorectal Surgery Office:   [phone] Pager:   [phone]  12/26/2011  . .prob

## 2011-12-27 ENCOUNTER — Other Ambulatory Visit (INDEPENDENT_AMBULATORY_CARE_PROVIDER_SITE_OTHER): Payer: Self-pay | Admitting: General Surgery

## 2011-12-27 MED ORDER — ALUM & MAG HYDROXIDE-SIMETH 200-200-20 MG/5ML PO SUSP: 30.0000 mL | Freq: Four times a day (QID) | ORAL | Status: DC | PRN

## 2011-12-27 MED ORDER — LORAZEPAM 0.5 MG PO TABS
0.5000 mg | ORAL_TABLET | Freq: Every day | ORAL | Status: DC
  Administered 2011-12-27: 0.5 mg via ORAL
  Filled 2011-12-27: qty 1

## 2011-12-27 MED ORDER — POTASSIUM CHLORIDE IN NACL 20-0.9 MEQ/L-% IV SOLN
50.0000 mL/h | INTRAVENOUS | Status: DC
  Administered 2011-12-27: 50 mL/h via INTRAVENOUS
  Filled 2011-12-27 (×2): qty 1000

## 2011-12-27 MED ORDER — ACETAMINOPHEN 325 MG PO TABS: 325.0000 mg | ORAL_TABLET | Freq: Four times a day (QID) | ORAL | Status: DC | PRN

## 2011-12-27 NOTE — Progress Notes (Addendum)
Dilaudid PCA discontinued per doctor 's order.  Pharmacy notified.  21 ml of dilaudid wasted in the sink. Witnessed  By Hayden Pedro, RN.

## 2011-12-27 NOTE — Progress Notes (Signed)
5 Days Post-Op  Subjective:  I appreciate Dr. Daivd Council evaluation and treatment plan and advice. I will schedule Port-A-Cath insertion in the next 2 weeks. I discussed the indications and details of this procedure with the patient.  The patient is doing fairly well. He has had cervical loose stools and passing some flatus. No nausea or pain. Was to switch to oral lorazepam for sleep. He states he does not want to restore the Seroquel at this time. He is ambulatory. Voiding well. Seems to understand the issues surrounding the need for adjuvant chemotherapy and the stage of his disease.  Objective: Vital signs in last 24 hours: Temp:  [97.2 F (36.2 C)-97.8 F (36.6 C)] 97.8 F (36.6 C) (08/13 2100) Pulse Rate:  [59-64] 64  (08/13 2100) Resp:  [16-18] 16  (08/13 2100) BP: (118-123)/(74-78) 118/78 mmHg (08/13 2100) SpO2:  [96 %-98 %] 98 % (08/13 2100) Last BM Date: 12/27/11  Intake/Output from previous day: 08/13 0701 - 08/14 0700 In: 540 [P.O.:300] Out: 210 [Urine:100; Drains:110] Intake/Output this shift: Total I/O In: 240 [Other:240] Out: 60 [Drains:60]  General appearance: alert. In no distress. Mental status normal. Anxious. Lots of questions. Resp: clear to auscultation bilaterally GI: abdomen soft. Active bowel sounds. Midline incision clean. JP drain is serous, clear. Borderline distended.  Lab Results:  No results found for this or any previous visit (from the past 24 hour(s)).   Studies/Results: @RISRSLT24 @     . heparin  5,000 Units Subcutaneous Q8H  . LORazepam  0.5 mg Oral QHS  . DISCONTD: HYDROmorphone PCA 0.3 mg/mL   Intravenous Q4H     Assessment/Plan: s/p Procedure(s): LOW ANTERIOR BOWEL RESECTION LIVER BIOPSYt. Await return of bowel function.   POD #5. Stable. Ileus resolving.  Advance to full liquids today Decrease IV rate  Decrease narcotics.  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   Dr. Truett Perna has seen and plans outpatient follow up. Will  schedule Port-A-Cath insertion as an outpatient. I discussed the indications, details, techniques, and numerous risks of this procedure with the patient. He understands these issues and his questions were answered. He agrees with this plan.   Anxiety, controlled. Switch to by mouth lorazepam for sleep.  Hopefully we can advance him to a regular diet tomorrow, and discharge home Friday and remove the drain at that time. I will be out of town for the next 4 days. My partners will see him in my absence. He is aware of this. He will follow up with me in the office next week.     LOS: 5 days    Aqib Lough M. Derrell Lolling, M.D., [facility] General and Minimally invasive Surgery Breast and Colorectal Surgery Office:   [phone] Pager:   [phone]  12/27/2011  . .prob

## 2011-12-28 ENCOUNTER — Telehealth: Payer: Self-pay | Admitting: Oncology

## 2011-12-28 ENCOUNTER — Encounter (HOSPITAL_COMMUNITY): Payer: Self-pay | Admitting: Surgery

## 2011-12-28 DIAGNOSIS — G4700 Insomnia, unspecified: Secondary | ICD-10-CM | POA: Diagnosis present

## 2011-12-28 DIAGNOSIS — C787 Secondary malignant neoplasm of liver and intrahepatic bile duct: Secondary | ICD-10-CM | POA: Diagnosis present

## 2011-12-28 DIAGNOSIS — D649 Anemia, unspecified: Secondary | ICD-10-CM | POA: Diagnosis present

## 2011-12-28 MED ORDER — SODIUM CHLORIDE 0.9 % IJ SOLN: 3.0000 mL | Freq: Two times a day (BID) | INTRAMUSCULAR | Status: DC

## 2011-12-28 MED ORDER — FERROUS SULFATE 325 (65 FE) MG PO TABS
325.0000 mg | ORAL_TABLET | Freq: Every day | ORAL | Status: DC
  Administered 2011-12-28: 325 mg via ORAL
  Filled 2011-12-28: qty 1

## 2011-12-28 MED ORDER — VITAMIN E 180 MG (400 UNIT) PO CAPS
400.0000 [IU] | ORAL_CAPSULE | Freq: Every day | ORAL | Status: DC
  Administered 2011-12-28: 400 [IU] via ORAL
  Filled 2011-12-28: qty 1

## 2011-12-28 MED ORDER — OXYCODONE HCL 5 MG PO TABS: 5.0000 mg | ORAL_TABLET | ORAL | Status: DC | PRN

## 2011-12-28 MED ORDER — ADULT MULTIVITAMIN W/MINERALS CH
1.0000 | ORAL_TABLET | Freq: Every day | ORAL | Status: DC
  Administered 2011-12-28: 1 via ORAL
  Filled 2011-12-28: qty 1

## 2011-12-28 MED ORDER — SODIUM CHLORIDE 0.9 % IJ SOLN: 3.0000 mL | INTRAMUSCULAR | Status: DC | PRN

## 2011-12-28 NOTE — Discharge Summary (Signed)
Physician Discharge Summary  Patient ID: Lawrence Villa MRN: [id number] DOB/AGE: 20-Nov-1960 51 y.o.  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  Underwent resection.  Found to have multiple liver metastases.  Biopsy confirmed this.  Postoperatively he gradually advanced on his diet.  He mobilized.  He began to have flatus and bowel movements.  He was transitioned off IV fluids.  His pain was well-controlled.  Medical oncology consultation was made.  I recommended post adjuvant chemotherapy.  Dr. Derrell Lolling is anticipating doing a Port-A-Cath placement in a few weeks once he is recovered from his abdominal surgery.  By the time of discharge, the patient was walking well the hallways, eating food well, having flatus.  Pain was-controlled on an oral regimen.  Based on meeting DC criteria and recovering well, I felt it was safe for the patient to be discharged home with close followup.  Instructions were discussed in detail.  They are written as well.     Consults: Med oncology  Significant Diagnostic Studies: labs: CEA, CT scan  Treatments: surgery:   Discharge Exam: Blood pressure 124/76, pulse 62, temperature 98.1 F (36.7 C), temperature source Oral, resp. rate 16, height 6' (1.829 m), weight 184 lb (83.462 kg), SpO2 98.00%.  General: Pt awake/alert/oriented x4 in no major acute distress Eyes:  PERRL, normal EOM. Sclera nonicteric Neuro: CN II-XII intact w/o focal sensory/motor deficits. Lymph: No head/neck/groin lymphadenopathy Psych:  No delerium/psychosis/paranoia HENT: Normocephalic, Mucus membranes moist.  No thrush Neck: Supple, No tracheal deviation Chest: No pain.  Good respiratory excursion. CV:  Pulses intact.  Regular rhythm Abdomen: Soft, Nondistended.  Min tender at incision.  Drain serosanguinous.  No incarcerated hernias. Ext:  SCDs BLE.  No significant edema.  No cyanosis Skin: No petechiae / purpurae   Disposition: Final discharge disposition not confirmed  Discharge Orders    Future Orders Please Complete By Expires   Diet - low sodium heart healthy      Increase activity slowly        Medication List  As of 12/28/2011  6:23 AM   STOP taking these medications         polyethylene glycol-electrolytes 420 G solution         TAKE these medications         FLAX SEED OIL PO   Take 1 tablet by mouth daily.      GLUCOSAMINE 1500 COMPLEX PO   Take 1 tablet by mouth daily.      Iron 325 (65 FE) MG Tabs   Take 1 tablet by mouth daily.      Lecithin 1200 MG Caps   Take 1 capsule by mouth daily.      LORazepam 0.5 MG tablet   Commonly known as: ATIVAN   Take 0.5 mg by mouth at bedtime as needed. sleep      multivitamin with minerals Tabs   Take 1 tablet by mouth daily.      oxyCODONE 5 MG immediate release tablet  Commonly known as: Oxy IR/ROXICODONE   Take 1-2 tablets (5-10 mg total) by mouth every 4 (four) hours as needed for pain.      QUEtiapine 25 MG tablet   Commonly known as: SEROQUEL   Take 25 mg by mouth at bedtime.      vitamin E 400 UNIT capsule   Take 400 Units by mouth daily.           Follow-up Information    Follow up with [name], MD in 1 week. (to remove staples & discuss port-a cath placement for chemotherapy)    Contact information:   [address] [place]  [address] [phone]          Signed: Ardeth Sportsman. 12/28/2011, 6:23 AM

## 2011-12-28 NOTE — Telephone Encounter (Signed)
LVM for pt to return call for hosp f/u appt.

## 2011-12-28 NOTE — Telephone Encounter (Signed)
Confirmed d/t of hosp f/u appt on 8/20.

## 2011-12-29 ENCOUNTER — Telehealth: Payer: Self-pay | Admitting: Oncology

## 2011-12-29 NOTE — Telephone Encounter (Signed)
Delivered chart °

## 2012-01-01 ENCOUNTER — Encounter (HOSPITAL_COMMUNITY): Payer: Self-pay | Admitting: Pharmacy Technician

## 2012-01-02 ENCOUNTER — Encounter: Payer: Self-pay | Admitting: *Deleted

## 2012-01-02 ENCOUNTER — Telehealth: Payer: Self-pay | Admitting: Oncology

## 2012-01-02 ENCOUNTER — Ambulatory Visit (HOSPITAL_BASED_OUTPATIENT_CLINIC_OR_DEPARTMENT_OTHER): Payer: Federal, State, Local not specified - PPO | Admitting: Oncology

## 2012-01-02 ENCOUNTER — Other Ambulatory Visit: Payer: Self-pay | Admitting: *Deleted

## 2012-01-02 ENCOUNTER — Ambulatory Visit: Payer: Federal, State, Local not specified - PPO

## 2012-01-02 VITALS — BP 115/77 | HR 76 | Temp 96.8°F | Resp 18 | Ht 72.0 in | Wt 177.3 lb

## 2012-01-02 DIAGNOSIS — C187 Malignant neoplasm of sigmoid colon: Secondary | ICD-10-CM

## 2012-01-02 DIAGNOSIS — C19 Malignant neoplasm of rectosigmoid junction: Secondary | ICD-10-CM

## 2012-01-02 DIAGNOSIS — C772 Secondary and unspecified malignant neoplasm of intra-abdominal lymph nodes: Secondary | ICD-10-CM

## 2012-01-02 DIAGNOSIS — C787 Secondary malignant neoplasm of liver and intrahepatic bile duct: Secondary | ICD-10-CM

## 2012-01-02 NOTE — Telephone Encounter (Signed)
Gave pt appt for ML and chemo class. Pt does not want to set up appt for nutritionist and social worker.

## 2012-01-02 NOTE — Progress Notes (Signed)
   [facility]    OFFICE PROGRESS NOTE   INTERVAL HISTORY:   [name] returns as scheduled. He is recovering from surgery. His appetite has improved and he reports good bowel and bladder function.  He reports feeling much better compared to when he was admitted for surgery. He complains of a dry mouth. He is drinking adequate fluids.  Lawrence Villa presents with his mother and sister to discuss systemic treatment options. He is scheduled for a Port-A-Cath placement on 01/10/2012.  Objective:  Vital signs in last 24 hours:  Blood pressure 115/77, pulse 76, temperature 96.8 F (36 C), temperature source Oral, resp. rate 18, height 6' (1.829 m), weight 177 lb 4.8 oz (80.423 kg).    HEENT: The mucous membranes are moist, no thrush Resp: Lungs clear bilaterally Cardio: Regular rate and rhythm GI: The abdomen is soft, healing midline incision with staples in place, the liver edge is palpable in the right upper lateral abdomen Vascular: No leg edema   Lab Results:  Lab Results  Component Value Date   WBC 7.1 12/26/2011   HGB 9.5* 12/26/2011   HCT 29.7* 12/26/2011   MCV 79.8 12/26/2011   PLT 456* 12/26/2011      Medications: I have reviewed the patient's current medications.  Assessment/Plan:  1. Metastatic colon cancer, status post a low anterior resection on 12/22/2011 (T4, N2, M1)  2. Microcytic anemia-maintained on iron  3. Weight loss  Disposition:  Lawrence Villa has been diagnosed with metastatic colon cancer. I discussed the diagnosis, prognosis, and treatment options with the patient and his family. He understand no therapy will be curative. I recommend treatment with systemic therapy. We discussed "standard" treatment with FOLFOX/Avastin or FOLFIRI/Avastin. We reviewed the MAVERICC study which randomizes patients to one of these regimens. He met with a research nurse today and will consider enrollment on the clinical trial.  I reviewed the potential  toxicities associated with the above chemotherapy regimens including the neuropathy associated with oxaliplatin and diarrhea/alopecia associated with irinotecan. We discussed the delayed wound healing, allergic reaction, proteinuria, bleeding, hypertension, bowel perforation, and increased risk of venous/arterial thromboembolic disease with Avastin.  He is scheduled for placement of a Port-A-Cath on 01/10/2012. He will return for an office visit and further discussion on 01/11/2012. He will be scheduled for a chemotherapy teaching class on the same day. His initial indication is that he is interested in enrolling on the MAVERICC study.   Thornton Papas, MD  01/02/2012  5:50 PM

## 2012-01-02 NOTE — Progress Notes (Signed)
Met with patient and family, introduced self and role of GI Navigator.  Resource information given on colon cancer along with names and contact phone numbers.  Chemotherapy regimens reviewed and written education sheets were given to patient.  Referrals made to dietician and SW.  Encouraged patient to call as needed for assist.  Will continue to follow.

## 2012-01-02 NOTE — Progress Notes (Signed)
[facility] Brief Psychosocial Assessment Clinical Social Work  Clinical Social Work was referred by patient navigator for assessment of psychosocial needs.  Clinical Child psychotherapist met with patient, patient's mother, and patient's sister in the exam room at [facility] to offer support and assess for needs.  Pt seen after initial visit with Dr. Truett Perna after a recent diagnosis of colon cancer.  Pt's family was emotional during visit, and CSW validated the pt and family's feelings.  CSW informed pt and family of the [facility] support team and support services.  CSW, pt, and pt's family discussed the importance of emotional support during diagnosis and treatment.  CSW offered additional support and encouraged pt and family to call with any needs or concerns.  Pt and family were appreciative of CSW contact and support services information.         Tamala Julian, MSW, LCSW Clinical Social Worker [facility] [phone]

## 2012-01-03 ENCOUNTER — Ambulatory Visit: Payer: Federal, State, Local not specified - PPO | Admitting: Nutrition

## 2012-01-03 ENCOUNTER — Other Ambulatory Visit: Payer: Self-pay | Admitting: *Deleted

## 2012-01-03 ENCOUNTER — Other Ambulatory Visit (HOSPITAL_COMMUNITY)
Admission: RE | Admit: 2012-01-03 | Discharge: 2012-01-03 | Disposition: A | Discharge status: Home / Self Care | Payer: Federal, State, Local not specified - PPO | Source: Ambulatory Visit | Attending: Internal Medicine | Admitting: Internal Medicine

## 2012-01-03 ENCOUNTER — Telehealth: Payer: Self-pay | Admitting: *Deleted

## 2012-01-03 DIAGNOSIS — C189 Malignant neoplasm of colon, unspecified: Secondary | ICD-10-CM | POA: Insufficient documentation

## 2012-01-03 DIAGNOSIS — C19 Malignant neoplasm of rectosigmoid junction: Secondary | ICD-10-CM

## 2012-01-03 NOTE — Telephone Encounter (Signed)
Per staff message and POF I have scheduled appt.  JMW  

## 2012-01-04 ENCOUNTER — Encounter: Payer: Self-pay | Admitting: *Deleted

## 2012-01-04 ENCOUNTER — Encounter (INDEPENDENT_AMBULATORY_CARE_PROVIDER_SITE_OTHER): Payer: Self-pay | Admitting: General Surgery

## 2012-01-04 ENCOUNTER — Ambulatory Visit (INDEPENDENT_AMBULATORY_CARE_PROVIDER_SITE_OTHER): Payer: Federal, State, Local not specified - PPO | Admitting: General Surgery

## 2012-01-04 ENCOUNTER — Other Ambulatory Visit: Payer: Federal, State, Local not specified - PPO

## 2012-01-04 VITALS — BP 118/78 | HR 82 | Temp 97.3°F | Ht 72.0 in | Wt 176.4 lb

## 2012-01-04 DIAGNOSIS — C19 Malignant neoplasm of rectosigmoid junction: Secondary | ICD-10-CM

## 2012-01-04 NOTE — Assessment & Plan Note (Signed)
Lawrence Villa is a 51 year old male patient of Dr. Kalman Drape diagnosed with metastatic colon cancer, status post low anterior resection.  HISTORY:  Includes hyperlipidemia, insomnia, anemia, and constipation.  MEDICATIONS INCLUDE:  Flaxseed oil, glucosamine, iron, Ativan, multivitamin, Seroquel, vitamin E, and magnesium.  LABORATORIES:  Include glucose of 100, albumin of 2.4 on August 13th.  HEIGHT:  6 feet. WEIGHT:  177.3 pounds on August 20th. USUAL BODY WEIGHT:  208 pounds approximately 2 months ago. BMI:  24.04.  The patient reports he is feeling well status post his surgery.  He states he has a poor appetite and complains of dry mouth.  He is eating less than 50% of what he normally eats.  He feels like his appetite is getting better.  He is preparing for chemotherapy.   NUTRITION DIAGNOSIS:  Unintended weight loss related to new diagnosis of metastatic colon cancer as evidenced by a 20-pound weight loss.    The patient meets criteria for severe malnutrition in the context of acute illness or injury secondary to a 15% weight loss in 2 months and less than 50% of estimated energy requirements for greater than 5 days.  INTERVENTION:  I educated the patient, mother, and sister on the importance of diet as tolerated postop, slowly and gradually adding in fiber as tolerated, to consume adequate protein and calories for weight maintenance.  I have encouraged him to consume a healthy plant-based diet when he is able.  I stressed the importance of adequate protein and calories for healing and minimizing further weight loss.  I have provided the patient with suggestions on dealing with dry mouth and encouraged the patient to try oral nutrition supplements to provide him with some additional calories.  I have given him fact sheets to take with him today and I have answered questions.  MONITORING/EVALUATION (GOALS):  The patient will tolerate increased oral intake to promote continued healing and weight  maintenance.  NEXT VISIT:  Tuesday, September 10th during chemotherapy.   ______________________________ Zenovia Jarred, RD, CSO, LDN Clinical Nutrition Specialist BN/MEDQ  D:  01/03/2012  T:  01/04/2012  Job:  [id number]

## 2012-01-04 NOTE — Progress Notes (Signed)
Patient ID: Lawrence Villa, male   DOB: [birth year], 51 y.o.   MRN: [id number]  History: This gentleman has a stage IV cancer of the proximal rectum. He underwent low anterior resection with stapled anastomosis and liver biopsy on 12/22/2011. 18 of 20 nodes were positive. Multiple liver metastasis. Pathologic stage T4, N2, M1. He has seen Dr. Truett Perna who plans chemotherapeutic protocol to start within 28 days of the surgery which I think is reasonable. He has met with Dr. Truett Perna twice now. Port-A-Cath insertion is scheduled for August 28. I discussed the indications and details and risks of Port-A-Cath insertion with him and his family today. Clinically he is doing well. His appetite is picking up. He is having 2 formed bowel movements per day. He feels better than he did preop. He sees no blood. He is voiding uneventfully. But rare nausea, no vomiting.  Exam: Patient looks well. In no distress. Sister and mother with him. Weight 176 lbs. Abdomen soft and nontender. No distention. Midline wound healing normally. Staples removed.  Assessment: Adenocarcinoma of the proximal rectum, stage T4, N2, M1. ,  Recovering uneventfully following low anterior resection with stapled anastomosis and liver biopsy.  Plan: Port-A-Cath insertion scheduled as outpatient August 28. Followup with me in office for postop check in 6 weeks. His eyes to adhere to a high fiber, low fat diet, encourage hydration, supplemental fiber,. Diet and activities discussed in detail. I discussed the indications and details of and risks of Port-A-Cath insertion in detail. He understands these issues. His questions are answered. He agrees with this plan.   Angelia Mould. Derrell Lolling, M.D., [facility] General and Minimally invasive Surgery Breast and Colorectal Surgery Office:   [phone] Pager:   [phone]

## 2012-01-04 NOTE — Patient Instructions (Signed)
You are recovering from your colorectal surgery without any obvious complications. You are advised to stay well hydrated, follow a low fat, high fiber diet, and take supplemental fiber if necessary. You are advised to increase activities with daily walks up to 2 miles a day. No sports or heavy lifting more than 20 pounds for 5 weeks from the date of surgery. You may return to work, light duty only, in 10-14 days.  You are scheduled for Port-A-Cath insertion on August 28 by Dr. Derrell Lolling.  Bonita Quin will be given an appointment to return for an office visit with Dr. Derrell Lolling in approximately 6 weeks.    Implanted Port Instructions An implanted port is a central line that has a round shape and is placed under the skin. It is used for long-term IV (intravenous) access for:  Medicine.   Fluids.   Liquid nutrition, such as TPN (total parenteral nutrition).   Blood samples.  Ports can be placed:  In the chest area just below the collarbone (this is the most common place.)   In the arms.   In the belly (abdomen) area.   In the legs.  PARTS OF THE PORT A port has 2 main parts:  The reservoir. The reservoir is round, disc-shaped, and will be a small, raised area under your skin.   The reservoir is the part where a needle is inserted (accessed) to either give medicines or to draw blood.   The catheter. The catheter is a long, slender tube that extends from the reservoir. The catheter is placed into a large vein.   Medicine that is inserted into the reservoir goes into the catheter and then into the vein.  INSERTION OF THE PORT  The port is surgically placed in either an operating room or in a procedural area (interventional radiology).   Medicine may be given to help you relax during the procedure.   The skin where the port will be inserted is numbed (local anesthetic).   1 or 2 small cuts (incisions) will be made in the skin to insert the port.   The port can be used after it has been  inserted.  INCISION SITE CARE  The incision site may have small adhesive strips on it. This helps keep the incision site closed. Sometimes, no adhesive strips are placed. Instead of adhesive strips, a special kind of surgical glue is used to keep the incision closed.   If adhesive strips were placed on the incision sites, do not take them off. They will fall off on their own.   The incision site may be sore for 1 to 2 days. Pain medicine can help.   Do not get the incision site wet. Bathe or shower as directed by your caregiver.   The incision site should heal in 5 to 7 days. A small scar may form after the incision has healed.  ACCESSING THE PORT Special steps must be taken to access the port:  Before the port is accessed, a numbing cream can be placed on the skin. This helps numb the skin over the port site.   A sterile technique is used to access the port.   The port is accessed with a needle. Only "non-coring" port needles should be used to access the port. Once the port is accessed, a blood return should be checked. This helps ensure the port is in the vein and is not clogged (clotted).   If your caregiver believes your port should remain accessed, a clear (transparent)  bandage will be placed over the needle site. The bandage and needle will need to be changed every week or as directed by your caregiver.   Keep the bandage covering the needle clean and dry. Do not get it wet. Follow your caregiver's instructions on how to take a shower or bath when the port is accessed.   If your port does not need to stay accessed, no bandage is needed over the port.  FLUSHING THE PORT Flushing the port keeps it from getting clogged. How often the port is flushed depends on:  If a constant infusion is running. If a constant infusion is running, the port may not need to be flushed.   If intermittent medicines are given.   If the port is not being used.  For intermittent medicines:  The port  will need to be flushed:   After medicines have been given.   After blood has been drawn.   As part of routine maintenance.   A port is normally flushed with:   Normal saline.   Heparin.   Follow your caregiver's advice on how often, how much, and the type of flush to use on your port.  IMPORTANT PORT INFORMATION  Tell your caregiver if you are allergic to heparin.   After your port is placed, you will get a manufacturer's information card. The card has information about your port. Keep this card with you at all times.   There are many types of ports available. Know what kind of port you have.   In case of an emergency, it may be helpful to wear a medical alert bracelet. This can help alert health care workers that you have a port.   The port can stay in for as long as your caregiver believes it is necessary.   When it is time for the port to come out, surgery will be done to remove it. The surgery will be similar to how the port was put in.   If you are in the hospital or clinic:   Your port will be taken care of and flushed by a nurse.   If you are at home:   A home health care nurse may give medicines and take care of the port.   You or a family member can get special training and directions for giving medicine and taking care of the port at home.  SEEK IMMEDIATE MEDICAL CARE IF:   Your port does not flush or you are unable to get a blood return.   New drainage or pus is coming from the incision.   A bad smell is coming from the incision site.   You develop swelling or increased redness at the incision site.   You develop increased swelling or pain at the port site.   You develop swelling or pain in the surrounding skin near the port.   You have an oral temperature above 102 F (38.9 C), not controlled by medicine.  MAKE SURE YOU:   Understand these instructions.   Will watch your condition.   Will get help right away if you are not doing well or get  worse.  Document Released: 05/01/2005 Document Revised: 04/20/2011 Document Reviewed: 07/23/2008 [facility] Patient Information 2012 [place], [place].

## 2012-01-05 ENCOUNTER — Telehealth: Payer: Self-pay | Admitting: *Deleted

## 2012-01-05 NOTE — Telephone Encounter (Signed)
Received call from pt sister Lawrence Villa requesting to speak with Dr. Truett Perna regarding her brother's condition. (sister is on release of information list)  Note sent to Dr. Truett Perna.

## 2012-01-09 ENCOUNTER — Encounter (HOSPITAL_COMMUNITY)
Admission: RE | Admit: 2012-01-09 | Discharge: 2012-01-09 | Disposition: A | Discharge status: Home / Self Care | Payer: Federal, State, Local not specified - PPO | Source: Ambulatory Visit | Attending: General Surgery | Admitting: General Surgery

## 2012-01-09 ENCOUNTER — Other Ambulatory Visit: Payer: Federal, State, Local not specified - PPO

## 2012-01-09 ENCOUNTER — Encounter: Payer: Self-pay | Admitting: *Deleted

## 2012-01-09 ENCOUNTER — Encounter (HOSPITAL_COMMUNITY): Payer: Self-pay

## 2012-01-09 DIAGNOSIS — C19 Malignant neoplasm of rectosigmoid junction: Secondary | ICD-10-CM

## 2012-01-09 LAB — COMPREHENSIVE METABOLIC PANEL
ALT: 19 U/L (ref 0–53)
AST: 40 U/L — ABNORMAL HIGH (ref 0–37)
Albumin: 3.2 g/dL — ABNORMAL LOW (ref 3.5–5.2)
Alkaline Phosphatase: 148 U/L — ABNORMAL HIGH (ref 39–117)
BUN: 16 mg/dL (ref 6–23)
CO2: 29 mEq/L (ref 19–32)
Calcium: 9.5 mg/dL (ref 8.4–10.5)
Chloride: 97 mEq/L (ref 96–112)
Creatinine, Ser: 1 mg/dL (ref 0.50–1.35)
GFR calc Af Amer: >90 mL/min (ref >90)
GFR calc non Af Amer: 87 mL/min — ABNORMAL LOW (ref >90)
Glucose, Bld: 119 mg/dL — ABNORMAL HIGH (ref 70–99)
Potassium: 4.6 mEq/L (ref 3.5–5.1)
Sodium: 135 mEq/L (ref 135–145)
Total Bilirubin: 0.7 mg/dL (ref 0.3–1.2)
Total Protein: 7.4 g/dL (ref 6.0–8.3)

## 2012-01-09 LAB — CBC
HCT: 32.7 % — ABNORMAL LOW (ref 39.0–52.0)
Hemoglobin: 10.6 g/dL — ABNORMAL LOW (ref 13.0–17.0)
MCH: 25.7 pg — ABNORMAL LOW (ref 26.0–34.0)
MCHC: 32.4 g/dL (ref 30.0–36.0)
MCV: 79.4 fL (ref 78.0–100.0)
Platelets: 443 10*3/uL — ABNORMAL HIGH (ref 150–400)
RBC: 4.12 MIL/uL — ABNORMAL LOW (ref 4.22–5.81)
RDW: 15.4 % (ref 11.5–15.5)
WBC: 11.6 10*3/uL — ABNORMAL HIGH (ref 4.0–10.5)

## 2012-01-09 MED ORDER — CEFAZOLIN SODIUM-DEXTROSE 2-3 GM-% IV SOLR
2.0000 g | INTRAVENOUS | Status: AC
  Administered 2012-01-10: 2 g via INTRAVENOUS
  Filled 2012-01-09: qty 50

## 2012-01-09 MED ORDER — CHLORHEXIDINE GLUCONATE 4 % EX LIQD: 1.0000 "application " | Freq: Once | CUTANEOUS | Status: DC

## 2012-01-09 NOTE — H&P (Signed)
  Lawrence Mention, MD   Patient ID: Lawrence Villa, male DOB: Feb 01, 1961, 51 y.o. 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 He underwent low anterior resection with stapled anastomosis and liver biopsy on 12/22/2011. 18 of 20 nodes were positive. Multiple liver metastasis. Pathologic stage T4, N2, M1. He has seen Dr. Truett Perna who plans chemotherapeutic protocol to start within 28 days of the surgery which I think is reasonable. He has met with Dr. Truett Perna twice now. Port-A-Cath insertion is scheduled for August 28. I discussed the indications and details and risks of Port-A-Cath insertion with him and his family today.  Clinically he is doing well. His appetite is picking up. He is having 2 formed bowel movements per day. He feels better than he did preop. He sees no blood. He is voiding uneventfully. But rare nausea, no vomiting.   Exam: Patient looks well. In no distress. Sister and mother with him. Weight 176 lbs.  Abdomen soft and nontender. No distention. Midline wound healing normally. Staples removed.   Assessment: Adenocarcinoma of the proximal rectum, stage T4, N2, M1. , Recovering uneventfully following low anterior resection with stapled anastomosis and liver biopsy.   Plan: Port-A-Cath insertion scheduled as outpatient August 28.  Followup with me in office for postop check in 6 weeks.  His eyes to adhere to a high fiber, low fat diet, encourage hydration, supplemental fiber,. Diet and activities discussed in detail.  I discussed the indications and details of and risks of Port-A-Cath insertion in detail. He understands these issues. His questions are answered. He agrees with this plan.     Angelia Mould. Derrell Lolling, M.D., [facility]  General and Minimally invasive Surgery  Breast and Colorectal Surgery  Office: [phone]  Pager: [phone]

## 2012-01-09 NOTE — Pre-Procedure Instructions (Signed)
20 TION TSE  01/09/2012   Your procedure is scheduled on:  Wednesday August 28  Report to [facility] at 9:15 AM.  Call this number if you have problems the morning of surgery: [phone]   Remember:   Do not eat or drink:After Midnight.    Take these medicines the morning of surgery with A SIP OF WATER: none  Stop taking aspirin, Coumadin, Plavix, Effient, and herbal medications today.     Do not wear jewelry, make-up or nail polish.  Do not wear lotions, powders, or perfumes. You may wear deodorant.  Do not shave 48 hours prior to surgery. Men may shave face and neck.  Do not bring valuables to the hospital.  Contacts, dentures or bridgework may not be worn into surgery.  Leave suitcase in the car. After surgery it may be brought to your room.  For patients admitted to the hospital, checkout time is 11:00 AM the day of discharge.   Patients discharged the day of surgery will not be allowed to drive home.  Name and phone number of your driver: family  Special Instructions: CHG Shower Use Special Wash: 1/2 bottle night before surgery and 1/2 bottle morning of surgery.   Please read over the following fact sheets that you were given: Pain Booklet, Coughing and Deep Breathing and Surgical Site Infection Prevention

## 2012-01-10 ENCOUNTER — Ambulatory Visit (HOSPITAL_COMMUNITY)
Admission: RE | Admit: 2012-01-10 | Discharge: 2012-01-10 | Disposition: A | Discharge status: Home / Self Care | Payer: Federal, State, Local not specified - PPO | Source: Ambulatory Visit | Attending: General Surgery | Admitting: General Surgery

## 2012-01-10 ENCOUNTER — Ambulatory Visit (HOSPITAL_COMMUNITY): Payer: Federal, State, Local not specified - PPO

## 2012-01-10 ENCOUNTER — Encounter (HOSPITAL_COMMUNITY): Payer: Self-pay | Admitting: Anesthesiology

## 2012-01-10 ENCOUNTER — Ambulatory Visit (HOSPITAL_COMMUNITY): Payer: Federal, State, Local not specified - PPO | Admitting: Anesthesiology

## 2012-01-10 ENCOUNTER — Encounter (HOSPITAL_COMMUNITY): Payer: Self-pay | Admitting: *Deleted

## 2012-01-10 ENCOUNTER — Encounter (HOSPITAL_COMMUNITY)
Admission: RE | Disposition: A | Discharge status: Home / Self Care | Payer: Self-pay | Source: Ambulatory Visit | Attending: General Surgery

## 2012-01-10 ENCOUNTER — Encounter: Payer: Federal, State, Local not specified - PPO | Admitting: Nutrition

## 2012-01-10 DIAGNOSIS — Z01812 Encounter for preprocedural laboratory examination: Secondary | ICD-10-CM | POA: Insufficient documentation

## 2012-01-10 DIAGNOSIS — C19 Malignant neoplasm of rectosigmoid junction: Secondary | ICD-10-CM

## 2012-01-10 DIAGNOSIS — C787 Secondary malignant neoplasm of liver and intrahepatic bile duct: Secondary | ICD-10-CM | POA: Insufficient documentation

## 2012-01-10 HISTORY — PX: PORTACATH PLACEMENT: SHX2246

## 2012-01-10 SURGERY — INSERTION, TUNNELED CENTRAL VENOUS DEVICE, WITH PORT
Anesthesia: General | Site: Chest | Wound class: Clean

## 2012-01-10 MED ORDER — ONDANSETRON HCL 4 MG/2ML IJ SOLN
INTRAMUSCULAR | Status: DC | PRN
  Administered 2012-01-10: 4 mg via INTRAVENOUS

## 2012-01-10 MED ORDER — PROPOFOL 10 MG/ML IV EMUL: INTRAVENOUS | Status: DC | PRN

## 2012-01-10 MED ORDER — LIDOCAINE HCL (CARDIAC) 20 MG/ML IV SOLN
INTRAVENOUS | Status: DC | PRN
  Administered 2012-01-10: 100 mg via INTRAVENOUS

## 2012-01-10 MED ORDER — LACTATED RINGERS IV SOLN
INTRAVENOUS | Status: DC | PRN
  Administered 2012-01-10 (×2): via INTRAVENOUS

## 2012-01-10 MED ORDER — HEPARIN SOD (PORK) LOCK FLUSH 100 UNIT/ML IV SOLN
INTRAVENOUS | Status: AC
  Filled 2012-01-10: qty 5

## 2012-01-10 MED ORDER — SODIUM CHLORIDE 0.9 % IR SOLN
Status: DC | PRN
  Administered 2012-01-10: 1

## 2012-01-10 MED ORDER — MORPHINE SULFATE 2 MG/ML IJ SOLN: 2.0000 mg | INTRAMUSCULAR | Status: DC | PRN

## 2012-01-10 MED ORDER — LIDOCAINE HCL (CARDIAC) 20 MG/ML IV SOLN: INTRAVENOUS | Status: DC | PRN

## 2012-01-10 MED ORDER — HEPARIN SOD (PORK) LOCK FLUSH 100 UNIT/ML IV SOLN
INTRAVENOUS | Status: DC | PRN
  Administered 2012-01-10: 500 [IU]

## 2012-01-10 MED ORDER — SODIUM CHLORIDE 0.9 % IR SOLN
Status: DC | PRN
  Administered 2012-01-10: 11:00:00

## 2012-01-10 MED ORDER — LIDOCAINE-EPINEPHRINE (PF) 1 %-1:200000 IJ SOLN
INTRAMUSCULAR | Status: DC | PRN
  Administered 2012-01-10: 17 mL

## 2012-01-10 MED ORDER — FENTANYL CITRATE 0.05 MG/ML IJ SOLN
INTRAMUSCULAR | Status: DC | PRN
  Administered 2012-01-10 (×4): 50 ug via INTRAVENOUS

## 2012-01-10 MED ORDER — PROPOFOL 10 MG/ML IV EMUL
INTRAVENOUS | Status: DC | PRN
  Administered 2012-01-10: 200 mg via INTRAVENOUS

## 2012-01-10 MED ORDER — LIDOCAINE-EPINEPHRINE (PF) 1 %-1:200000 IJ SOLN
INTRAMUSCULAR | Status: AC
  Filled 2012-01-10: qty 10

## 2012-01-10 MED ORDER — LACTATED RINGERS IV SOLN
INTRAVENOUS | Status: DC
  Administered 2012-01-10: 10:00:00 via INTRAVENOUS

## 2012-01-10 MED ORDER — OXYCODONE HCL 5 MG PO TABS: 5.0000 mg | ORAL_TABLET | ORAL | Status: DC | PRN

## 2012-01-10 MED ORDER — HYDROCODONE-ACETAMINOPHEN 5-325 MG PO TABS: 1.0000 | ORAL_TABLET | ORAL | Status: AC | PRN

## 2012-01-10 SURGICAL SUPPLY — 56 items
ADH SKN CLS APL DERMABOND .7 (GAUZE/BANDAGES/DRESSINGS) ×1
BAG DECANTER FOR FLEXI CONT (MISCELLANEOUS) ×2 IMPLANT
BLADE SURG 10 STRL SS (BLADE) ×2 IMPLANT
BLADE SURG 15 STRL LF DISP TIS (BLADE) ×1 IMPLANT
BLADE SURG 15 STRL SS (BLADE) ×2
BLADE SURG ROTATE 9660 (MISCELLANEOUS) ×1 IMPLANT
CANISTER SUCTION 2500CC (MISCELLANEOUS) IMPLANT
CHLORAPREP W/TINT 10.5 ML (MISCELLANEOUS) ×2 IMPLANT
CLOTH BEACON ORANGE TIMEOUT ST (SAFETY) ×2 IMPLANT
COVER PROBE W GEL 5X96 (DRAPES) IMPLANT
COVER SURGICAL LIGHT HANDLE (MISCELLANEOUS) ×2 IMPLANT
CRADLE DONUT ADULT HEAD (MISCELLANEOUS) ×2 IMPLANT
DECANTER SPIKE VIAL GLASS SM (MISCELLANEOUS) ×2 IMPLANT
DERMABOND ADVANCED (GAUZE/BANDAGES/DRESSINGS) ×1
DERMABOND ADVANCED .7 DNX12 (GAUZE/BANDAGES/DRESSINGS) ×1 IMPLANT
DRAPE C-ARM 42X72 X-RAY (DRAPES) ×2 IMPLANT
DRAPE LAPAROSCOPIC ABDOMINAL (DRAPES) ×2 IMPLANT
DRAPE UTILITY 15X26 W/TAPE STR (DRAPE) ×4 IMPLANT
ELECT CAUTERY BLADE 6.4 (BLADE) ×2 IMPLANT
ELECT REM PT RETURN 9FT ADLT (ELECTROSURGICAL) ×2
ELECTRODE REM PT RTRN 9FT ADLT (ELECTROSURGICAL) ×1 IMPLANT
GAUZE SPONGE 4X4 16PLY XRAY LF (GAUZE/BANDAGES/DRESSINGS) ×2 IMPLANT
GLOVE ECLIPSE 6.5 STRL STRAW (GLOVE) ×1 IMPLANT
GLOVE EUDERMIC 7 POWDERFREE (GLOVE) ×2 IMPLANT
GLOVE SURG SS PI 6.5 STRL IVOR (GLOVE) ×1 IMPLANT
GLOVE SURG SS PI 8.0 STRL IVOR (GLOVE) ×1 IMPLANT
GOWN PREVENTION PLUS XLARGE (GOWN DISPOSABLE) ×2 IMPLANT
GOWN STRL NON-REIN LRG LVL3 (GOWN DISPOSABLE) ×2 IMPLANT
INTRODUCER 13FR (MISCELLANEOUS) IMPLANT
INTRODUCER COOK 11FR (CATHETERS) IMPLANT
KIT BASIN OR (CUSTOM PROCEDURE TRAY) ×2 IMPLANT
KIT PORT POWER 9.6FR MRI PREA (Catheter) IMPLANT
KIT PORT POWER ISP 8FR (Catheter) ×1 IMPLANT
KIT POWER CATH 8FR (Catheter) IMPLANT
KIT ROOM TURNOVER OR (KITS) ×2 IMPLANT
NDL HYPO 25GX1X1/2 BEV (NEEDLE) ×1 IMPLANT
NEEDLE HYPO 25GX1X1/2 BEV (NEEDLE) ×4 IMPLANT
NS IRRIG 1000ML POUR BTL (IV SOLUTION) ×2 IMPLANT
PACK SURGICAL SETUP 50X90 (CUSTOM PROCEDURE TRAY) ×2 IMPLANT
PAD ARMBOARD 7.5X6 YLW CONV (MISCELLANEOUS) ×2 IMPLANT
PENCIL BUTTON HOLSTER BLD 10FT (ELECTRODE) ×2 IMPLANT
SET INTRODUCER 12FR PACEMAKER (SHEATH) IMPLANT
SET SHEATH INTRODUCER 10FR (MISCELLANEOUS) IMPLANT
SHEATH COOK PEEL AWAY SET 9F (SHEATH) IMPLANT
SURGILUBE 3G PEEL PACK STRL (MISCELLANEOUS) IMPLANT
SUT MNCRL AB 4-0 PS2 18 (SUTURE) ×2 IMPLANT
SUT PROLENE 2 0 CT2 30 (SUTURE) ×2 IMPLANT
SUT VIC AB 3-0 SH 18 (SUTURE) ×2 IMPLANT
SYR 20ML ECCENTRIC (SYRINGE) ×4 IMPLANT
SYR 5ML LUER SLIP (SYRINGE) ×2 IMPLANT
SYR BULB 3OZ (MISCELLANEOUS) ×2 IMPLANT
SYR CONTROL 10ML LL (SYRINGE) ×2 IMPLANT
TOWEL OR 17X24 6PK STRL BLUE (TOWEL DISPOSABLE) ×3 IMPLANT
TOWEL OR 17X26 10 PK STRL BLUE (TOWEL DISPOSABLE) ×2 IMPLANT
TUBE CONNECTING 12X1/4 (SUCTIONS) IMPLANT
YANKAUER SUCT BULB TIP NO VENT (SUCTIONS) IMPLANT

## 2012-01-10 NOTE — Transfer of Care (Signed)
Immediate Anesthesia Transfer of Care Note  Patient: Lawrence Villa  Procedure(s) Performed: Procedure(s) (LRB): INSERTION PORT-A-CATH (N/A)  Patient Location: PACU  Anesthesia Type: General  Level of Consciousness: sedated  Airway & Oxygen Therapy: Patient Spontanous Breathing and Patient connected to nasal cannula oxygen  Post-op Assessment: Report given to PACU RN and Post -op Vital signs reviewed and stable  Post vital signs: Reviewed  Complications: No apparent anesthesia complications

## 2012-01-10 NOTE — Op Note (Signed)
Patient Name:           Lawrence Villa   Date of Surgery:        01/10/2012  Pre op Diagnosis:      Stage IV cancer rectosigmoid colon  Post op Diagnosis:    same  Procedure:                 Insertion of 8 French power port venous vascular access device, utilizing fluoroscopy for guidance and positioning.  Surgeon:                     Angelia Mould. 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 He underwent anterior resection with stapled anastomosis and liver biopsy on 12/22/2011. 18 of 20 nodes were positive. Multiple liver metastasis. Pathologic stage T4, N2, M1. He has seen Dr. Truett Perna who plans chemotherapeutic protocol to start within 28 days of the surgery which I think is reasonable. He has met with Dr. Truett Perna twice now. Dr. Truett Perna has requested insertion of Port-A-Cath. I discussed the indications, details, techniques, and risks of this procedure with the patient and his mother. He agrees with this plan and is brought to the operating room electively.    Operative Findings:       The Port-A-Cath was inserted through the left subclavian vein. The tip of the Port-A-Cath appears to be in the superior vena cava at the right atrial junction.  Procedure in Detail:          Following the induction of general LMA anesthesia the patient was positioned with a small roll behind his shoulders, arms at his sides. The neck and chest were prepped and draped in a sterile fashion. Intravenous antibiotics were given. Surgical time out was performed. 0.5% Marcaine with epinephrine was used as a local infiltration anesthetic. A left subclavian venipuncture was performed with a single pass and good blood return. The guidewire was inserted into the superior vena cava vena cava under fluoroscopic guidance. Using fluoroscopy I marked a template of the chest wall to guide the placement of the catheter so the tip was  in the superior vena cava just above the right atrium. A small incision was made at the wire insertion site. A transverse incision was made in the left infaclavicular area about 2.5 cm below the wire insertion site. Dissection was carried down to the pectoralis fascia. A deep subcutaneous pocket was created. A tunneling device was used to drawl the catheter between the wire insertion site and the port pocket site. Using a template from the chest wall I measured  the catheter and  cut it 25 cm. The catheter was then secured to the port with a locking device and the entire assembly was flushed with heparinized saline. The port was sutured to the pectoralis fascia with 3 interrupted sutures of 2-0 Prolene. The dilator and peel-away sheath assembly was inserted over the guidewire into the central venous circulation. The wire and dilator were removed and the catheter threaded through the peel-away sheath the peel-away sheath removed. The catheter flushed easily and had excellent blood return. The catheter was flushed with concentrated heparin. Fluoroscopy was then used again and the catheter appeared to be in good position. There was no deformity of the catheter anywhere along its course and  the tip appeared to be at the cavo-atrial junction. Subcutaneous tissue was closed with 3-0 Vicryl sutures and the skin incisions closed with subcuticular sutures of 4-0 Monocryl and Dermabond. The patient tolerated the procedure well. He was taken to recovery in stable condition. EBL 10 cc. Counts correct.     Angelia Mould. Derrell Lolling, M.D., FACS General and Minimally Invasive Surgery Breast and Colorectal Surgery  01/10/2012 12:40 PM

## 2012-01-10 NOTE — Anesthesia Postprocedure Evaluation (Signed)
  Anesthesia Post-op Note  Patient: Lawrence Villa  Procedure(s) Performed: Procedure(s) (LRB): INSERTION PORT-A-CATH (N/A)  Patient Location: PACU  Anesthesia Type: General  Level of Consciousness: awake, alert , oriented and patient cooperative  Airway and Oxygen Therapy: Patient Spontanous Breathing and Patient connected to nasal cannula oxygen  Post-op Pain: none  Post-op Assessment: Post-op Vital signs reviewed, Patient's Cardiovascular Status Stable, Respiratory Function Stable, Patent Airway, No signs of Nausea or vomiting and Pain level controlled  Post-op Vital Signs: stable  Complications: No apparent anesthesia complications

## 2012-01-10 NOTE — Preoperative (Signed)
Beta Blockers   Reason not to administer Beta Blockers:Not Applicable 

## 2012-01-10 NOTE — Interval H&P Note (Signed)
History and Physical Interval Note:  01/10/2012 11:04 AM  Lawrence Villa  has presented today for surgery, with the diagnosis of colon cancer  The goals of treatment and the various methods of treatment have been discussed with the patient and family. After consideration of risks, benefits and other options for treatment, the patient has consented to  Procedure(s) (LRB): INSERTION PORT-A-CATH (N/A) as a surgical intervention .  The patient's history has been reviewed, patient examined, no change in status, stable for surgery.  I have reviewed the patient's chart and labs.  Questions were answered to the patient's satisfaction.     [name]

## 2012-01-10 NOTE — Anesthesia Preprocedure Evaluation (Signed)
Anesthesia Evaluation  Patient identified by MRN, date of birth, ID band Patient awake    Reviewed: Allergy & Precautions, H&P , NPO status , Patient's Chart, lab work & pertinent test results  Airway Mallampati: I TM Distance: >3 FB Neck ROM: full    Dental   Pulmonary          Cardiovascular Rhythm:regular Rate:Normal     Neuro/Psych    GI/Hepatic   Endo/Other    Renal/GU      Musculoskeletal   Abdominal   Peds  Hematology   Anesthesia Other Findings   Reproductive/Obstetrics                           Anesthesia Physical Anesthesia Plan  ASA: II  Anesthesia Plan: General   Post-op Pain Management:    Induction: Intravenous  Airway Management Planned: LMA and Oral ETT  Additional Equipment:   Intra-op Plan:   Post-operative Plan: Extubation in OR  Informed Consent: I have reviewed the patients History and Physical, chart, labs and discussed the procedure including the risks, benefits and alternatives for the proposed anesthesia with the patient or authorized representative who has indicated his/her understanding and acceptance.     Plan Discussed with: CRNA, Anesthesiologist and Surgeon  Anesthesia Plan Comments:         Anesthesia Quick Evaluation

## 2012-01-11 ENCOUNTER — Telehealth: Payer: Self-pay | Admitting: Oncology

## 2012-01-11 ENCOUNTER — Other Ambulatory Visit: Payer: Federal, State, Local not specified - PPO

## 2012-01-11 ENCOUNTER — Ambulatory Visit (HOSPITAL_BASED_OUTPATIENT_CLINIC_OR_DEPARTMENT_OTHER): Payer: Federal, State, Local not specified - PPO | Admitting: Nurse Practitioner

## 2012-01-11 ENCOUNTER — Ambulatory Visit: Payer: Federal, State, Local not specified - PPO | Admitting: Nurse Practitioner

## 2012-01-11 ENCOUNTER — Encounter (HOSPITAL_COMMUNITY): Payer: Self-pay | Admitting: General Surgery

## 2012-01-11 ENCOUNTER — Other Ambulatory Visit (HOSPITAL_BASED_OUTPATIENT_CLINIC_OR_DEPARTMENT_OTHER): Payer: Federal, State, Local not specified - PPO | Admitting: Lab

## 2012-01-11 ENCOUNTER — Other Ambulatory Visit: Payer: Self-pay

## 2012-01-11 VITALS — BP 104/70 | HR 67 | Temp 97.1°F | Resp 20 | Ht 72.0 in | Wt 177.7 lb

## 2012-01-11 DIAGNOSIS — C187 Malignant neoplasm of sigmoid colon: Secondary | ICD-10-CM

## 2012-01-11 DIAGNOSIS — C7800 Secondary malignant neoplasm of unspecified lung: Secondary | ICD-10-CM

## 2012-01-11 DIAGNOSIS — R634 Abnormal weight loss: Secondary | ICD-10-CM

## 2012-01-11 DIAGNOSIS — C19 Malignant neoplasm of rectosigmoid junction: Secondary | ICD-10-CM

## 2012-01-11 DIAGNOSIS — D509 Iron deficiency anemia, unspecified: Secondary | ICD-10-CM

## 2012-01-11 LAB — COMPREHENSIVE METABOLIC PANEL (CC13)
ALT: 19 U/L (ref 0–55)
Alkaline Phosphatase: 173 U/L — ABNORMAL HIGH (ref 40–150)
CO2: 27 mEq/L (ref 22–29)
Chloride: 102 mEq/L (ref 98–107)
Creatinine: 0.8 mg/dL (ref 0.7–1.3)
Glucose: 112 mg/dl — ABNORMAL HIGH (ref 70–99)
Sodium: 138 mEq/L (ref 136–145)
Total Protein: 5.9 g/dL — ABNORMAL LOW (ref 6.4–8.3)

## 2012-01-11 LAB — CBC WITH DIFFERENTIAL/PLATELET
BASO%: 0.4 % (ref 0.0–2.0)
Basophils Absolute: 0 10*3/uL (ref 0.0–0.1)
Eosinophils Absolute: 0.1 10*3/uL (ref 0.0–0.5)
HCT: 31.4 % — ABNORMAL LOW (ref 38.4–49.9)
HGB: 10.1 g/dL — ABNORMAL LOW (ref 13.0–17.1)
MCHC: 32.2 g/dL (ref 32.0–36.0)
MONO#: 0.8 10*3/uL (ref 0.1–0.9)
MONO%: 11.1 % (ref 0.0–14.0)
NEUT#: 5.2 10*3/uL (ref 1.5–6.5)
NEUT%: 71 % (ref 39.0–75.0)
WBC: 7.3 10*3/uL (ref 4.0–10.3)
lymph#: 1.2 10*3/uL (ref 0.9–3.3)

## 2012-01-11 LAB — URINALYSIS, MICROSCOPIC - CHCC
Bilirubin (Urine): NEGATIVE
Glucose: NEGATIVE g/dL
Leukocyte Esterase: NEGATIVE
RBC / HPF: NEGATIVE (ref 0–2)

## 2012-01-11 NOTE — Progress Notes (Signed)
OFFICE PROGRESS NOTE  Interval history:  Lawrence Villa returns as scheduled. Over the past weekend he had multiple episodes of nausea/vomiting in a 24-hour period. The nausea and vomiting have resolved. He is returning to a regular diet. Bowels moving normally. He denies numbness or tingling in his hands or feet.   Objective: Blood pressure 104/70, pulse 67, temperature 97.1 F (36.2 C), temperature source Oral, resp. rate 20, height 6' (1.829 m), weight 177 lb 11.2 oz (80.604 kg).  Oropharynx is without thrush or ulceration. Lungs are clear. Regular cardiac rhythm. Port-A-Cath site is without erythema. Abdomen soft and nontender. Midline incision has healed. Liver is palpable in the right upper abdomen. Extremities without edema.  Lab Results: Lab Results  Component Value Date   WBC 7.3 01/11/2012   HGB 10.1* 01/11/2012   HCT 31.4* 01/11/2012   MCV 78.3* 01/11/2012   PLT 416* 01/11/2012    Chemistry:    Chemistry      Component Value Date/Time   NA 138 01/11/2012 0958   NA 135 01/09/2012 1130   K 4.6 01/11/2012 0958   K 4.6 01/09/2012 1130   CL 102 01/11/2012 0958   CL 97 01/09/2012 1130   CO2 27 01/11/2012 0958   CO2 29 01/09/2012 1130   BUN 14.0 01/11/2012 0958   BUN 16 01/09/2012 1130   CREATININE 0.8 01/11/2012 0958   CREATININE 1.00 01/09/2012 1130      Component Value Date/Time   CALCIUM 9.5 01/09/2012 1130   ALKPHOS 173* 01/11/2012 0958   ALKPHOS 148* 01/09/2012 1130   AST 34 01/11/2012 0958   AST 40* 01/09/2012 1130   ALT 19 01/11/2012 0958   ALT 19 01/09/2012 1130   BILITOT 0.70 01/11/2012 0958   BILITOT 0.7 01/09/2012 1130       Studies/Results: Ct Chest W Contrast  12/18/2011  *RADIOLOGY REPORT*  Clinical Data:  Weight loss, anemia, suspected sigmoid colon cancer  CT CHEST, ABDOMEN AND PELVIS WITH CONTRAST  Technique:  Multidetector CT imaging of the chest, abdomen and pelvis was performed following the standard protocol during bolus administration of intravenous contrast.   Contrast: OMNIPAQUE IOHEXOL 300 MG/ML  SOLN  Comparison:  None.  CT CHEST  Findings:  The lungs are clear.  No suspicious pulmonary nodules. No pleural effusion or pneumothorax.  Visualized thyroid is unremarkable.  The heart is normal in size.  No pericardial effusion.  No suspicious mediastinal, hilar, or axillary lymphadenopathy.  Mild degenerative changes of the thoracic spine.  IMPRESSION: No evidence of metastatic disease in the chest.  CT ABDOMEN AND PELVIS  Findings:  Numerous hepatic metastases, approximately 2025 in number.  Representative lesions include: --5.4 x 5.4 cm lesion in the right hepatic dome (series 2/image 50) --5.2 x 5.2 cm lesion in the medial segment left hepatic lobe (series 2/image 56) --1.5 x 2.0 cm lesion in the lateral segment left hepatic lobe (series 2/57) --4.3 x 3.9 cm lesion in the anterior segment right hepatic lobe (series 2/image 61) --8.5 x 9.6 cm lesion in the posterior segment right hepatic lobe (series 2/image 71)  Spleen, pancreas, and adrenal glands are within normal limits.  Gallbladder is underdistended.  No intrahepatic or extrahepatic ductal dilatation.  Kidneys are within normal limits.  No hydronephrosis.  No evidence of bowel obstruction.  Normal appendix.  Colonic mass/apple-core lesion in the sigmoid colon, measuring approximately 4 cm in length (series 2/image 110), corresponding to suspected colonic adenocarcinoma.  Associated 3.9 x 4.3 cm soft tissue lesion in  the sigmoid mesocolon (series 2/image 106), compatible with extracolonic spread. Additional mesenteric nodes in the left abdomen measuring up to 10 mm short axis (series 2/image 98 and 100), reflecting nodal metastases.  No abdominopelvic ascites.  Prostate is unremarkable.  Bladder is within normal limits.  Very mild degenerative changes of the lumbar spine.  IMPRESSION: Colonic mass/apple-core lesion in the sigmoid colon, corresponding to suspected colonic adenocarcinoma.  4.3 cm soft tissue  lesion in the sigmoid mesial colon, compatible with extra colonic spread.  Associated nodal metastases in the left abdominal mesentery.  Numerous hepatic metastases, as described above, measuring up to 9.6 cm.  Original Report Authenticated By: Charline Bills, M.D.   Ct Abdomen Pelvis W Contrast  12/18/2011  *RADIOLOGY REPORT*  Clinical Data:  Weight loss, anemia, suspected sigmoid colon cancer  CT CHEST, ABDOMEN AND PELVIS WITH CONTRAST  Technique:  Multidetector CT imaging of the chest, abdomen and pelvis was performed following the standard protocol during bolus administration of intravenous contrast.  Contrast: OMNIPAQUE IOHEXOL 300 MG/ML  SOLN  Comparison:  None.  CT CHEST  Findings:  The lungs are clear.  No suspicious pulmonary nodules. No pleural effusion or pneumothorax.  Visualized thyroid is unremarkable.  The heart is normal in size.  No pericardial effusion.  No suspicious mediastinal, hilar, or axillary lymphadenopathy.  Mild degenerative changes of the thoracic spine.  IMPRESSION: No evidence of metastatic disease in the chest.  CT ABDOMEN AND PELVIS  Findings:  Numerous hepatic metastases, approximately 2025 in number.  Representative lesions include: --5.4 x 5.4 cm lesion in the right hepatic dome (series 2/image 50) --5.2 x 5.2 cm lesion in the medial segment left hepatic lobe (series 2/image 56) --1.5 x 2.0 cm lesion in the lateral segment left hepatic lobe (series 2/57) --4.3 x 3.9 cm lesion in the anterior segment right hepatic lobe (series 2/image 61) --8.5 x 9.6 cm lesion in the posterior segment right hepatic lobe (series 2/image 71)  Spleen, pancreas, and adrenal glands are within normal limits.  Gallbladder is underdistended.  No intrahepatic or extrahepatic ductal dilatation.  Kidneys are within normal limits.  No hydronephrosis.  No evidence of bowel obstruction.  Normal appendix.  Colonic mass/apple-core lesion in the sigmoid colon, measuring approximately 4 cm in length  (series 2/image 110), corresponding to suspected colonic adenocarcinoma.  Associated 3.9 x 4.3 cm soft tissue lesion in the sigmoid mesocolon (series 2/image 106), compatible with extracolonic spread. Additional mesenteric nodes in the left abdomen measuring up to 10 mm short axis (series 2/image 98 and 100), reflecting nodal metastases.  No abdominopelvic ascites.  Prostate is unremarkable.  Bladder is within normal limits.  Very mild degenerative changes of the lumbar spine.  IMPRESSION: Colonic mass/apple-core lesion in the sigmoid colon, corresponding to suspected colonic adenocarcinoma.  4.3 cm soft tissue lesion in the sigmoid mesial colon, compatible with extra colonic spread.  Associated nodal metastases in the left abdominal mesentery.  Numerous hepatic metastases, as described above, measuring up to 9.6 cm.  Original Report Authenticated By: Charline Bills, M.D.   Dg Chest Port 1 View  01/10/2012  *RADIOLOGY REPORT*  Clinical Data: Pneumothorax following port placement  PORTABLE CHEST - 1 VIEW  Comparison: CT thorax 12/18/2011  Findings: Interval placement of a left-sided port with tip in the distal SVC.  No pneumothorax.  Normal cardiac silhouette.  Lungs are clear.  IMPRESSION: Interval placement left port without pneumothorax.   Original Report Authenticated By: Genevive Bi, M.D.    Dg Fluoro Guide  Cv Line-no Report  01/10/2012  CLINICAL DATA: port-a-catheter insertion   FLOURO GUIDE CV LINE  Fluoroscopy was utilized by the requesting physician.  No radiographic  interpretation.      Medications: I have reviewed the patient's current medications.  Assessment/Plan:  1. Metastatic colon cancer status post low anterior resection on 12/22/2011 (T4, N2, M1). 2. Microcytic anemia. He continues oral iron. 3. Weight loss. Weight is stable. 4. Status post Port-A-Cath placement 01/10/2012.  Disposition-Lawrence Villa appears stable. He was initially considering enrollment on the MAVERICC study  with randomization to FOLFOX/Avastin or FOLFIRI/Avastin. However, due to his work schedule/financial concerns he does not feel that he can commit to the time requirements for either of these regimens. Dr. Truett Perna recommended CAPOX plus Avastin as an alternative. We again reviewed potential toxicities associated with oxaliplatin. We discussed potential toxicities associated with Xeloda including mouth sores, nausea, diarrhea, skin toxicity, hand-foot syndrome, conjunctivitis. He is most comfortable proceeding with CAPOX/Avastin initially. He will return for a followup visit and cycle 1 01/23/2012. He will contact the office in the interim with any problems.  Patient seen with Dr. Truett Perna.    Lonna Cobb ANP/GNP-BC

## 2012-01-11 NOTE — Telephone Encounter (Signed)
Gave pt appt calendar for September and October 2013 lab, chemo and MD

## 2012-01-19 ENCOUNTER — Encounter: Payer: Self-pay | Admitting: Oncology

## 2012-01-19 ENCOUNTER — Telehealth: Payer: Self-pay | Admitting: Nutrition

## 2012-01-19 NOTE — Telephone Encounter (Signed)
Patient's sister called to inform me patient had several episodes of Nausea and Vomiting.  She asked if I would contact him.  I called patient  to remind him that I would follow up with him on Tuesday when he was here for chemotherapy and patient reported 2 episodes of n/v.  He thinks he ate too much too fast.  Advised patient to consume small amounts of food lower in fat more often.  Patient appreciative of nutrition suggestions.  Will follow up on Tuesday, 01-23-12.

## 2012-01-19 NOTE — Progress Notes (Signed)
Faxed xeloda prescription to [facility]. °

## 2012-01-20 ENCOUNTER — Other Ambulatory Visit: Payer: Self-pay | Admitting: Oncology

## 2012-01-22 ENCOUNTER — Telehealth: Payer: Self-pay | Admitting: *Deleted

## 2012-01-22 NOTE — Telephone Encounter (Signed)
Received call from pt asking "can I eat/drink after midnight, or what"  Returned call and spoke with pt instructing him that he could eat/drink on his regular routine.  Explained that prior to chemo, he would be getting pre-meds to prevent n/v and meds to reduce any adverse reactions to chemo.  Pt verbalized understanding and voiced back date/time of appt.

## 2012-01-23 ENCOUNTER — Ambulatory Visit: Payer: Federal, State, Local not specified - PPO | Admitting: Nutrition

## 2012-01-23 ENCOUNTER — Other Ambulatory Visit (HOSPITAL_BASED_OUTPATIENT_CLINIC_OR_DEPARTMENT_OTHER): Payer: Federal, State, Local not specified - PPO

## 2012-01-23 ENCOUNTER — Telehealth: Payer: Self-pay | Admitting: Oncology

## 2012-01-23 ENCOUNTER — Other Ambulatory Visit: Payer: Federal, State, Local not specified - PPO | Admitting: Lab

## 2012-01-23 ENCOUNTER — Ambulatory Visit (HOSPITAL_BASED_OUTPATIENT_CLINIC_OR_DEPARTMENT_OTHER): Payer: Federal, State, Local not specified - PPO | Admitting: Nurse Practitioner

## 2012-01-23 ENCOUNTER — Ambulatory Visit (HOSPITAL_BASED_OUTPATIENT_CLINIC_OR_DEPARTMENT_OTHER): Payer: Federal, State, Local not specified - PPO

## 2012-01-23 ENCOUNTER — Ambulatory Visit: Payer: Federal, State, Local not specified - PPO | Admitting: Nurse Practitioner

## 2012-01-23 VITALS — BP 109/75 | HR 85 | Temp 97.0°F | Resp 18 | Ht 72.0 in | Wt 171.9 lb

## 2012-01-23 DIAGNOSIS — R634 Abnormal weight loss: Secondary | ICD-10-CM

## 2012-01-23 DIAGNOSIS — C787 Secondary malignant neoplasm of liver and intrahepatic bile duct: Secondary | ICD-10-CM

## 2012-01-23 DIAGNOSIS — C187 Malignant neoplasm of sigmoid colon: Secondary | ICD-10-CM

## 2012-01-23 DIAGNOSIS — C19 Malignant neoplasm of rectosigmoid junction: Secondary | ICD-10-CM

## 2012-01-23 DIAGNOSIS — D509 Iron deficiency anemia, unspecified: Secondary | ICD-10-CM

## 2012-01-23 DIAGNOSIS — Z5111 Encounter for antineoplastic chemotherapy: Secondary | ICD-10-CM

## 2012-01-23 LAB — CBC WITH DIFFERENTIAL/PLATELET
BASO%: 0.7 % (ref 0.0–2.0)
Eosinophils Absolute: 0.2 10*3/uL (ref 0.0–0.5)
MCHC: 32.1 g/dL (ref 32.0–36.0)
MONO#: 0.6 10*3/uL (ref 0.1–0.9)
NEUT#: 4.6 10*3/uL (ref 1.5–6.5)
RBC: 4.31 10*6/uL (ref 4.20–5.82)
RDW: 15.9 % — ABNORMAL HIGH (ref 11.0–14.6)
WBC: 7.2 10*3/uL (ref 4.0–10.3)
lymph#: 1.7 10*3/uL (ref 0.9–3.3)

## 2012-01-23 MED ORDER — ONDANSETRON 8 MG/50ML IVPB (CHCC)
8.0000 mg | Freq: Once | INTRAVENOUS | Status: AC
  Administered 2012-01-23: 8 mg via INTRAVENOUS

## 2012-01-23 MED ORDER — SODIUM CHLORIDE 0.9 % IV SOLN
Freq: Once | INTRAVENOUS | Status: AC
  Administered 2012-01-23: 11:00:00 via INTRAVENOUS

## 2012-01-23 MED ORDER — DEXAMETHASONE SODIUM PHOSPHATE 10 MG/ML IJ SOLN
10.0000 mg | Freq: Once | INTRAMUSCULAR | Status: AC
  Administered 2012-01-23: 10 mg via INTRAVENOUS

## 2012-01-23 MED ORDER — DEXTROSE 5 % IV SOLN
Freq: Once | INTRAVENOUS | Status: AC
  Administered 2012-01-23: 12:00:00 via INTRAVENOUS

## 2012-01-23 MED ORDER — PROCHLORPERAZINE MALEATE 10 MG PO TABS: 10.0000 mg | ORAL_TABLET | Freq: Four times a day (QID) | ORAL | Status: DC | PRN

## 2012-01-23 MED ORDER — HEPARIN SOD (PORK) LOCK FLUSH 100 UNIT/ML IV SOLN
500.0000 [IU] | Freq: Once | INTRAVENOUS | Status: AC | PRN
  Administered 2012-01-23: 500 [IU]
  Filled 2012-01-23: qty 5

## 2012-01-23 MED ORDER — SODIUM CHLORIDE 0.9 % IJ SOLN
10.0000 mL | INTRAMUSCULAR | Status: DC | PRN
  Administered 2012-01-23: 10 mL
  Filled 2012-01-23: qty 10

## 2012-01-23 MED ORDER — SODIUM CHLORIDE 0.9 % IV SOLN
7.5000 mg/kg | Freq: Once | INTRAVENOUS | Status: AC
  Administered 2012-01-23: 600 mg via INTRAVENOUS
  Filled 2012-01-23: qty 24

## 2012-01-23 MED ORDER — OXALIPLATIN CHEMO INJECTION 100 MG/20ML
130.0000 mg/m2 | Freq: Once | INTRAVENOUS | Status: AC
  Administered 2012-01-23: 265 mg via INTRAVENOUS
  Filled 2012-01-23: qty 53

## 2012-01-23 NOTE — Telephone Encounter (Signed)
Pt on sch already for 10/1,no pof was entered,appt printed for pt       aom

## 2012-01-23 NOTE — Progress Notes (Signed)
OFFICE PROGRESS NOTE  Interval history:  Lawrence Villa returns as scheduled. He has had 2 episodes of nausea with vomiting since his last visit. Bowels moving regularly. He denies abdominal pain. He reports losing 3 pounds over the past few weeks.   Objective: Blood pressure 109/75, pulse 85, temperature 97 F (36.1 C), temperature source Oral, resp. rate 18, height 6' (1.829 m), weight 171 lb 14.4 oz (77.973 kg).  Oropharynx is without thrush or ulceration. Lungs are clear. Regular cardiac rhythm. Port-A-Cath site is without erythema. Abdomen is soft and nontender. Healed midline incision. Liver is palpable in the right abdomen approximately 5 fingerbreadths below the right costal margin. Extremities are without edema.  Lab Results: Lab Results  Component Value Date   WBC 7.2 01/23/2012   HGB 10.7* 01/23/2012   HCT 33.3* 01/23/2012   MCV 77.3* 01/23/2012   PLT 600* 01/23/2012    Chemistry:    Chemistry      Component Value Date/Time   NA 138 01/11/2012 0958   NA 135 01/09/2012 1130   K 4.6 01/11/2012 0958   K 4.6 01/09/2012 1130   CL 102 01/11/2012 0958   CL 97 01/09/2012 1130   CO2 27 01/11/2012 0958   CO2 29 01/09/2012 1130   BUN 14.0 01/11/2012 0958   BUN 16 01/09/2012 1130   CREATININE 0.8 01/11/2012 0958   CREATININE 1.00 01/09/2012 1130      Component Value Date/Time   CALCIUM 9.5 01/09/2012 1130   ALKPHOS 173* 01/11/2012 0958   ALKPHOS 148* 01/09/2012 1130   AST 34 01/11/2012 0958   AST 40* 01/09/2012 1130   ALT 19 01/11/2012 0958   ALT 19 01/09/2012 1130   BILITOT 0.70 01/11/2012 0958   BILITOT 0.7 01/09/2012 1130       Studies/Results: Dg Chest Port 1 View  01/10/2012  *RADIOLOGY REPORT*  Clinical Data: Pneumothorax following port placement  PORTABLE CHEST - 1 VIEW  Comparison: CT thorax 12/18/2011  Findings: Interval placement of a left-sided port with tip in the distal SVC.  No pneumothorax.  Normal cardiac silhouette.  Lungs are clear.  IMPRESSION: Interval placement left port  without pneumothorax.   Original Report Authenticated By: Genevive Bi, M.D.    Dg Fluoro Guide Cv Line-no Report  01/10/2012  CLINICAL DATA: port-a-catheter insertion   FLOURO GUIDE CV LINE  Fluoroscopy was utilized by the requesting physician.  No radiographic  interpretation.      Medications: I have reviewed the patient's current medications.  Assessment/Plan:  1. Metastatic colon cancer status post low anterior resection on 12/22/2011 (T4, N2, M1). 2. Microcytic anemia. He continues oral iron. 3. Weight loss. He has met with the cancer Center dietitian. 4. Status post Port-A-Cath placement 01/10/2012.  Disposition-Lawrence Villa appears stable. Plan to proceed with cycle 1 CAPOX and Avastin today as scheduled. He will return for a followup visit and cycle 2 in 3 weeks. He will contact the office in the interim with any problems.  Plan reviewed with Dr. Truett Perna.  Lonna Cobb ANP/GNP-BC

## 2012-01-23 NOTE — Progress Notes (Signed)
Mr. Maines has had some difficulty with nausea and vomiting since I met with him originally the end of August.  He reports that he feels like he overdid it and tried to eat or drink too much at 1 time and that contributed to this nausea.  His weight has decreased to 171.9 pounds September 10th from 177.3 pounds on August 20th.  He denies other nutrition side effects at this time.  NUTRITION DIAGNOSIS:  Unintended weight loss continues.  INTERVENTION:  I educated the patient on the importance of smaller amounts of food more often, including higher-calorie, higher-protein foods.  I have discouraged continued weight loss during treatment. Patient able to verbalize strategies for increasing oral intake.  The patient is appreciative of nutrition interventions.  MONITORING/EVALUATION/GOALS:  The patient has been unable to increase his oral intake and has had continued weight loss.  NEXT VISIT:  Tuesday, October 1st, during chemotherapy.   ______________________________ Zenovia Jarred, RD, CSO, LDN Clinical Nutrition Specialist BN/MEDQ  D:  01/23/2012  T:  01/23/2012  Job:  [id number]

## 2012-01-23 NOTE — Patient Instructions (Addendum)
Place patient instructions, either created by your organOxaliplatin Injection What is this medicine? OXALIPLATIN (ox AL i PLA tin) is a chemotherapy drug. It targets fast dividing cells, like cancer cells, and causes these cells to die. This medicine is used to treat cancers of the colon and rectum, and many other cancers. This medicine may be used for other purposes; ask your health care provider or pharmacist if you have questions. What should I tell my health care provider before I take this medicine? They need to know if you have any of these conditions: -kidney disease -an unusual or allergic reaction to oxaliplatin, other chemotherapy, other medicines, foods, dyes, or preservatives -pregnant or trying to get pregnant -breast-feeding How should I use this medicine? This drug is given as an infusion into a vein. It is administered in a hospital or clinic by a specially trained health care professional. Talk to your pediatrician regarding the use of this medicine in children. Special care may be needed. Overdosage: If you think you have taken too much of this medicine contact a poison control center or emergency room at once. NOTE: This medicine is only for you. Do not share this medicine with others. What if I miss a dose? It is important not to miss a dose. Call your doctor or health care professional if you are unable to keep an appointment. What may interact with this medicine? -medicines to increase blood counts like filgrastim, pegfilgrastim, sargramostim -probenecid -some antibiotics like amikacin, gentamicin, neomycin, polymyxin B, streptomycin, tobramycin -zalcitabine Talk to your doctor or health care professional before taking any of these medicines: -acetaminophen -aspirin -ibuprofen -ketoprofen -naproxen This list may not describe all possible interactions. Give your health care provider a list of all the medicines, herbs, non-prescription drugs, or dietary supplements you  use. Also tell them if you smoke, drink alcohol, or use illegal drugs. Some items may interact with your medicine. What should I watch for while using this medicine? Your condition will be monitored carefully while you are receiving this medicine. You will need important blood work done while you are taking this medicine. This medicine can make you more sensitive to cold. Do not drink cold drinks or use ice. Cover exposed skin before coming in contact with cold temperatures or cold objects. When out in cold weather wear warm clothing and cover your mouth and nose to warm the air that goes into your lungs. Tell your doctor if you get sensitive to the cold. This drug may make you feel generally unwell. This is not uncommon, as chemotherapy can affect healthy cells as well as cancer cells. Report any side effects. Continue your course of treatment even though you feel ill unless your doctor tells you to stop. In some cases, you may be given additional medicines to help with side effects. Follow all directions for their use. Call your doctor or health care professional for advice if you get a fever, chills or sore throat, or other symptoms of a cold or flu. Do not treat yourself. This drug decreases your body's ability to fight infections. Try to avoid being around people who are sick. This medicine may increase your risk to bruise or bleed. Call your doctor or health care professional if you notice any unusual bleeding. Be careful brushing and flossing your teeth or using a toothpick because you may get an infection or bleed more easily. If you have any dental work done, tell your dentist you are receiving this medicine. Avoid taking products that contain aspirin,  acetaminophen, ibuprofen, naproxen, or ketoprofen unless instructed by your doctor. These medicines may hide a fever. Do not become pregnant while taking this medicine. Women should inform their doctor if they wish to become pregnant or think they  might be pregnant. There is a potential for serious side effects to an unborn child. Talk to your health care professional or pharmacist for more information. Do not breast-feed an infant while taking this medicine. Call your doctor or health care professional if you get diarrhea. Do not treat yourself. What side effects may I notice from receiving this medicine? Side effects that you should report to your doctor or health care professional as soon as possible: -allergic reactions like skin rash, itching or hives, swelling of the face, lips, or tongue -low blood counts - This drug may decrease the number of white blood cells, red blood cells and platelets. You may be at increased risk for infections and bleeding. -signs of infection - fever or chills, cough, sore throat, pain or difficulty passing urine -signs of decreased platelets or bleeding - bruising, pinpoint red spots on the skin, black, tarry stools, nosebleeds -signs of decreased red blood cells - unusually weak or tired, fainting spells, lightheadedness -breathing problems -chest pain, pressure -cough -diarrhea -jaw tightness -mouth sores -nausea and vomiting -pain, swelling, redness or irritation at the injection site -pain, tingling, numbness in the hands or feet -problems with balance, talking, walking -redness, blistering, peeling or loosening of the skin, including inside the mouth -trouble passing urine or change in the amount of urine Side effects that usually do not require medical attention (report to your doctor or health care professional if they continue or are bothersome): -changes in vision -constipation -hair loss -loss of appetite -metallic taste in the mouth or changes in taste -stomach pain This list may not describe all possible side effects. Call your doctor for medical advice about side effects. You may report side effects to FDA at 1-800-FDA-1088. Where should I keep my medicine? This drug is given in a  hospital or clinic and will not be stored at home. NOTE: This sheet is a summary. It may not cover all possible information. If you have questions about this medicine, talk to your doctor, pharmacist, or health care provider.  2012, Elsevier/Gold Standard. (11/26/2007 5:22:47 PM)Bevacizumab injection What is this medicine? BEVACIZUMAB (be va SIZ yoo mab) is a chemotherapy drug. It targets a protein found in many cancer cell types, and halts cancer growth. This drug treats many cancers including non-small cell lung cancer, and colon or rectal cancer. It is usually given with other chemotherapy drugs. This medicine may be used for other purposes; ask your health care provider or pharmacist if you have questions. What should I tell my health care provider before I take this medicine? They need to know if you have any of these conditions: -blood clots -heart disease, including heart failure, heart attack, or chest pain (angina) -high blood pressure -infection (especially a virus infection such as chickenpox, cold sores, or herpes) -kidney disease -lung disease -prior chemotherapy with doxorubicin, daunorubicin, epirubicin, or other anthracycline type chemotherapy agents -recent or ongoing radiation therapy -recent surgery -stroke -an unusual or allergic reaction to bevacizumab, hamster proteins, mouse proteins, other medicines, foods, dyes, or preservatives -pregnant or trying to get pregnant -breast-feeding How should I use this medicine? This medicine is for infusion into a vein. It is given by a health care professional in a hospital or clinic setting. Talk to your pediatrician regarding the use  of this medicine in children. Special care may be needed. Overdosage: If you think you have taken too much of this medicine contact a poison control center or emergency room at once. NOTE: This medicine is only for you. Do not share this medicine with others. What if I miss a dose? It is important  not to miss your dose. Call your doctor or health care professional if you are unable to keep an appointment. What may interact with this medicine? Interactions are not expected. This list may not describe all possible interactions. Give your health care provider a list of all the medicines, herbs, non-prescription drugs, or dietary supplements you use. Also tell them if you smoke, drink alcohol, or use illegal drugs. Some items may interact with your medicine. What should I watch for while using this medicine? Your condition will be monitored carefully while you are receiving this medicine. You will need important blood work and urine testing done while you are taking this medicine. During your treatment, let your health care professional know if you have any unusual symptoms, such as difficulty breathing. This medicine may rarely cause 'gastrointestinal perforation' (holes in the stomach, intestines or colon), a serious side effect requiring surgery to repair. This medicine should be started at least 28 days following major surgery and the site of the surgery should be totally healed. Check with your doctor before scheduling dental work or surgery while you are receiving this treatment. Talk to your doctor if you have recently had surgery or if you have a wound that has not healed. Do not become pregnant while taking this medicine. Women should inform their doctor if they wish to become pregnant or think they might be pregnant. There is a potential for serious side effects to an unborn child. Talk to your health care professional or pharmacist for more information. Do not breast-feed an infant while taking this medicine. This medicine has caused ovarian failure in some women. This medicine may interfere with the ability to have a child. You should talk to your doctor or health care professional if you are concerned about your fertility. What side effects may I notice from receiving this medicine? Side  effects that you should report to your doctor or health care professional as soon as possible: -allergic reactions like skin rash, itching or hives, swelling of the face, lips, or tongue -signs of infection - fever or chills, cough, sore throat, pain or trouble passing urine -signs of decreased platelets or bleeding - bruising, pinpoint red spots on the skin, black, tarry stools, nosebleeds, blood in the urine -breathing problems -changes in vision -chest pain -confusion -jaw pain, especially after dental work -mouth sores -seizures -severe abdominal pain -severe headache -sudden numbness or weakness of the face, arm or leg -swelling of legs or ankles -symptoms of a stroke: change in mental awareness, inability to talk or move one side of the body (especially in patients with lung cancer) -trouble passing urine or change in the amount of urine -trouble speaking or understanding -trouble walking, dizziness, loss of balance or coordination Side effects that usually do not require medical attention (report to your doctor or health care professional if they continue or are bothersome): -constipation -diarrhea -dry skin -headache -loss of appetite -nausea, vomiting This list may not describe all possible side effects. Call your doctor for medical advice about side effects. You may report side effects to FDA at 1-800-FDA-1088. Where should I keep my medicine? This drug is given in a hospital or clinic  and will not be stored at home. NOTE: This sheet is a summary. It may not cover all possible information. If you have questions about this medicine, talk to your doctor, pharmacist, or health care provider.  2012, Elsevier/Gold Standard. (04/01/2010 4:25:37 PM)ization or obtained from a 3rd party, here.

## 2012-01-24 ENCOUNTER — Telehealth: Payer: Self-pay | Admitting: *Deleted

## 2012-01-24 NOTE — Telephone Encounter (Signed)
Patient states he is feeling fine today, the only issue he noticed was last night, he woke up sweating, took off the covers, then got cold. No fevers noted. Informed patient if this continues, to call us back. No nausea/vomiting/diarrhea.

## 2012-01-29 ENCOUNTER — Encounter (INDEPENDENT_AMBULATORY_CARE_PROVIDER_SITE_OTHER): Payer: Self-pay

## 2012-01-29 ENCOUNTER — Telehealth: Payer: Self-pay | Admitting: Nutrition

## 2012-01-29 NOTE — Telephone Encounter (Signed)
Patient called with questions regarding foods he could add to his diet.  States his weight is about 170 pounds and he continues to try to eat often to increase overall consumption of calories and protein.  He is going to add Ensure up to BID between meals.  Educated patient on strategies for adding foods to meals.  Patient will continue to call with questions as needed.

## 2012-02-07 ENCOUNTER — Ambulatory Visit (INDEPENDENT_AMBULATORY_CARE_PROVIDER_SITE_OTHER): Payer: Federal, State, Local not specified - PPO | Admitting: General Surgery

## 2012-02-07 ENCOUNTER — Encounter (INDEPENDENT_AMBULATORY_CARE_PROVIDER_SITE_OTHER): Payer: Self-pay | Admitting: General Surgery

## 2012-02-07 VITALS — BP 128/86 | HR 74 | Temp 97.9°F | Resp 16 | Ht 72.0 in | Wt 174.5 lb

## 2012-02-07 DIAGNOSIS — C19 Malignant neoplasm of rectosigmoid junction: Secondary | ICD-10-CM

## 2012-02-07 NOTE — Progress Notes (Signed)
Patient ID: RUSLAN MCCABE, male   DOB: 1960/08/01, 51 y.o.   MRN: [id number] History:  This patient underwent low anterior resection and wedge excision of a liver metastasis on 12/22/2011. Pathologic stage of his tumor was T4a, N2b, M1. Port-A-Cath was inserted by me on 01/10/2011. He has begun chemotherapy 2 weeks ago. It has gone well. He is tolerating a diet. He is having 1 or 2 bowel movements per day. He feels pretty good. He has started to go back to work at one of his 2 jobs.  Exam: Patient looks well. In no distress. Weight 174.8 pounds. Abdomen soft, flat, nontender. Lower midline wound well healed no hernia. No inguinal adenopathy Port-A-Cath site, left infraclavicular area looks fine.  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 Colette Ribas Diet and activities discussed and Return to see me in 5 months.   Angelia Mould. Derrell Lolling, M.D., [facility] General and Minimally invasive Surgery Breast and Colorectal Surgery Office:   [phone] Pager:   [phone]

## 2012-02-07 NOTE — Patient Instructions (Signed)
All of your surgical wounds have healed well. There is no evidence of any surgical  complication.  You may resume normal physical activities without restriction.  You may return to work at your second job on October 19.  Return to see Dr. Derrell Lolling in 5 months.

## 2012-02-11 ENCOUNTER — Other Ambulatory Visit: Payer: Self-pay | Admitting: Oncology

## 2012-02-13 ENCOUNTER — Ambulatory Visit (HOSPITAL_BASED_OUTPATIENT_CLINIC_OR_DEPARTMENT_OTHER): Payer: Federal, State, Local not specified - PPO

## 2012-02-13 ENCOUNTER — Ambulatory Visit (HOSPITAL_BASED_OUTPATIENT_CLINIC_OR_DEPARTMENT_OTHER): Payer: Federal, State, Local not specified - PPO | Admitting: Oncology

## 2012-02-13 ENCOUNTER — Telehealth: Payer: Self-pay | Admitting: Oncology

## 2012-02-13 ENCOUNTER — Ambulatory Visit: Payer: Federal, State, Local not specified - PPO | Admitting: Nutrition

## 2012-02-13 ENCOUNTER — Other Ambulatory Visit: Payer: Self-pay | Admitting: Oncology

## 2012-02-13 ENCOUNTER — Other Ambulatory Visit (HOSPITAL_BASED_OUTPATIENT_CLINIC_OR_DEPARTMENT_OTHER): Payer: Federal, State, Local not specified - PPO | Admitting: Lab

## 2012-02-13 ENCOUNTER — Ambulatory Visit: Payer: Federal, State, Local not specified - PPO | Admitting: Oncology

## 2012-02-13 ENCOUNTER — Other Ambulatory Visit: Payer: Self-pay | Admitting: *Deleted

## 2012-02-13 VITALS — BP 129/87 | HR 70 | Temp 96.6°F | Resp 20 | Ht 72.0 in | Wt 178.3 lb

## 2012-02-13 DIAGNOSIS — C187 Malignant neoplasm of sigmoid colon: Secondary | ICD-10-CM

## 2012-02-13 DIAGNOSIS — D509 Iron deficiency anemia, unspecified: Secondary | ICD-10-CM

## 2012-02-13 DIAGNOSIS — C787 Secondary malignant neoplasm of liver and intrahepatic bile duct: Secondary | ICD-10-CM

## 2012-02-13 DIAGNOSIS — Z5111 Encounter for antineoplastic chemotherapy: Secondary | ICD-10-CM

## 2012-02-13 DIAGNOSIS — C19 Malignant neoplasm of rectosigmoid junction: Secondary | ICD-10-CM

## 2012-02-13 DIAGNOSIS — C7800 Secondary malignant neoplasm of unspecified lung: Secondary | ICD-10-CM

## 2012-02-13 DIAGNOSIS — C801 Malignant (primary) neoplasm, unspecified: Secondary | ICD-10-CM

## 2012-02-13 LAB — COMPREHENSIVE METABOLIC PANEL (CC13)
ALT: 27 U/L (ref 0–55)
Albumin: 3.6 g/dL (ref 3.5–5.0)
Alkaline Phosphatase: 84 U/L (ref 40–150)
CO2: 23 mEq/L (ref 22–29)
Glucose: 98 mg/dl (ref 70–99)
Potassium: 4.2 mEq/L (ref 3.5–5.1)
Sodium: 140 mEq/L (ref 136–145)
Total Bilirubin: 0.6 mg/dL (ref 0.20–1.20)
Total Protein: 6.9 g/dL (ref 6.4–8.3)

## 2012-02-13 LAB — CBC WITH DIFFERENTIAL/PLATELET
Basophils Absolute: 0 10*3/uL (ref 0.0–0.1)
Eosinophils Absolute: 0.2 10*3/uL (ref 0.0–0.5)
HGB: 12.5 g/dL — ABNORMAL LOW (ref 13.0–17.1)
MCV: 81.6 fL (ref 79.3–98.0)
MONO%: 14.5 % — ABNORMAL HIGH (ref 0.0–14.0)
NEUT#: 1.9 10*3/uL (ref 1.5–6.5)
RBC: 4.67 10*6/uL (ref 4.20–5.82)
RDW: 23.2 % — ABNORMAL HIGH (ref 11.0–14.6)
WBC: 4.8 10*3/uL (ref 4.0–10.3)
lymph#: 2 10*3/uL (ref 0.9–3.3)
nRBC: 0 % (ref 0–0)

## 2012-02-13 MED ORDER — CAPECITABINE 500 MG PO TABS: 1500.0000 mg | ORAL_TABLET | Freq: Two times a day (BID) | ORAL | Status: DC

## 2012-02-13 MED ORDER — DEXAMETHASONE SODIUM PHOSPHATE 10 MG/ML IJ SOLN
10.0000 mg | Freq: Once | INTRAMUSCULAR | Status: AC
  Administered 2012-02-13: 10 mg via INTRAVENOUS

## 2012-02-13 MED ORDER — OXALIPLATIN CHEMO INJECTION 100 MG/20ML
130.0000 mg/m2 | Freq: Once | INTRAVENOUS | Status: AC
  Administered 2012-02-13: 265 mg via INTRAVENOUS
  Filled 2012-02-13: qty 53

## 2012-02-13 MED ORDER — ONDANSETRON HCL 8 MG PO TABS: 8.0000 mg | ORAL_TABLET | Freq: Two times a day (BID) | ORAL | Status: DC | PRN

## 2012-02-13 MED ORDER — ONDANSETRON 8 MG/50ML IVPB (CHCC)
8.0000 mg | Freq: Once | INTRAVENOUS | Status: AC
  Administered 2012-02-13: 8 mg via INTRAVENOUS

## 2012-02-13 MED ORDER — SODIUM CHLORIDE 0.9 % IV SOLN
7.5000 mg/kg | Freq: Once | INTRAVENOUS | Status: AC
  Administered 2012-02-13: 600 mg via INTRAVENOUS
  Filled 2012-02-13: qty 24

## 2012-02-13 MED ORDER — DEXTROSE 5 % IV SOLN
Freq: Once | INTRAVENOUS | Status: AC
  Administered 2012-02-13: 11:00:00 via INTRAVENOUS

## 2012-02-13 MED ORDER — SODIUM CHLORIDE 0.9 % IJ SOLN
10.0000 mL | INTRAMUSCULAR | Status: DC | PRN
  Administered 2012-02-13: 10 mL
  Filled 2012-02-13: qty 10

## 2012-02-13 MED ORDER — HEPARIN SOD (PORK) LOCK FLUSH 100 UNIT/ML IV SOLN
500.0000 [IU] | Freq: Once | INTRAVENOUS | Status: AC | PRN
  Administered 2012-02-13: 500 [IU]
  Filled 2012-02-13: qty 5

## 2012-02-13 MED ORDER — SODIUM CHLORIDE 0.9 % IV SOLN
Freq: Once | INTRAVENOUS | Status: AC
  Administered 2012-02-13: 14:00:00 via INTRAVENOUS

## 2012-02-13 NOTE — Progress Notes (Signed)
Lawrence Villa reports he is feeling well.  He has a great appetite.  His weight has increased to 178.3 pounds on October 1st from 171.9 pounds September 10th.  The patient states he would like to begin exercising a bit now that he has healed.  He has no nutrition questions. He continues Ensure Plus b.i.d.  NUTRITION DIAGNOSIS:  Unintended weight loss improved.  INTERVENTION:  I educated the patient on the importance of continuing Ensure Plus b.i.d., along with small amounts of food throughout the day to promote weight maintenance.  I have provided additional coupons for the patient to purchase product as desired.  MONITORING/EVALUATION/GOALS:  The patient has been able to increase his oral intake for weight gain.  NEXT VISIT:  Tuesday, October 22nd during chemotherapy.    ______________________________ Zenovia Jarred, RD, CSO, LDN Clinical Nutrition Specialist BN/MEDQ  D:  02/13/2012  T:  02/13/2012  Job:  [phone]

## 2012-02-13 NOTE — Telephone Encounter (Signed)
gv pt appt schedule for October and November.  °

## 2012-02-13 NOTE — Patient Instructions (Addendum)
[facility] Discharge Instructions for Patients Receiving Chemotherapy  Today you received the following chemotherapy agents ; Oxaliplatin and Avastin.    AVOID COLD FOR THE NEXT 5 DAYS.   To help prevent nausea and vomiting after your treatment, we encourage you to take your nausea medication  ; Zofran (ondansetron) and Compazine (prochloraperzine) as directed.  If you develop nausea and vomiting that is not controlled by your nausea medication, call the clinic. If it is after clinic hours your family physician or the after hours number for the clinic or go to the Emergency Department.   BELOW ARE SYMPTOMS THAT SHOULD BE REPORTED IMMEDIATELY:  *FEVER GREATER THAN 100.5 F  *CHILLS WITH OR WITHOUT FEVER  NAUSEA AND VOMITING THAT IS NOT CONTROLLED WITH YOUR NAUSEA MEDICATION  *UNUSUAL SHORTNESS OF BREATH  *UNUSUAL BRUISING OR BLEEDING  TENDERNESS IN MOUTH AND THROAT WITH OR WITHOUT PRESENCE OF ULCERS  *URINARY PROBLEMS  *BOWEL PROBLEMS  UNUSUAL RASH Items with * indicate a potential emergency and should be followed up as soon as possible.   Feel free to call the clinic you have any questions or concerns. The clinic phone number is [phone].   I have been informed and understand all the instructions given to me. I know to contact the clinic, my physician, or go to the Emergency Department if any problems should occur. I do not have any questions at this time, but understand that I may call the clinic during office hours   should I have any questions or need assistance in obtaining follow up care.    __________________________________________  _____________  __________ Signature of Patient or Authorized Representative            Date                   Time    __________________________________________ Nurse's Signature

## 2012-02-13 NOTE — Progress Notes (Signed)
   [facility]    OFFICE PROGRESS NOTE   INTERVAL HISTORY:   She returns as scheduled. He completed a first cycle of CAPOX/Avastin beginning on 01/23/2012. He denies nausea/vomiting, mouth ulcers, diarrhea, and hand/foot pain. He has noted an improved appetite and energy level. There is cracking at the angle of the lips.  Objective:  Vital signs in last 24 hours:  Blood pressure 129/87, pulse 70, temperature 96.6 F (35.9 C), temperature source Oral, resp. rate 20, height 6' (1.829 m), weight 178 lb 4.8 oz (80.876 kg).    HEENT: No thrush or ulcers, mild angular chelitis Resp: Lungs clear bilaterally Cardio: Regular rate and rhythm GI: No hepatomegaly, healed midline incision Vascular: No leg edema    Portacath/PICC-without erythema  Lab Results:  Lab Results  Component Value Date   WBC 4.8 02/13/2012   HGB 12.5* 02/13/2012   HCT 38.1* 02/13/2012   MCV 81.6 02/13/2012   PLT 242 02/13/2012   ANC 1.9    Medications: I have reviewed the patient's current medications.  Assessment/Plan: 1. Metastatic colon cancer status post low anterior resection on 12/22/2011 (T4, N2, M1). Initiation of CAPOX/Avastin on 01/23/2012. 2. Microcytic anemia, improved. He continues oral iron. 3. Weight loss secondary to metastatic colon cancer-improved. 4. Status post Port-A-Cath placement 01/10/2012.  Disposition:  He tolerated the first cycle of systemic therapy with out significant acute toxicity. He plans to proceed with cycle 2 today. He will return for an office visit and cycle 3 of CAPOX/Avastin in 3 weeks.   Thornton Papas, MD  02/13/2012  1:43 PM

## 2012-02-29 ENCOUNTER — Encounter: Payer: Self-pay | Admitting: *Deleted

## 2012-03-03 ENCOUNTER — Other Ambulatory Visit: Payer: Self-pay | Admitting: Oncology

## 2012-03-05 ENCOUNTER — Telehealth: Payer: Self-pay | Admitting: *Deleted

## 2012-03-05 ENCOUNTER — Ambulatory Visit (HOSPITAL_BASED_OUTPATIENT_CLINIC_OR_DEPARTMENT_OTHER): Payer: Federal, State, Local not specified - PPO | Admitting: Nurse Practitioner

## 2012-03-05 ENCOUNTER — Ambulatory Visit (HOSPITAL_BASED_OUTPATIENT_CLINIC_OR_DEPARTMENT_OTHER): Payer: Federal, State, Local not specified - PPO

## 2012-03-05 ENCOUNTER — Other Ambulatory Visit (HOSPITAL_BASED_OUTPATIENT_CLINIC_OR_DEPARTMENT_OTHER): Payer: Federal, State, Local not specified - PPO | Admitting: Lab

## 2012-03-05 ENCOUNTER — Telehealth: Payer: Self-pay | Admitting: Oncology

## 2012-03-05 ENCOUNTER — Ambulatory Visit: Payer: Federal, State, Local not specified - PPO | Admitting: Nutrition

## 2012-03-05 VITALS — BP 130/78 | HR 56 | Resp 20

## 2012-03-05 VITALS — BP 113/78 | HR 76 | Temp 96.9°F | Resp 20 | Ht 72.0 in | Wt 174.8 lb

## 2012-03-05 DIAGNOSIS — Z5112 Encounter for antineoplastic immunotherapy: Secondary | ICD-10-CM

## 2012-03-05 DIAGNOSIS — C787 Secondary malignant neoplasm of liver and intrahepatic bile duct: Secondary | ICD-10-CM

## 2012-03-05 DIAGNOSIS — F411 Generalized anxiety disorder: Secondary | ICD-10-CM

## 2012-03-05 DIAGNOSIS — C19 Malignant neoplasm of rectosigmoid junction: Secondary | ICD-10-CM

## 2012-03-05 DIAGNOSIS — Z5111 Encounter for antineoplastic chemotherapy: Secondary | ICD-10-CM

## 2012-03-05 DIAGNOSIS — D509 Iron deficiency anemia, unspecified: Secondary | ICD-10-CM

## 2012-03-05 DIAGNOSIS — C187 Malignant neoplasm of sigmoid colon: Secondary | ICD-10-CM

## 2012-03-05 LAB — COMPREHENSIVE METABOLIC PANEL
ALT: 24 U/L (ref 0–53)
AST: 30 U/L (ref 0–37)
Calcium: 9.4 mg/dL (ref 8.4–10.5)
Creatinine, Ser: 0.81 mg/dL (ref 0.50–1.35)
Potassium: 4.2 mEq/L (ref 3.5–5.3)
Total Bilirubin: 1.2 mg/dL (ref 0.3–1.2)
Total Protein: 6.7 g/dL (ref 6.0–8.3)

## 2012-03-05 LAB — CBC WITH DIFFERENTIAL/PLATELET
BASO%: 1.2 % (ref 0.0–2.0)
EOS%: 3.1 % (ref 0.0–7.0)
MCH: 28.9 pg (ref 27.2–33.4)
MCHC: 33.9 g/dL (ref 32.0–36.0)
MONO#: 1 10*3/uL — ABNORMAL HIGH (ref 0.1–0.9)
RBC: 4.7 10*6/uL (ref 4.20–5.82)
RDW: 28.2 % — ABNORMAL HIGH (ref 11.0–14.6)
WBC: 5.2 10*3/uL (ref 4.0–10.3)
lymph#: 1.7 10*3/uL (ref 0.9–3.3)
nRBC: 0 % (ref 0–0)

## 2012-03-05 MED ORDER — ONDANSETRON 8 MG/50ML IVPB (CHCC)
8.0000 mg | Freq: Once | INTRAVENOUS | Status: AC
  Administered 2012-03-05: 8 mg via INTRAVENOUS

## 2012-03-05 MED ORDER — SODIUM CHLORIDE 0.9 % IV SOLN
Freq: Once | INTRAVENOUS | Status: AC
  Administered 2012-03-05: 11:00:00 via INTRAVENOUS

## 2012-03-05 MED ORDER — SODIUM CHLORIDE 0.9 % IV SOLN
7.5000 mg/kg | Freq: Once | INTRAVENOUS | Status: AC
  Administered 2012-03-05: 600 mg via INTRAVENOUS
  Filled 2012-03-05: qty 24

## 2012-03-05 MED ORDER — OXALIPLATIN CHEMO INJECTION 100 MG/20ML
130.0000 mg/m2 | Freq: Once | INTRAVENOUS | Status: AC
  Administered 2012-03-05: 265 mg via INTRAVENOUS
  Filled 2012-03-05: qty 53

## 2012-03-05 MED ORDER — HEPARIN SOD (PORK) LOCK FLUSH 100 UNIT/ML IV SOLN
500.0000 [IU] | Freq: Once | INTRAVENOUS | Status: AC | PRN
  Administered 2012-03-05: 500 [IU]
  Filled 2012-03-05: qty 5

## 2012-03-05 MED ORDER — DEXTROSE 5 % IV SOLN
Freq: Once | INTRAVENOUS | Status: AC
  Administered 2012-03-05: 12:00:00 via INTRAVENOUS

## 2012-03-05 MED ORDER — SODIUM CHLORIDE 0.9 % IJ SOLN
10.0000 mL | INTRAMUSCULAR | Status: DC | PRN
  Administered 2012-03-05: 10 mL
  Filled 2012-03-05: qty 10

## 2012-03-05 MED ORDER — DEXAMETHASONE SODIUM PHOSPHATE 10 MG/ML IJ SOLN
10.0000 mg | Freq: Once | INTRAMUSCULAR | Status: AC
  Administered 2012-03-05: 10 mg via INTRAVENOUS

## 2012-03-05 NOTE — Progress Notes (Signed)
OFFICE PROGRESS NOTE  Interval history:  Lawrence Villa returns as scheduled. He completed cycle 2 CAPOX beginning 02/13/2012. He denies nausea/vomiting. No diarrhea. Cold sensitivity lasted approximately 5 days. No persistent neuropathy symptoms. He denies mouth sores. He notes improvement in the cracking at the angle of the lips. He has a good appetite. He denies hand or foot pain or redness. He reports developing blisters on the second toe of each foot. He recently began a running program and thinks the blisters are related to this. No shortness of breath or chest pain. No leg swelling or calf pain. He denies bleeding. He has intermittent mild abdominal pain which he attributes to "anxiety".  He reports his father has a history of "colon polyps".  Objective: Blood pressure 113/78, pulse 76, temperature 96.9 F (36.1 C), temperature source Oral, resp. rate 20, height 6' (1.829 m), weight 174 lb 12.8 oz (79.289 kg).  Oropharynx is without thrush or ulceration. Mild angular chelitis. Lungs are clear. Regular cardiac rhythm. Port-A-Cath site is without erythema. Abdomen is soft. Mildly tender at the right upper quadrant. No hepatomegaly. Healed midline incision. Extremities are without edema. Calves are soft and nontender. Vibratory sense is intact over the fingertips per tuning fork exam. Healed blisterlike lesion at the tip of the left second toe. Palms and soles are without erythema.  Lab Results: Lab Results  Component Value Date   WBC 5.2 03/05/2012   HGB 13.6 03/05/2012   HCT 40.1 03/05/2012   MCV 85.3 03/05/2012   PLT 259 03/05/2012    Chemistry:    Chemistry      Component Value Date/Time   NA 140 02/13/2012 0948   NA 135 01/09/2012 1130   K 4.2 02/13/2012 0948   K 4.6 01/09/2012 1130   CL 106 02/13/2012 0948   CL 97 01/09/2012 1130   CO2 23 02/13/2012 0948   CO2 29 01/09/2012 1130   BUN 20.0 02/13/2012 0948   BUN 16 01/09/2012 1130   CREATININE 0.8 02/13/2012 0948   CREATININE 1.00  01/09/2012 1130      Component Value Date/Time   CALCIUM 9.4 02/13/2012 0948   CALCIUM 9.5 01/09/2012 1130   ALKPHOS 84 02/13/2012 0948   ALKPHOS 148* 01/09/2012 1130   AST 30 02/13/2012 0948   AST 40* 01/09/2012 1130   ALT 27 02/13/2012 0948   ALT 19 01/09/2012 1130   BILITOT 0.60 02/13/2012 0948   BILITOT 0.7 01/09/2012 1130       Studies/Results: No results found.  Medications: I have reviewed the patient's current medications.  Assessment/Plan:  1. Metastatic colon cancer status post low anterior resection on 12/22/2011 (T4, N2, M1). Initiation of CAPOX/Avastin on 01/23/2012. 2. Microcytic anemia, improved. He continues oral iron. 3. Weight loss secondary to metastatic colon cancer-improved. 4. Status post Port-A-Cath placement 01/10/2012.  Disposition-Lawrence Villa appears stable. Plan to proceed with cycle 3 CAPOX today as scheduled. We will followup on the CEA from today. He will return for a followup visit and cycle 4 CAPOX in 3 weeks. He will contact the office in the interim with any problems. We specifically discussed symptoms of hand/foot syndrome.  Plan reviewed with Dr. Truett Perna.  Lonna Cobb ANP/GNP-BC

## 2012-03-05 NOTE — Patient Instructions (Signed)
[facility] Discharge Instructions for Patients Receiving Chemotherapy  Today you received the following chemotherapy agents Avastin/Oxaliplatin To help prevent nausea and vomiting after your treatment, we encourage you to take your nausea medication as prescribed.  If you develop nausea and vomiting that is not controlled by your nausea medication, call the clinic. If it is after clinic hours your family physician or the after hours number for the clinic or go to the Emergency Department.   BELOW ARE SYMPTOMS THAT SHOULD BE REPORTED IMMEDIATELY:  *FEVER GREATER THAN 100.5 F  *CHILLS WITH OR WITHOUT FEVER  NAUSEA AND VOMITING THAT IS NOT CONTROLLED WITH YOUR NAUSEA MEDICATION  *UNUSUAL SHORTNESS OF BREATH  *UNUSUAL BRUISING OR BLEEDING  TENDERNESS IN MOUTH AND THROAT WITH OR WITHOUT PRESENCE OF ULCERS  *URINARY PROBLEMS  *BOWEL PROBLEMS  UNUSUAL RASH Items with * indicate a potential emergency and should be followed up as soon as possible.  One of the nurses will contact you 24 hours after your treatment. Please let the nurse know about any problems that you may have experienced. Feel free to call the clinic you have any questions or concerns. The clinic phone number is [phone].   I have been informed and understand all the instructions given to me. I know to contact the clinic, my physician, or go to the Emergency Department if any problems should occur. I do not have any questions at this time, but understand that I may call the clinic during office hours   should I have any questions or need assistance in obtaining follow up care.    __________________________________________  _____________  __________ Signature of Patient or Authorized Representative            Date                   Time    __________________________________________ Nurse's Signature    

## 2012-03-05 NOTE — Telephone Encounter (Signed)
appt made and printed for pt aom °

## 2012-03-05 NOTE — Telephone Encounter (Signed)
Per staff phone call and POF I have scheduled appts. JWM

## 2012-03-05 NOTE — Progress Notes (Signed)
I spoke with patient briefly today. He reports he is doing great. He has begun an exercise program. His weight has decreased 174.8 pounds from 178.3 pounds October 1. This may reflect increased activity level.  Nutrition diagnosis: Unintended weight loss continues.  Intervention: I continue to provide education and resources for patient to achieve goals of weight maintenance. Patient has my contact information for questions or concerns.  Monitoring, evaluation, goals: Patient has lost approximately 4 pounds however he feels good. He will continue to consume adequate calories and protein to promote weight maintenance.   Next visit: Tuesday, November 12, during chemotherapy.

## 2012-03-08 ENCOUNTER — Encounter: Payer: Self-pay | Admitting: *Deleted

## 2012-03-19 ENCOUNTER — Encounter: Payer: Self-pay | Admitting: Specialist

## 2012-03-19 ENCOUNTER — Encounter: Payer: Self-pay | Admitting: Oncology

## 2012-03-19 NOTE — Progress Notes (Signed)
Per pt, he will make a pymnt of $105 on his next visit 03/26/12.

## 2012-03-19 NOTE — Progress Notes (Unsigned)
I met today with Mr. Fusselman, who asked to see me to talk about meditation as a possible aid for sleeping better.  He  Indicated that he worries a great deal, experiences anxiety, and as a result he has trouble getting to sleep.  We discussed several breathing techniques, and I gave him a meditation CD he might use to relax and focus before trying to sleep.  Mr. Albanese became tearful several times as he talked about his divorce and the changes he has experienced over the last several years.  He indicated he plans to see Dr. Noe Gens at [facility].  I also encouraged him to attend the GI Support Group in November.

## 2012-03-20 ENCOUNTER — Ambulatory Visit (INDEPENDENT_AMBULATORY_CARE_PROVIDER_SITE_OTHER): Payer: Federal, State, Local not specified - PPO | Admitting: Psychiatry

## 2012-03-20 DIAGNOSIS — F411 Generalized anxiety disorder: Secondary | ICD-10-CM

## 2012-03-20 DIAGNOSIS — F0630 Mood disorder due to known physiological condition, unspecified: Secondary | ICD-10-CM

## 2012-03-21 ENCOUNTER — Ambulatory Visit: Payer: Federal, State, Local not specified - PPO | Admitting: Psychiatry

## 2012-03-24 ENCOUNTER — Other Ambulatory Visit: Payer: Self-pay | Admitting: Oncology

## 2012-03-26 ENCOUNTER — Other Ambulatory Visit (HOSPITAL_BASED_OUTPATIENT_CLINIC_OR_DEPARTMENT_OTHER): Payer: Federal, State, Local not specified - PPO

## 2012-03-26 ENCOUNTER — Ambulatory Visit (HOSPITAL_BASED_OUTPATIENT_CLINIC_OR_DEPARTMENT_OTHER): Payer: Federal, State, Local not specified - PPO

## 2012-03-26 ENCOUNTER — Ambulatory Visit (HOSPITAL_BASED_OUTPATIENT_CLINIC_OR_DEPARTMENT_OTHER): Payer: Federal, State, Local not specified - PPO | Admitting: Oncology

## 2012-03-26 ENCOUNTER — Telehealth: Payer: Self-pay | Admitting: Oncology

## 2012-03-26 ENCOUNTER — Ambulatory Visit: Payer: Federal, State, Local not specified - PPO | Admitting: Nutrition

## 2012-03-26 VITALS — BP 114/75 | HR 86 | Temp 96.5°F | Resp 20 | Ht 72.0 in | Wt 176.9 lb

## 2012-03-26 DIAGNOSIS — C787 Secondary malignant neoplasm of liver and intrahepatic bile duct: Secondary | ICD-10-CM

## 2012-03-26 DIAGNOSIS — Z5112 Encounter for antineoplastic immunotherapy: Secondary | ICD-10-CM

## 2012-03-26 DIAGNOSIS — C187 Malignant neoplasm of sigmoid colon: Secondary | ICD-10-CM

## 2012-03-26 DIAGNOSIS — C19 Malignant neoplasm of rectosigmoid junction: Secondary | ICD-10-CM

## 2012-03-26 DIAGNOSIS — Z5111 Encounter for antineoplastic chemotherapy: Secondary | ICD-10-CM

## 2012-03-26 DIAGNOSIS — D509 Iron deficiency anemia, unspecified: Secondary | ICD-10-CM

## 2012-03-26 DIAGNOSIS — L270 Generalized skin eruption due to drugs and medicaments taken internally: Secondary | ICD-10-CM

## 2012-03-26 LAB — CBC WITH DIFFERENTIAL/PLATELET
BASO%: 0.6 % (ref 0.0–2.0)
EOS%: 1 % (ref 0.0–7.0)
Eosinophils Absolute: 0.1 10*3/uL (ref 0.0–0.5)
HCT: 37.5 % — ABNORMAL LOW (ref 38.4–49.9)
LYMPH%: 18.1 % (ref 14.0–49.0)
MCH: 30.7 pg (ref 27.2–33.4)
MCHC: 34.4 g/dL (ref 32.0–36.0)
MCV: 89.3 fL (ref 79.3–98.0)
MONO%: 18 % — ABNORMAL HIGH (ref 0.0–14.0)
Platelets: 330 10*3/uL (ref 140–400)
RBC: 4.2 10*6/uL (ref 4.20–5.82)
WBC: 6.7 10*3/uL (ref 4.0–10.3)
nRBC: 0 % (ref 0–0)

## 2012-03-26 MED ORDER — DEXTROSE 5 % IV SOLN
Freq: Once | INTRAVENOUS | Status: AC
  Administered 2012-03-26: 13:00:00 via INTRAVENOUS

## 2012-03-26 MED ORDER — OXALIPLATIN CHEMO INJECTION 100 MG/20ML
130.0000 mg/m2 | Freq: Once | INTRAVENOUS | Status: AC
  Administered 2012-03-26: 265 mg via INTRAVENOUS
  Filled 2012-03-26: qty 53

## 2012-03-26 MED ORDER — SODIUM CHLORIDE 0.9 % IV SOLN
Freq: Once | INTRAVENOUS | Status: AC
Start: 2012-03-26 — End: 2012-03-26
  Administered 2012-03-26: 12:00:00 via INTRAVENOUS

## 2012-03-26 MED ORDER — SODIUM CHLORIDE 0.9 % IV SOLN
7.5000 mg/kg | Freq: Once | INTRAVENOUS | Status: AC
  Administered 2012-03-26: 600 mg via INTRAVENOUS
  Filled 2012-03-26: qty 24

## 2012-03-26 MED ORDER — ONDANSETRON 8 MG/50ML IVPB (CHCC)
8.0000 mg | Freq: Once | INTRAVENOUS | Status: AC
Start: 2012-03-26 — End: 2012-03-26
  Administered 2012-03-26: 8 mg via INTRAVENOUS

## 2012-03-26 MED ORDER — DEXAMETHASONE SODIUM PHOSPHATE 10 MG/ML IJ SOLN
10.0000 mg | Freq: Once | INTRAMUSCULAR | Status: AC
  Administered 2012-03-26: 10 mg via INTRAVENOUS

## 2012-03-26 MED ORDER — HEPARIN SOD (PORK) LOCK FLUSH 100 UNIT/ML IV SOLN
500.0000 [IU] | Freq: Once | INTRAVENOUS | Status: AC | PRN
  Administered 2012-03-26: 500 [IU]
  Filled 2012-03-26: qty 5

## 2012-03-26 MED ORDER — SODIUM CHLORIDE 0.9 % IJ SOLN
10.0000 mL | INTRAMUSCULAR | Status: DC | PRN
  Administered 2012-03-26: 10 mL
  Filled 2012-03-26: qty 10

## 2012-03-26 NOTE — Progress Notes (Signed)
Patient reports doing well. His weight is increased 176.9 pounds November 12 from 174.8 pounds October 22. He is trying to eat a whole foods diet. He would like to be sure to incorporate foods that will help fight his cancer. He reports he is taking milk thistle today and has informed his doctor.  Nutrition diagnosis: Unintended weight loss resolved.  Patient will continue to follow a whole foods diet and will contact me for concerns. I will followup with him on Tuesday, December 3 during chemotherapy.

## 2012-03-26 NOTE — Progress Notes (Signed)
   [facility]    OFFICE PROGRESS NOTE   INTERVAL HISTORY:   He returns as scheduled. He completed a first cycle of CAPOX on 03/05/2012. No nausea, mouth sores, or diarrhea following chemotherapy. Cold sensitivity and "tingling "lasted 5-6 days following oxaliplatin. No neuropathy symptoms today. He has developed erythema, "blisters ", and mild discomfort at the palms and soles. He continues to work. Good appetite.  Objective:  Vital signs in last 24 hours:  Blood pressure 114/75, pulse 86, temperature 96.5 F (35.8 C), resp. rate 20, height 6' (1.829 m), weight 176 lb 14.4 oz (80.241 kg).    HEENT: No thrush or ulcers Resp: Lungs clear bilaterally Cardio: Regular rate and rhythm GI: No hepatomegaly, nontender Vascular: No leg edema Neuro: The vibratory sense is intact at the fingertips bilaterally  Skin: There is skin thickening, callus, and erythema at the palms and soles. There is a linear ulceration at the right third finger   Portacath/PICC-without erythema  Lab Results:  Lab Results  Component Value Date   WBC 6.7 03/26/2012   HGB 12.9* 03/26/2012   HCT 37.5* 03/26/2012   MCV 89.3 03/26/2012   PLT 330 03/26/2012   ANC 4.2 CEA on 03/05/2012-3.6   Medications: I have reviewed the patient's current medications.  Assessment/Plan:  1. Metastatic colon cancer status post low anterior resection on 12/22/2011 (T4, N2, M1). Initiation of CAPOX/Avastin on 01/23/2012. 2. Microcytic anemia, improved. He continues oral iron. 3. Weight loss secondary to metastatic colon cancer-improved. 4. Status post Port-A-Cath placement 01/10/2012. 5. 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     Disposition:  He has completed 3 cycles of CAPOX. The plan is to begin cycle 4 today. The Xeloda was dose reduced with this cycle. He will discontinue Xeloda and contact us if he develops  increased pain or skin changes at the hands or feet.  His clinical status has improved and the CEA has normalized. He will be scheduled for a restaging CT evaluation after this cycle. Mr. Lepore will return for an office visit and chemotherapy in 3 weeks.   Thornton Papas, MD  03/26/2012  11:45 AM

## 2012-03-26 NOTE — Telephone Encounter (Signed)
appts made and printed for pt Pt aware that tx will be added to 12/24 email to mw to add tx for 12/24

## 2012-03-26 NOTE — Patient Instructions (Addendum)
[facility] Discharge Instructions for Patients Receiving Chemotherapy  Today you received the following chemotherapy agents Avastin/Oxaliplatin To help prevent nausea and vomiting after your treatment, we encourage you to take your nausea medication as prescribed.  If you develop nausea and vomiting that is not controlled by your nausea medication, call the clinic. If it is after clinic hours your family physician or the after hours number for the clinic or go to the Emergency Department.   BELOW ARE SYMPTOMS THAT SHOULD BE REPORTED IMMEDIATELY:  *FEVER GREATER THAN 100.5 F  *CHILLS WITH OR WITHOUT FEVER  NAUSEA AND VOMITING THAT IS NOT CONTROLLED WITH YOUR NAUSEA MEDICATION  *UNUSUAL SHORTNESS OF BREATH  *UNUSUAL BRUISING OR BLEEDING  TENDERNESS IN MOUTH AND THROAT WITH OR WITHOUT PRESENCE OF ULCERS  *URINARY PROBLEMS  *BOWEL PROBLEMS  UNUSUAL RASH Items with * indicate a potential emergency and should be followed up as soon as possible.  One of the nurses will contact you 24 hours after your treatment. Please let the nurse know about any problems that you may have experienced. Feel free to call the clinic you have any questions or concerns. The clinic phone number is [phone].   I have been informed and understand all the instructions given to me. I know to contact the clinic, my physician, or go to the Emergency Department if any problems should occur. I do not have any questions at this time, but understand that I may call the clinic during office hours   should I have any questions or need assistance in obtaining follow up care.    __________________________________________  _____________  __________ Signature of Patient or Authorized Representative            Date                   Time    __________________________________________ Nurse's Signature    

## 2012-03-29 ENCOUNTER — Telehealth: Payer: Self-pay | Admitting: *Deleted

## 2012-03-29 NOTE — Telephone Encounter (Signed)
Call from pt, he has noticed increased bleeding from his nose when he blows. No spontaneous bleeding. Has also noticed confusion but thinks this may be related to some other things. Reviewed with Dr. Truett Perna: may be related to Avastin, no need to worry. Will monitor. Returned call to pt, he voiced understanding.

## 2012-04-03 ENCOUNTER — Telehealth: Payer: Self-pay | Admitting: *Deleted

## 2012-04-03 NOTE — Telephone Encounter (Signed)
Received call from pt stating "Xeloda causing mental problems"  States he is having "repetitive thoughts-- can't shake my thoughts" States he is also having "slight nose bleeds" and this is interfering with his sleep; denies fever/nausea.  Asked if he could stop this medicine.  Pt also states that he is taking Seroquel for sleep and wants to know if he should stop this first.  Pt did not want to go into what his "repetitive thoughts were.  Note to Dr. Truett Perna.    Called and spoke with pt; per Dr. Truett Perna does not think Xeloda is causing "repetitive thoughts"  Seroquel was prescribed by PCP; per Dr. Truett Perna OK with him if pt stops sleeping pill to see if this would make it better but instructed to stay on Xeloda at this time.  Encouraged pt to have humidifier or nasal spray for dry nasal passages.  Pt verbalized understanding of all instructions and confirmed appt for 04/16/12.

## 2012-04-12 ENCOUNTER — Encounter (HOSPITAL_COMMUNITY): Payer: Self-pay

## 2012-04-12 ENCOUNTER — Ambulatory Visit (HOSPITAL_COMMUNITY)
Admission: RE | Admit: 2012-04-12 | Discharge: 2012-04-12 | Disposition: A | Discharge status: Home / Self Care | Payer: Federal, State, Local not specified - PPO | Source: Ambulatory Visit | Attending: Oncology | Admitting: Oncology

## 2012-04-12 ENCOUNTER — Telehealth: Payer: Self-pay | Admitting: *Deleted

## 2012-04-12 DIAGNOSIS — C787 Secondary malignant neoplasm of liver and intrahepatic bile duct: Secondary | ICD-10-CM | POA: Insufficient documentation

## 2012-04-12 DIAGNOSIS — C19 Malignant neoplasm of rectosigmoid junction: Secondary | ICD-10-CM | POA: Insufficient documentation

## 2012-04-12 HISTORY — DX: Secondary malignant neoplasm of liver and intrahepatic bile duct: C78.7

## 2012-04-12 MED ORDER — IOHEXOL 300 MG/ML  SOLN: 100.0000 mL | Freq: Once | INTRAMUSCULAR | Status: AC | PRN

## 2012-04-12 NOTE — Telephone Encounter (Signed)
Message copied by Wandalee Ferdinand on Fri Apr 12, 2012  3:40 PM ------      Message from: Ladene Artist      Created: Fri Apr 12, 2012  8:26 AM       Please call patient, liver lesions are improved. F/u as scheduled

## 2012-04-12 NOTE — Telephone Encounter (Signed)
Made patient aware that liver lesions are improved. Will be glad to print copy of his report and give to him when he is in the office again.

## 2012-04-13 ENCOUNTER — Other Ambulatory Visit: Payer: Self-pay | Admitting: Oncology

## 2012-04-15 ENCOUNTER — Telehealth: Payer: Self-pay | Admitting: *Deleted

## 2012-04-15 NOTE — Telephone Encounter (Signed)
error 

## 2012-04-15 NOTE — Telephone Encounter (Signed)
Per patient voicemail message, he wants to cancel his treatment appt tomorrow. He will keep all other appts. I have transferred message to the desk RN and kept appt ibn computer. JMW

## 2012-04-16 ENCOUNTER — Other Ambulatory Visit: Payer: Federal, State, Local not specified - PPO

## 2012-04-16 ENCOUNTER — Ambulatory Visit: Payer: Federal, State, Local not specified - PPO | Admitting: Oncology

## 2012-04-16 ENCOUNTER — Ambulatory Visit: Payer: Federal, State, Local not specified - PPO

## 2012-04-16 ENCOUNTER — Encounter: Payer: Federal, State, Local not specified - PPO | Admitting: Nutrition

## 2012-04-17 ENCOUNTER — Telehealth: Payer: Self-pay | Admitting: *Deleted

## 2012-04-17 ENCOUNTER — Telehealth: Payer: Self-pay | Admitting: Oncology

## 2012-04-17 NOTE — Telephone Encounter (Signed)
Spoke with patient by phone to re-schedule the appointments missed on 04/16/12 and to assess for any needs or concerns.  Patient stated that because he works for the [employer] and  it is such a busy time of year, that he needs to take a break.  He stated he has to work and that he wants to stay on the treatment scheduled for 05/07/12.  This RN offered to schedule him sooner and he declined as he again stressed the need to work.  This RN stressed the importance of maintaining compliance with treatment, remaining on schedule, and that it could be harmful to his health to miss his treatments.  He verbalized understanding, but declined the offer to come in at a time sooner than 05/07/12 for treatment.  Patient is also aware of his most recent scans showing Improving hepatic metastases.  Explained to patient that Dr. Truett Perna stated he would qualify for disability and would be happy to fill forms out for his employer.  Patient declined and stated he prefers to continue working.  This RN encouraged patient to call back for any needs, concerns, or assistance.  He verbalized understanding and stated "I just need to take this one treatment break to get through the holidays at work.  I will be there on the 24th".

## 2012-04-17 NOTE — Telephone Encounter (Signed)
Returned pt's call re appts for 12/3 and 12/24. Per pt he just wanted to make sure we understood that even though he cx'd lb/fu/chemo for 12/3 he does intend to keep his appts for lb/fu/chemo 12/24. Message to desk nurse.

## 2012-04-22 ENCOUNTER — Encounter: Payer: Self-pay | Admitting: Nutrition

## 2012-04-22 NOTE — Progress Notes (Signed)
Patient left me a message on the telephone. He is requesting additional coupons for Ensure Plus to be mailed to him at his home address. I have confirmed his address and placed coupons in the mail for him today.

## 2012-05-02 ENCOUNTER — Encounter: Payer: Self-pay | Admitting: *Deleted

## 2012-05-05 ENCOUNTER — Other Ambulatory Visit: Payer: Self-pay | Admitting: Oncology

## 2012-05-07 ENCOUNTER — Ambulatory Visit (HOSPITAL_BASED_OUTPATIENT_CLINIC_OR_DEPARTMENT_OTHER): Payer: Federal, State, Local not specified - PPO | Admitting: Oncology

## 2012-05-07 ENCOUNTER — Telehealth: Payer: Self-pay | Admitting: *Deleted

## 2012-05-07 ENCOUNTER — Other Ambulatory Visit: Payer: Self-pay

## 2012-05-07 ENCOUNTER — Other Ambulatory Visit (HOSPITAL_BASED_OUTPATIENT_CLINIC_OR_DEPARTMENT_OTHER): Payer: Federal, State, Local not specified - PPO | Admitting: Lab

## 2012-05-07 ENCOUNTER — Ambulatory Visit (HOSPITAL_BASED_OUTPATIENT_CLINIC_OR_DEPARTMENT_OTHER): Payer: Federal, State, Local not specified - PPO

## 2012-05-07 ENCOUNTER — Telehealth: Payer: Self-pay | Admitting: Oncology

## 2012-05-07 VITALS — BP 122/85 | HR 56 | Temp 96.7°F | Resp 20 | Ht 72.0 in | Wt 184.2 lb

## 2012-05-07 DIAGNOSIS — C787 Secondary malignant neoplasm of liver and intrahepatic bile duct: Secondary | ICD-10-CM

## 2012-05-07 DIAGNOSIS — C187 Malignant neoplasm of sigmoid colon: Secondary | ICD-10-CM

## 2012-05-07 DIAGNOSIS — C19 Malignant neoplasm of rectosigmoid junction: Secondary | ICD-10-CM

## 2012-05-07 DIAGNOSIS — C185 Malignant neoplasm of splenic flexure: Secondary | ICD-10-CM

## 2012-05-07 DIAGNOSIS — Z5112 Encounter for antineoplastic immunotherapy: Secondary | ICD-10-CM

## 2012-05-07 LAB — CBC WITH DIFFERENTIAL/PLATELET
BASO%: 0.6 % (ref 0.0–2.0)
EOS%: 3.7 % (ref 0.0–7.0)
HCT: 43.9 % (ref 38.4–49.9)
LYMPH%: 30.6 % (ref 14.0–49.0)
MCH: 34 pg — ABNORMAL HIGH (ref 27.2–33.4)
MCHC: 34.9 g/dL (ref 32.0–36.0)
NEUT%: 55.7 % (ref 39.0–75.0)
RBC: 4.5 10*6/uL (ref 4.20–5.82)
lymph#: 1.5 10*3/uL (ref 0.9–3.3)
nRBC: 0 % (ref 0–0)

## 2012-05-07 LAB — COMPREHENSIVE METABOLIC PANEL (CC13)
AST: 30 U/L (ref 5–34)
Alkaline Phosphatase: 95 U/L (ref 40–150)
BUN: 14 mg/dL (ref 7.0–26.0)
Creatinine: 0.8 mg/dL (ref 0.7–1.3)
Potassium: 4.3 mEq/L (ref 3.5–5.1)

## 2012-05-07 MED ORDER — SODIUM CHLORIDE 0.9 % IV SOLN
7.5000 mg/kg | Freq: Once | INTRAVENOUS | Status: AC
  Administered 2012-05-07: 600 mg via INTRAVENOUS
  Filled 2012-05-07: qty 24

## 2012-05-07 MED ORDER — ONDANSETRON 8 MG/50ML IVPB (CHCC)
8.0000 mg | Freq: Once | INTRAVENOUS | Status: AC
  Administered 2012-05-07: 8 mg via INTRAVENOUS

## 2012-05-07 MED ORDER — DEXTROSE 5 % IV SOLN
Freq: Once | INTRAVENOUS | Status: AC
  Administered 2012-05-07: 11:00:00 via INTRAVENOUS

## 2012-05-07 MED ORDER — CAPECITABINE 500 MG PO TABS: ORAL_TABLET | ORAL | Status: DC

## 2012-05-07 MED ORDER — DEXAMETHASONE SODIUM PHOSPHATE 10 MG/ML IJ SOLN
10.0000 mg | Freq: Once | INTRAMUSCULAR | Status: AC
  Administered 2012-05-07: 10 mg via INTRAVENOUS

## 2012-05-07 MED ORDER — SODIUM CHLORIDE 0.9 % IJ SOLN
10.0000 mL | INTRAMUSCULAR | Status: DC | PRN
  Administered 2012-05-07: 10 mL
  Filled 2012-05-07: qty 10

## 2012-05-07 MED ORDER — SODIUM CHLORIDE 0.9 % IV SOLN
Freq: Once | INTRAVENOUS | Status: AC
  Administered 2012-05-07: 10:00:00 via INTRAVENOUS

## 2012-05-07 MED ORDER — HEPARIN SOD (PORK) LOCK FLUSH 100 UNIT/ML IV SOLN
500.0000 [IU] | Freq: Once | INTRAVENOUS | Status: AC | PRN
  Administered 2012-05-07: 500 [IU]
  Filled 2012-05-07: qty 5

## 2012-05-07 MED ORDER — OXALIPLATIN CHEMO INJECTION 100 MG/20ML
130.0000 mg/m2 | Freq: Once | INTRAVENOUS | Status: AC
  Administered 2012-05-07: 265 mg via INTRAVENOUS
  Filled 2012-05-07: qty 53

## 2012-05-07 NOTE — Telephone Encounter (Signed)
Per staff message and POF I have scheduled appts.  JMW  

## 2012-05-07 NOTE — Patient Instructions (Addendum)
[facility] Discharge Instructions for Patients Receiving Chemotherapy  Today you received the following chemotherapy agents Avastin and Oxaliplatin  To help prevent nausea and vomiting after your treatment, we encourage you to take your nausea medication as prescribed.   If you develop nausea and vomiting that is not controlled by your nausea medication, call the clinic. If it is after clinic hours your family physician or the after hours number for the clinic or go to the Emergency Department.   BELOW ARE SYMPTOMS THAT SHOULD BE REPORTED IMMEDIATELY:  *FEVER GREATER THAN 100.5 F  *CHILLS WITH OR WITHOUT FEVER  NAUSEA AND VOMITING THAT IS NOT CONTROLLED WITH YOUR NAUSEA MEDICATION  *UNUSUAL SHORTNESS OF BREATH  *UNUSUAL BRUISING OR BLEEDING  TENDERNESS IN MOUTH AND THROAT WITH OR WITHOUT PRESENCE OF ULCERS  *URINARY PROBLEMS  *BOWEL PROBLEMS  UNUSUAL RASH Items with * indicate a potential emergency and should be followed up as soon as possible.  One of the nurses will contact you 24 hours after your treatment. Please let the nurse know about any problems that you may have experienced. Feel free to call the clinic you have any questions or concerns. The clinic phone number is [phone].

## 2012-05-07 NOTE — Telephone Encounter (Signed)
gv and pritned appt schedule for pt for Jan and Feb 2014

## 2012-05-07 NOTE — Progress Notes (Signed)
    [facility]    OFFICE PROGRESS NOTE   INTERVAL HISTORY:   He returns for a scheduled visit. He was last treated with CAPOX on 03/26/2012. He canceled the next followup visit and schedule chemotherapy.  Lawrence Villa reports the hand/foot symptoms have resolved. No neuropathy symptoms. He continues to work. No difficulty with bowel or bladder function. No bleeding. No specific complaint today.  Objective:  Vital signs in last 24 hours:  Blood pressure 122/85, pulse 56, temperature 96.7 F (35.9 C), temperature source Oral, resp. rate 20, height 6' (1.829 m), weight 184 lb 3.2 oz (83.553 kg).    HEENT: No thrush or ulcers Resp: Lungs clear bilaterally Cardio: Regular rate and rhythm GI: No hepatomegaly, nontender Vascular: No leg edema Neuro: The vibratory sense is intact at the fingertips bilaterally  Skin: Mild skin thickening at the hands, no erythema or skin breakdown   Portacath/PICC-without erythema  Lab Results:  Lab Results  Component Value Date   WBC 4.9 05/07/2012   HGB 15.3 05/07/2012   HCT 43.9 05/07/2012   MCV 97.6 05/07/2012   PLT 319 05/07/2012   ANC 2.7  CEA on 03/05/2012-3.6  X-rays: CT of the abdomen and pelvis on 04/12/2012, compared to 12/18/2011-improved hepatic metastases    Medications: I have reviewed the patient's current medications.  Assessment/Plan: 1. Metastatic colon cancer status post low anterior resection on 12/22/2011 (T4, N2, M1). Initiation of CAPOX/Avastin on 01/23/2012.               -Restaging CT 04/12/2012 after 4 cycles of CAPOX/Avastin confirmed improvement in the hepatic metastases   -the CEA was normal on 03/05/2012 2. Microcytic anemia-resolved, he will discontinue iron 3. Weight loss secondary to metastatic colon cancer-improved. 4. Status post Port-A-Cath placement 01/10/2012. 5. 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  Resolved.   Disposition:  He has completed 4 cycles of CAPOX/Avastin. He tolerated the chemotherapy well aside from hand/foot syndrome. The Xeloda was dose reduced beginning with cycle 4. A restaging CT confirmed improvement in the hepatic metastases. Cycle 5 of CAPOX/Avastin was delayed secondary to a decision by Mr. Adkison. The plan is to proceed with cycle 5 of CAPOX/Avastin today. He will return for an office visit and chemotherapy in 3 weeks.  Thornton Papas, MD  05/07/2012  8:37 AM

## 2012-05-26 ENCOUNTER — Other Ambulatory Visit: Payer: Self-pay | Admitting: Oncology

## 2012-05-28 ENCOUNTER — Ambulatory Visit: Payer: Federal, State, Local not specified - PPO | Admitting: Nutrition

## 2012-05-28 ENCOUNTER — Other Ambulatory Visit (HOSPITAL_BASED_OUTPATIENT_CLINIC_OR_DEPARTMENT_OTHER): Payer: Federal, State, Local not specified - PPO | Admitting: Lab

## 2012-05-28 ENCOUNTER — Telehealth: Payer: Self-pay | Admitting: *Deleted

## 2012-05-28 ENCOUNTER — Ambulatory Visit (HOSPITAL_BASED_OUTPATIENT_CLINIC_OR_DEPARTMENT_OTHER): Payer: Federal, State, Local not specified - PPO | Admitting: Nurse Practitioner

## 2012-05-28 ENCOUNTER — Telehealth: Payer: Self-pay | Admitting: Oncology

## 2012-05-28 ENCOUNTER — Ambulatory Visit (HOSPITAL_BASED_OUTPATIENT_CLINIC_OR_DEPARTMENT_OTHER): Payer: Federal, State, Local not specified - PPO

## 2012-05-28 VITALS — BP 109/74 | HR 60

## 2012-05-28 VITALS — BP 109/84 | HR 66 | Temp 98.4°F | Resp 20 | Ht 72.0 in | Wt 192.0 lb

## 2012-05-28 DIAGNOSIS — C787 Secondary malignant neoplasm of liver and intrahepatic bile duct: Secondary | ICD-10-CM

## 2012-05-28 DIAGNOSIS — Z5111 Encounter for antineoplastic chemotherapy: Secondary | ICD-10-CM

## 2012-05-28 DIAGNOSIS — C187 Malignant neoplasm of sigmoid colon: Secondary | ICD-10-CM

## 2012-05-28 DIAGNOSIS — C19 Malignant neoplasm of rectosigmoid junction: Secondary | ICD-10-CM

## 2012-05-28 DIAGNOSIS — L270 Generalized skin eruption due to drugs and medicaments taken internally: Secondary | ICD-10-CM

## 2012-05-28 LAB — CBC WITH DIFFERENTIAL/PLATELET
Basophils Absolute: 0 10*3/uL (ref 0.0–0.1)
Eosinophils Absolute: 0.2 10*3/uL (ref 0.0–0.5)
HCT: 41.8 % (ref 38.4–49.9)
HGB: 14.6 g/dL (ref 13.0–17.1)
LYMPH%: 37 % (ref 14.0–49.0)
MCV: 99.1 fL — ABNORMAL HIGH (ref 79.3–98.0)
MONO%: 15 % — ABNORMAL HIGH (ref 0.0–14.0)
NEUT#: 2.1 10*3/uL (ref 1.5–6.5)
NEUT%: 42.9 % (ref 39.0–75.0)
Platelets: 187 10*3/uL (ref 140–400)

## 2012-05-28 LAB — COMPREHENSIVE METABOLIC PANEL (CC13)
ALT: 27 U/L (ref 0–55)
BUN: 22 mg/dL (ref 7.0–26.0)
CO2: 25 mEq/L (ref 22–29)
Calcium: 9 mg/dL (ref 8.4–10.4)
Chloride: 103 mEq/L (ref 98–107)
Creatinine: 0.8 mg/dL (ref 0.7–1.3)

## 2012-05-28 LAB — UA PROTEIN, DIPSTICK - CHCC: Protein, ur: NEGATIVE mg/dL

## 2012-05-28 MED ORDER — SODIUM CHLORIDE 0.9 % IV SOLN
Freq: Once | INTRAVENOUS | Status: AC
  Administered 2012-05-28: 13:00:00 via INTRAVENOUS

## 2012-05-28 MED ORDER — HEPARIN SOD (PORK) LOCK FLUSH 100 UNIT/ML IV SOLN
500.0000 [IU] | Freq: Once | INTRAVENOUS | Status: AC | PRN
  Administered 2012-05-28: 500 [IU]
  Filled 2012-05-28: qty 5

## 2012-05-28 MED ORDER — CAPECITABINE 500 MG PO TABS: 1000.0000 mg | ORAL_TABLET | Freq: Two times a day (BID) | ORAL | Status: DC

## 2012-05-28 MED ORDER — ONDANSETRON 8 MG/50ML IVPB (CHCC)
8.0000 mg | Freq: Once | INTRAVENOUS | Status: AC
  Administered 2012-05-28: 8 mg via INTRAVENOUS

## 2012-05-28 MED ORDER — OXALIPLATIN CHEMO INJECTION 100 MG/20ML
130.0000 mg/m2 | Freq: Once | INTRAVENOUS | Status: AC
  Administered 2012-05-28: 265 mg via INTRAVENOUS
  Filled 2012-05-28: qty 53

## 2012-05-28 MED ORDER — DEXAMETHASONE SODIUM PHOSPHATE 10 MG/ML IJ SOLN
10.0000 mg | Freq: Once | INTRAMUSCULAR | Status: AC
  Administered 2012-05-28: 10 mg via INTRAVENOUS

## 2012-05-28 MED ORDER — SODIUM CHLORIDE 0.9 % IJ SOLN
10.0000 mL | INTRAMUSCULAR | Status: DC | PRN
  Administered 2012-05-28: 10 mL
  Filled 2012-05-28: qty 10

## 2012-05-28 MED ORDER — DEXTROSE 5 % IV SOLN
Freq: Once | INTRAVENOUS | Status: AC
  Administered 2012-05-28: 14:00:00 via INTRAVENOUS

## 2012-05-28 MED ORDER — SODIUM CHLORIDE 0.9 % IV SOLN
7.5000 mg/kg | Freq: Once | INTRAVENOUS | Status: AC
  Administered 2012-05-28: 600 mg via INTRAVENOUS
  Filled 2012-05-28: qty 24

## 2012-05-28 NOTE — Addendum Note (Signed)
Addended by: Wandalee Ferdinand on: 05/28/2012 12:35 PM   Modules accepted: Orders

## 2012-05-28 NOTE — Progress Notes (Signed)
I spoke briefly with patient in chemotherapy. He is eating well. He has no nutrition concerns. His weight has increased 192 pounds on January 14.  There is no nutrition diagnosis at this time. I've encouraged patient to followup with me as needed. Patient is agreeable.

## 2012-05-28 NOTE — Telephone Encounter (Signed)
Scheduled patient for lab and chemo on February 2014, patient opt to get appt calendar from [facility] @ chemo room. Emailed Marcelino Duster regarding chemo

## 2012-05-28 NOTE — Telephone Encounter (Signed)
Per staff message and POF I have scheduled appts.  JMW  

## 2012-05-28 NOTE — Patient Instructions (Addendum)
[facility] Discharge Instructions for Patients Receiving Chemotherapy  Today you received the following chemotherapy agents: Avastin, Oxaliplatin  To help prevent nausea and vomiting after your treatment, we encourage you to take your nausea medication as directed by your MD. If you develop nausea and vomiting that is not controlled by your nausea medication, call the clinic. If it is after clinic hours your family physician or the after hours number for the clinic or go to the Emergency Department.   BELOW ARE SYMPTOMS THAT SHOULD BE REPORTED IMMEDIATELY:  *FEVER GREATER THAN 100.5 F  *CHILLS WITH OR WITHOUT FEVER  NAUSEA AND VOMITING THAT IS NOT CONTROLLED WITH YOUR NAUSEA MEDICATION  *UNUSUAL SHORTNESS OF BREATH  *UNUSUAL BRUISING OR BLEEDING  TENDERNESS IN MOUTH AND THROAT WITH OR WITHOUT PRESENCE OF ULCERS  *URINARY PROBLEMS  *BOWEL PROBLEMS  UNUSUAL RASH Items with * indicate a potential emergency and should be followed up as soon as possible.  Feel free to call the clinic you have any questions or concerns. The clinic phone number is [phone].

## 2012-05-28 NOTE — Progress Notes (Signed)
OFFICE PROGRESS NOTE  Interval history:  Mr. Buresh returns as scheduled. He completed cycle 5 CAPOX/Avastin beginning 05/07/2012. He had minimal nausea. No mouth sores. No diarrhea. He denies hand or foot pain. He notes mild redness over the hands and feet. His feet intermittently feel "hot". The cold sensitivity related to oxaliplatin lasted 3-5 days in the fingertips. He denies persistent neuropathy symptoms. He occasionally notes blood with nose blowing. No other bleeding. He denies abdominal pain. No shortness of breath or chest pain.   Objective: Blood pressure 109/84, pulse 66, temperature 98.4 F (36.9 C), temperature source Oral, resp. rate 20, height 6' (1.829 m), weight 192 lb (87.091 kg).  Oropharynx is without thrush or ulceration. Lungs are clear. Regular cardiac rhythm. Port-A-Cath site is without erythema. Abdomen is soft and nontender. No hepatomegaly. Extremities are without edema. Vibratory sense is mildly decreased over the fingertips. Palms and soles with mild erythema. No skin breakdown.  Lab Results: Lab Results  Component Value Date   WBC 4.9 05/28/2012   HGB 14.6 05/28/2012   HCT 41.8 05/28/2012   MCV 99.1* 05/28/2012   PLT 187 05/28/2012    Chemistry:    Chemistry      Component Value Date/Time   NA 139 05/07/2012 0807   NA 140 03/05/2012 0924   K 4.3 05/07/2012 0807   K 4.2 03/05/2012 0924   CL 104 05/07/2012 0807   CL 106 03/05/2012 0924   CO2 25 05/07/2012 0807   CO2 25 03/05/2012 0924   BUN 14.0 05/07/2012 0807   BUN 19 03/05/2012 0924   CREATININE 0.8 05/07/2012 0807   CREATININE 0.81 03/05/2012 0924      Component Value Date/Time   CALCIUM 9.5 05/07/2012 0807   CALCIUM 9.4 03/05/2012 0924   ALKPHOS 95 05/07/2012 0807   ALKPHOS 57 03/05/2012 0924   AST 30 05/07/2012 0807   AST 30 03/05/2012 0924   ALT 25 05/07/2012 0807   ALT 24 03/05/2012 0924   BILITOT 1.11 05/07/2012 0807   BILITOT 1.2 03/05/2012 0924       Studies/Results: No results  found.  Medications: I have reviewed the patient's current medications.  Assessment/Plan:  1. Metastatic colon cancer status post low anterior resection on 12/22/2011 (T4, N2, M1). Initiation of CAPOX/Avastin on 01/23/2012. The CEA was in normal range on 03/05/2012. Restaging CT 04/12/2012 after 4 cycles of CAPOX/Avastin confirmed improvement in the hepatic metastases. 2. Microcytic anemia-resolved. 3. Weight loss secondary to metastatic colon cancer. He is gaining weight. 4. Status post Port-A-Cath placement 01/10/2012. 5. 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 Hands and feet are mildly erythematous today. He understands to discontinue the Xeloda with progression of the erythema and or onset of pain.  Disposition-Mr. Desmith appears stable. He has completed 5 cycles of CAPOX/Avastin. Plan to proceed with cycle 6 today as scheduled. He will return for a followup visit and cycle 7 in 3 weeks. He will contact the office in the interim as outlined above or with any other problems.  Lonna Cobb ANP/GNP-BC

## 2012-05-29 ENCOUNTER — Other Ambulatory Visit: Payer: Self-pay | Admitting: Certified Registered Nurse Anesthetist

## 2012-06-11 ENCOUNTER — Encounter (INDEPENDENT_AMBULATORY_CARE_PROVIDER_SITE_OTHER): Payer: Self-pay

## 2012-06-12 ENCOUNTER — Encounter (INDEPENDENT_AMBULATORY_CARE_PROVIDER_SITE_OTHER): Payer: Self-pay

## 2012-06-16 ENCOUNTER — Other Ambulatory Visit: Payer: Self-pay | Admitting: Oncology

## 2012-06-18 ENCOUNTER — Other Ambulatory Visit (HOSPITAL_BASED_OUTPATIENT_CLINIC_OR_DEPARTMENT_OTHER): Payer: Federal, State, Local not specified - PPO | Admitting: Lab

## 2012-06-18 ENCOUNTER — Ambulatory Visit (HOSPITAL_BASED_OUTPATIENT_CLINIC_OR_DEPARTMENT_OTHER): Payer: Federal, State, Local not specified - PPO

## 2012-06-18 ENCOUNTER — Ambulatory Visit (HOSPITAL_BASED_OUTPATIENT_CLINIC_OR_DEPARTMENT_OTHER): Payer: Federal, State, Local not specified - PPO | Admitting: Oncology

## 2012-06-18 ENCOUNTER — Telehealth: Payer: Self-pay | Admitting: Oncology

## 2012-06-18 ENCOUNTER — Telehealth: Payer: Self-pay | Admitting: *Deleted

## 2012-06-18 VITALS — BP 135/82 | HR 62 | Temp 98.2°F | Resp 18 | Ht 72.0 in | Wt 193.2 lb

## 2012-06-18 DIAGNOSIS — C187 Malignant neoplasm of sigmoid colon: Secondary | ICD-10-CM

## 2012-06-18 DIAGNOSIS — Z5111 Encounter for antineoplastic chemotherapy: Secondary | ICD-10-CM

## 2012-06-18 DIAGNOSIS — C7800 Secondary malignant neoplasm of unspecified lung: Secondary | ICD-10-CM

## 2012-06-18 DIAGNOSIS — C19 Malignant neoplasm of rectosigmoid junction: Secondary | ICD-10-CM

## 2012-06-18 DIAGNOSIS — Z5112 Encounter for antineoplastic immunotherapy: Secondary | ICD-10-CM

## 2012-06-18 DIAGNOSIS — C787 Secondary malignant neoplasm of liver and intrahepatic bile duct: Secondary | ICD-10-CM

## 2012-06-18 DIAGNOSIS — L270 Generalized skin eruption due to drugs and medicaments taken internally: Secondary | ICD-10-CM

## 2012-06-18 DIAGNOSIS — R634 Abnormal weight loss: Secondary | ICD-10-CM

## 2012-06-18 LAB — COMPREHENSIVE METABOLIC PANEL (CC13)
ALT: 33 U/L (ref 0–55)
Albumin: 3.6 g/dL (ref 3.5–5.0)
CO2: 25 mEq/L (ref 22–29)
Chloride: 102 mEq/L (ref 98–107)
Glucose: 94 mg/dl (ref 70–99)
Potassium: 4.3 mEq/L (ref 3.5–5.1)
Sodium: 138 mEq/L (ref 136–145)
Total Bilirubin: 0.93 mg/dL (ref 0.20–1.20)
Total Protein: 7 g/dL (ref 6.4–8.3)

## 2012-06-18 LAB — CBC WITH DIFFERENTIAL/PLATELET
BASO%: 0.5 % (ref 0.0–2.0)
HCT: 40.9 % (ref 38.4–49.9)
MCHC: 35.2 g/dL (ref 32.0–36.0)
MONO#: 0.6 10*3/uL (ref 0.1–0.9)
NEUT%: 35.8 % — ABNORMAL LOW (ref 39.0–75.0)
WBC: 4.1 10*3/uL (ref 4.0–10.3)
lymph#: 1.9 10*3/uL (ref 0.9–3.3)

## 2012-06-18 LAB — UA PROTEIN, DIPSTICK - CHCC: Protein, ur: NEGATIVE mg/dL

## 2012-06-18 MED ORDER — SODIUM CHLORIDE 0.9 % IV SOLN
Freq: Once | INTRAVENOUS | Status: AC
  Administered 2012-06-18: 10:00:00 via INTRAVENOUS

## 2012-06-18 MED ORDER — HEPARIN SOD (PORK) LOCK FLUSH 100 UNIT/ML IV SOLN
500.0000 [IU] | Freq: Once | INTRAVENOUS | Status: AC | PRN
  Administered 2012-06-18: 500 [IU]
  Filled 2012-06-18: qty 5

## 2012-06-18 MED ORDER — ONDANSETRON 8 MG/50ML IVPB (CHCC)
8.0000 mg | Freq: Once | INTRAVENOUS | Status: AC
  Administered 2012-06-18: 8 mg via INTRAVENOUS

## 2012-06-18 MED ORDER — SODIUM CHLORIDE 0.9 % IV SOLN
7.5000 mg/kg | Freq: Once | INTRAVENOUS | Status: AC
  Administered 2012-06-18: 600 mg via INTRAVENOUS
  Filled 2012-06-18: qty 24

## 2012-06-18 MED ORDER — SODIUM CHLORIDE 0.9 % IJ SOLN
10.0000 mL | INTRAMUSCULAR | Status: DC | PRN
  Administered 2012-06-18: 10 mL
  Filled 2012-06-18: qty 10

## 2012-06-18 MED ORDER — DEXAMETHASONE SODIUM PHOSPHATE 10 MG/ML IJ SOLN
10.0000 mg | Freq: Once | INTRAMUSCULAR | Status: AC
  Administered 2012-06-18: 10 mg via INTRAVENOUS

## 2012-06-18 MED ORDER — OXALIPLATIN CHEMO INJECTION 100 MG/20ML
130.0000 mg/m2 | Freq: Once | INTRAVENOUS | Status: AC
  Administered 2012-06-18: 265 mg via INTRAVENOUS
  Filled 2012-06-18: qty 53

## 2012-06-18 MED ORDER — DEXTROSE 5 % IV SOLN
Freq: Once | INTRAVENOUS | Status: AC
  Administered 2012-06-18: 11:00:00 via INTRAVENOUS

## 2012-06-18 NOTE — Telephone Encounter (Signed)
gv and printed appt schedule for pt for Feb and march...s/w and emailed  michelle to add tx.Marland KitchenMarland KitchenMarland KitchenMarland Kitchenpt aware

## 2012-06-18 NOTE — Patient Instructions (Addendum)
[facility] Discharge Instructions for Patients Receiving Chemotherapy  Today you received the following chemotherapy agents Avastin/Oxaliplatin To help prevent nausea and vomiting after your treatment, we encourage you to take your nausea medication as prescribed.  If you develop nausea and vomiting that is not controlled by your nausea medication, call the clinic. If it is after clinic hours your family physician or the after hours number for the clinic or go to the Emergency Department.   BELOW ARE SYMPTOMS THAT SHOULD BE REPORTED IMMEDIATELY:  *FEVER GREATER THAN 100.5 F  *CHILLS WITH OR WITHOUT FEVER  NAUSEA AND VOMITING THAT IS NOT CONTROLLED WITH YOUR NAUSEA MEDICATION  *UNUSUAL SHORTNESS OF BREATH  *UNUSUAL BRUISING OR BLEEDING  TENDERNESS IN MOUTH AND THROAT WITH OR WITHOUT PRESENCE OF ULCERS  *URINARY PROBLEMS  *BOWEL PROBLEMS  UNUSUAL RASH Items with * indicate a potential emergency and should be followed up as soon as possible.  One of the nurses will contact you 24 hours after your treatment. Please let the nurse know about any problems that you may have experienced. Feel free to call the clinic you have any questions or concerns. The clinic phone number is [phone].   I have been informed and understand all the instructions given to me. I know to contact the clinic, my physician, or go to the Emergency Department if any problems should occur. I do not have any questions at this time, but understand that I may call the clinic during office hours   should I have any questions or need assistance in obtaining follow up care.    __________________________________________  _____________  __________ Signature of Patient or Authorized Representative            Date                   Time    __________________________________________ Nurse's Signature

## 2012-06-18 NOTE — Telephone Encounter (Signed)
Per staff phone call and POF I have schedueld appts.  JMW  

## 2012-06-18 NOTE — Progress Notes (Signed)
   [facility]    OFFICE PROGRESS NOTE   INTERVAL HISTORY:   He returns as scheduled. He completed another cycle of CAPOX beginning on 05/28/2012. He reports anorexia and numbness/tingling over the week following chemotherapy. This has resolved. No mouth sores or hand/foot pain. No diarrhea. He notes intermittent fullness in the right upper abdomen. Mild nose bleeding.  Objective:  Vital signs in last 24 hours:  Blood pressure 135/82, pulse 62, temperature 98.2 F (36.8 C), temperature source Oral, resp. rate 18, height 6' (1.829 m), weight 193 lb 3.2 oz (87.635 kg).    HEENT: No thrush or ulcers Resp: Lungs clear bilaterally Cardio: Regular rate and rhythm GI: No hepatomegaly, nontender, no mass Vascular: No leg edema Neuro: The vibratory sense is intact at the fingertips bilaterally  Skin: Mild hyperpigmentation/erythema at the palms and soles without skin breakdown   Portacath/PICC-without erythema  Lab Results:  Lab Results  Component Value Date   WBC 4.1 06/18/2012   HGB 14.4 06/18/2012   HCT 40.9 06/18/2012   MCV 100.5* 06/18/2012   PLT 194 06/18/2012   ANC 1.5  CEA on 05/28/2012-1.4 Medications: I have reviewed the patient's current medications.  Assessment/Plan: 1. Metastatic colon cancer status post low anterior resection on 12/22/2011 (T4, N2, M1). Initiation of CAPOX/Avastin on 01/23/2012. The CEA was in normal range on 03/05/2012. Restaging CT 04/12/2012 after 4 cycles of CAPOX/Avastin confirmed improvement in the hepatic metastases. 2. Microcytic anemia-resolved. 3. Weight loss secondary to metastatic colon cancer. Improved. 4. Status post Port-A-Cath placement 01/10/2012.       5.   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 The hand/foot syndrome has improved    Disposition:  He appears stable. The plan is to proceed with cycle 7 of CAPOX today. He will return for an  opposite visit and chemotherapy in 2 weeks. We will plan on discontinuing oxaliplatin after cycle 8. He will be scheduled for another restaging CT evaluation after cycle 8.   Thornton Papas, MD  06/18/2012  1:44 PM

## 2012-06-24 ENCOUNTER — Encounter: Payer: Self-pay | Admitting: *Deleted

## 2012-06-24 NOTE — Progress Notes (Signed)
RECEIVED A FAX FROM [facility] CONCERNING AN INITIAL DISPENSE CONFIRMATION FOR XELODA ON 06/21/12.

## 2012-07-07 ENCOUNTER — Other Ambulatory Visit: Payer: Self-pay | Admitting: Oncology

## 2012-07-09 ENCOUNTER — Telehealth: Payer: Self-pay | Admitting: *Deleted

## 2012-07-09 ENCOUNTER — Ambulatory Visit (HOSPITAL_BASED_OUTPATIENT_CLINIC_OR_DEPARTMENT_OTHER): Payer: Federal, State, Local not specified - PPO | Admitting: Oncology

## 2012-07-09 ENCOUNTER — Telehealth: Payer: Self-pay | Admitting: Oncology

## 2012-07-09 ENCOUNTER — Ambulatory Visit (HOSPITAL_BASED_OUTPATIENT_CLINIC_OR_DEPARTMENT_OTHER): Payer: Federal, State, Local not specified - PPO

## 2012-07-09 ENCOUNTER — Other Ambulatory Visit (HOSPITAL_BASED_OUTPATIENT_CLINIC_OR_DEPARTMENT_OTHER): Payer: Federal, State, Local not specified - PPO | Admitting: Lab

## 2012-07-09 VITALS — BP 134/89 | HR 67 | Temp 97.5°F | Resp 18 | Ht 72.0 in | Wt 195.4 lb

## 2012-07-09 VITALS — BP 136/87 | HR 53

## 2012-07-09 DIAGNOSIS — C787 Secondary malignant neoplasm of liver and intrahepatic bile duct: Secondary | ICD-10-CM

## 2012-07-09 DIAGNOSIS — L270 Generalized skin eruption due to drugs and medicaments taken internally: Secondary | ICD-10-CM

## 2012-07-09 DIAGNOSIS — C187 Malignant neoplasm of sigmoid colon: Secondary | ICD-10-CM

## 2012-07-09 DIAGNOSIS — Z5112 Encounter for antineoplastic immunotherapy: Secondary | ICD-10-CM

## 2012-07-09 DIAGNOSIS — C19 Malignant neoplasm of rectosigmoid junction: Secondary | ICD-10-CM

## 2012-07-09 DIAGNOSIS — D709 Neutropenia, unspecified: Secondary | ICD-10-CM

## 2012-07-09 LAB — CBC WITH DIFFERENTIAL/PLATELET
Eosinophils Absolute: 0.1 10*3/uL (ref 0.0–0.5)
MONO#: 0.6 10*3/uL (ref 0.1–0.9)
MONO%: 15.9 % — ABNORMAL HIGH (ref 0.0–14.0)
NEUT#: 1.4 10*3/uL — ABNORMAL LOW (ref 1.5–6.5)
RBC: 4.22 10*6/uL (ref 4.20–5.82)
RDW: 16 % — ABNORMAL HIGH (ref 11.0–14.6)
WBC: 4 10*3/uL (ref 4.0–10.3)
nRBC: 0 % (ref 0–0)

## 2012-07-09 LAB — COMPREHENSIVE METABOLIC PANEL (CC13)
ALT: 30 U/L (ref 0–55)
AST: 42 U/L — ABNORMAL HIGH (ref 5–34)
Albumin: 3.5 g/dL (ref 3.5–5.0)
Alkaline Phosphatase: 59 U/L (ref 40–150)
BUN: 16.8 mg/dL (ref 7.0–26.0)
CO2: 26 mEq/L (ref 22–29)
Calcium: 8.9 mg/dL (ref 8.4–10.4)
Chloride: 105 mEq/L (ref 98–107)
Creatinine: 0.8 mg/dL (ref 0.7–1.3)
Glucose: 89 mg/dl (ref 70–99)
Potassium: 4.3 mEq/L (ref 3.5–5.1)
Sodium: 139 mEq/L (ref 136–145)
Total Bilirubin: 1.1 mg/dL (ref 0.20–1.20)
Total Protein: 6.9 g/dL (ref 6.4–8.3)

## 2012-07-09 MED ORDER — HEPARIN SOD (PORK) LOCK FLUSH 100 UNIT/ML IV SOLN
500.0000 [IU] | Freq: Once | INTRAVENOUS | Status: AC | PRN
  Administered 2012-07-09: 500 [IU]
  Filled 2012-07-09: qty 5

## 2012-07-09 MED ORDER — SODIUM CHLORIDE 0.9 % IV SOLN
Freq: Once | INTRAVENOUS | Status: AC
  Administered 2012-07-09: 10:00:00 via INTRAVENOUS

## 2012-07-09 MED ORDER — SODIUM CHLORIDE 0.9 % IJ SOLN
10.0000 mL | INTRAMUSCULAR | Status: DC | PRN
  Administered 2012-07-09: 10 mL
  Filled 2012-07-09: qty 10

## 2012-07-09 MED ORDER — SODIUM CHLORIDE 0.9 % IV SOLN
650.0000 mg | Freq: Once | INTRAVENOUS | Status: AC
  Administered 2012-07-09: 650 mg via INTRAVENOUS
  Filled 2012-07-09: qty 26

## 2012-07-09 NOTE — Telephone Encounter (Signed)
gv and printed appt schedule for pt for March and April.Marland Kitchens.w and emailed Marcelino Duster

## 2012-07-09 NOTE — Telephone Encounter (Signed)
Per staff message and POF I have scheduled appts.  JMW  

## 2012-07-09 NOTE — Patient Instructions (Signed)
[facility] Discharge Instructions for Patients Receiving Chemotherapy  Today you received the following chemotherapy agents Avastin To help prevent nausea and vomiting after your treatment, we encourage you to take your nausea medication as prescribed.  If you develop nausea and vomiting that is not controlled by your nausea medication, call the clinic. If it is after clinic hours your family physician or the after hours number for the clinic or go to the Emergency Department.   BELOW ARE SYMPTOMS THAT SHOULD BE REPORTED IMMEDIATELY:  *FEVER GREATER THAN 100.5 F  *CHILLS WITH OR WITHOUT FEVER  NAUSEA AND VOMITING THAT IS NOT CONTROLLED WITH YOUR NAUSEA MEDICATION  *UNUSUAL SHORTNESS OF BREATH  *UNUSUAL BRUISING OR BLEEDING  TENDERNESS IN MOUTH AND THROAT WITH OR WITHOUT PRESENCE OF ULCERS  *URINARY PROBLEMS  *BOWEL PROBLEMS  UNUSUAL RASH Items with * indicate a potential emergency and should be followed up as soon as possible.  One of the nurses will contact you 24 hours after your treatment. Please let the nurse know about any problems that you may have experienced. Feel free to call the clinic you have any questions or concerns. The clinic phone number is [phone].   I have been informed and understand all the instructions given to me. I know to contact the clinic, my physician, or go to the Emergency Department if any problems should occur. I do not have any questions at this time, but understand that I may call the clinic during office hours   should I have any questions or need assistance in obtaining follow up care.    __________________________________________  _____________  __________ Signature of Patient or Authorized Representative            Date                   Time    __________________________________________ Nurse's Signature    

## 2012-07-09 NOTE — Progress Notes (Signed)
   [facility]    OFFICE PROGRESS NOTE   INTERVAL HISTORY:   He returns as scheduled. He completed another cycle of CAPOX/Avastin beginning on 06/18/2012. He had cold sensitivity following chemotherapy. No neuropathy symptoms today. No hand or foot pain. Mild nausea following chemotherapy. No diarrhea. He continues to work.  Objective:  Vital signs in last 24 hours:  Blood pressure 134/89, pulse 67, temperature 97.5 F (36.4 C), temperature source Oral, resp. rate 18, height 6' (1.829 m), weight 195 lb 6.4 oz (88.633 kg).    HEENT: No thrush or ulcers Resp: Lungs clear bilaterally Cardio: Regular rate and rhythm GI: No hepatomegaly, nontender Vascular: No leg edema Neuro: The vibratory sense is intact at the fingertips bilaterally  Skin: No erythema or skin breakdown at the hand    Portacath/PICC-without erythema  Lab Results:  Lab Results  Component Value Date   WBC 4.0 07/09/2012   HGB 15.0 07/09/2012   HCT 42.8 07/09/2012   MCV 101.4* 07/09/2012   PLT 162 07/09/2012   ANC 1.4 CEA on 05/28/2012-1.4   Medications: I have reviewed the patient's current medications.  Assessment/Plan: 1. Metastatic colon cancer status post low anterior resection on 12/22/2011 (T4, N2, M1). Initiation of CAPOX/Avastin on 01/23/2012. The CEA was in normal range on 05/28/2012. Restaging CT 04/12/2012 after 4 cycles of CAPOX/Avastin confirmed improvement in the hepatic metastases. 2. Microcytic anemia-resolved. 3. Weight loss secondary to metastatic colon cancer. Improved. 4. Status post Port-A-Cath placement 01/10/2012.       5.   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 The hand/foot syndrome has improved        6.   Mild neutropenia secondary   Disposition:  He has completed 7 cycles of CAPOX/Avastin. The CEA has normalized. We decided to discontinue oxaliplatin with cycle 8 secondary to the  mild neutropenia and high chemotherapy cumulative dose of oxaliplatin.  He will return for an office visit and the next cycle of Xeloda/Avastin on 07/30/2012. He will be scheduled for a restaging CT evaluation after the next office visit.   Thornton Papas, MD  07/09/2012  6:22 PM

## 2012-07-12 ENCOUNTER — Other Ambulatory Visit: Payer: Self-pay | Admitting: *Deleted

## 2012-07-12 DIAGNOSIS — C19 Malignant neoplasm of rectosigmoid junction: Secondary | ICD-10-CM

## 2012-07-12 DIAGNOSIS — C787 Secondary malignant neoplasm of liver and intrahepatic bile duct: Secondary | ICD-10-CM

## 2012-07-12 MED ORDER — CAPECITABINE 500 MG PO TABS: 1000.0000 mg | ORAL_TABLET | Freq: Two times a day (BID) | ORAL | Status: DC

## 2012-07-15 ENCOUNTER — Telehealth: Payer: Self-pay | Admitting: *Deleted

## 2012-07-15 NOTE — Telephone Encounter (Signed)
Reports he has not received his Xeloda shipment and needs our office to follow up with pharmacy. Per [facility], his med was shipped on 3/3 for delivery tomorrow. Patient reports he will miss #3 doses of Xeloda waiting for this. Inquired why he is running out in the middle of a cycle and he said he had meds left over from prior treatment that he had started to use for these cycles, which have thrown everything "off". Told him at next OV we will sit down and do the math on his pills and get the appropriate quantity ordered so he will not continue to run out in mid cycle.

## 2012-07-16 ENCOUNTER — Encounter (INDEPENDENT_AMBULATORY_CARE_PROVIDER_SITE_OTHER): Payer: Self-pay | Admitting: General Surgery

## 2012-07-16 ENCOUNTER — Ambulatory Visit (INDEPENDENT_AMBULATORY_CARE_PROVIDER_SITE_OTHER): Payer: Federal, State, Local not specified - PPO | Admitting: General Surgery

## 2012-07-16 VITALS — BP 120/80 | HR 60 | Temp 97.0°F | Resp 18 | Ht 72.0 in | Wt 196.4 lb

## 2012-07-16 DIAGNOSIS — C19 Malignant neoplasm of rectosigmoid junction: Secondary | ICD-10-CM

## 2012-07-16 NOTE — Progress Notes (Signed)
Patient ID: Lawrence Villa, male   DOB: [birth year], 52 y.o.   MRN: [id number] History: This gentleman underwent low anterior resection of a  cancer of the proximal rectum on 12/22/2011. He had multiple liver metastasis. A Port-A-Cath was placed by me on 01/10/2012. He recovered from the surgery without any complication. He has been receiving chemotherapy with Dr. Truett Perna. He has completed all of his Oxaliplatin and continues on Avastin and xeloda. He has gained weight back from 167 to 196. His CEA has gone from 80 to normal and his liver metastasis  are responding to chemotherapy and they are smaller by CT. He feels pretty good. Anxious. He has 2-3 formed stools per day. No blood. No abdominal pain. No incisional problems. Port-A-Cath has been working well.  ROS: 10 system review of systems is negative except as described above  Exam: Patient looks well. No distress. Weight 196 Neck no adenopathy or mass Lungs clear to auscultation bilaterally Heart regular rate and rhythm no murmur no ectopy Abdomen soft, nontender. Perhaps slight discomfort in right upper quadrant but no obvious mass. Lower midline scar well healed. 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 Truett Perna Recommend colonoscopy in September with Dr. Dulce Sellar. Return to see me in one year, sooner if there are any problems.   Angelia Mould. Derrell Lolling, M.D., [facility] General and Minimally invasive Surgery Breast and Colorectal Surgery Office:   [phone] Pager:   [phone]

## 2012-07-16 NOTE — Patient Instructions (Signed)
Your progress so far is excellent. You are gaining weight, your CEA has returned to normal, and the CT scans shows significant improvement in the size of your liver metastasis.  Your physical exam is normal. I am not sure that I can feel your liver anymore. The Port-A-Cath site looks good and your abdominal incision is well healed. There are no enlarged lymph nodes.  Continue regular followup with Dr. Truett Perna and continue chemotherapy as outlined.  Be sure to get your CT scan in April as scheduled.  I recommend followup colonoscopy with Dr. Dulce Sellar in August or September of this year.  Return to see Dr. Derrell Lolling in one year, sooner if there are any problems.

## 2012-07-23 ENCOUNTER — Other Ambulatory Visit: Payer: Federal, State, Local not specified - PPO | Admitting: Lab

## 2012-07-23 ENCOUNTER — Ambulatory Visit: Payer: Federal, State, Local not specified - PPO | Admitting: Nurse Practitioner

## 2012-07-23 ENCOUNTER — Ambulatory Visit: Payer: Federal, State, Local not specified - PPO

## 2012-07-27 ENCOUNTER — Other Ambulatory Visit: Payer: Self-pay | Admitting: Oncology

## 2012-07-30 ENCOUNTER — Ambulatory Visit (HOSPITAL_BASED_OUTPATIENT_CLINIC_OR_DEPARTMENT_OTHER): Payer: Federal, State, Local not specified - PPO | Admitting: Nurse Practitioner

## 2012-07-30 ENCOUNTER — Ambulatory Visit (HOSPITAL_BASED_OUTPATIENT_CLINIC_OR_DEPARTMENT_OTHER): Payer: Federal, State, Local not specified - PPO

## 2012-07-30 ENCOUNTER — Other Ambulatory Visit (HOSPITAL_BASED_OUTPATIENT_CLINIC_OR_DEPARTMENT_OTHER): Payer: Federal, State, Local not specified - PPO | Admitting: Lab

## 2012-07-30 ENCOUNTER — Telehealth: Payer: Self-pay | Admitting: Oncology

## 2012-07-30 VITALS — BP 129/82 | HR 60

## 2012-07-30 VITALS — BP 141/95 | HR 82 | Temp 97.3°F | Resp 20 | Ht 72.0 in | Wt 200.6 lb

## 2012-07-30 DIAGNOSIS — C787 Secondary malignant neoplasm of liver and intrahepatic bile duct: Secondary | ICD-10-CM

## 2012-07-30 DIAGNOSIS — C187 Malignant neoplasm of sigmoid colon: Secondary | ICD-10-CM

## 2012-07-30 DIAGNOSIS — Z5112 Encounter for antineoplastic immunotherapy: Secondary | ICD-10-CM

## 2012-07-30 DIAGNOSIS — C19 Malignant neoplasm of rectosigmoid junction: Secondary | ICD-10-CM

## 2012-07-30 DIAGNOSIS — Z5111 Encounter for antineoplastic chemotherapy: Secondary | ICD-10-CM

## 2012-07-30 LAB — CBC WITH DIFFERENTIAL/PLATELET
BASO%: 0.5 % (ref 0.0–2.0)
EOS%: 1.7 % (ref 0.0–7.0)
LYMPH%: 38 % (ref 14.0–49.0)
MCH: 35.6 pg — ABNORMAL HIGH (ref 27.2–33.4)
MCHC: 35.1 g/dL (ref 32.0–36.0)
MCV: 101.4 fL — ABNORMAL HIGH (ref 79.3–98.0)
MONO%: 10.1 % (ref 0.0–14.0)
NEUT#: 3.2 10*3/uL (ref 1.5–6.5)
Platelets: 177 10*3/uL (ref 140–400)
RBC: 4.3 10*6/uL (ref 4.20–5.82)
RDW: 15.3 % — ABNORMAL HIGH (ref 11.0–14.6)
nRBC: 0 % (ref 0–0)

## 2012-07-30 LAB — UA PROTEIN, DIPSTICK - CHCC: Protein, ur: NEGATIVE mg/dL

## 2012-07-30 MED ORDER — SODIUM CHLORIDE 0.9 % IJ SOLN
10.0000 mL | INTRAMUSCULAR | Status: DC | PRN
  Administered 2012-07-30: 10 mL
  Filled 2012-07-30: qty 10

## 2012-07-30 MED ORDER — SODIUM CHLORIDE 0.9 % IV SOLN
650.0000 mg | Freq: Once | INTRAVENOUS | Status: AC
  Administered 2012-07-30: 650 mg via INTRAVENOUS
  Filled 2012-07-30: qty 26

## 2012-07-30 MED ORDER — HEPARIN SOD (PORK) LOCK FLUSH 100 UNIT/ML IV SOLN
500.0000 [IU] | Freq: Once | INTRAVENOUS | Status: AC | PRN
  Administered 2012-07-30: 500 [IU]
  Filled 2012-07-30: qty 5

## 2012-07-30 NOTE — Patient Instructions (Signed)
[facility] Discharge Instructions for Patients Receiving Chemotherapy  Today you received the following chemotherapy agents Avastin To help prevent nausea and vomiting after your treatment, we encourage you to take your nausea medication as prescribed.  If you develop nausea and vomiting that is not controlled by your nausea medication, call the clinic. If it is after clinic hours your family physician or the after hours number for the clinic or go to the Emergency Department.   BELOW ARE SYMPTOMS THAT SHOULD BE REPORTED IMMEDIATELY:  *FEVER GREATER THAN 100.5 F  *CHILLS WITH OR WITHOUT FEVER  NAUSEA AND VOMITING THAT IS NOT CONTROLLED WITH YOUR NAUSEA MEDICATION  *UNUSUAL SHORTNESS OF BREATH  *UNUSUAL BRUISING OR BLEEDING  TENDERNESS IN MOUTH AND THROAT WITH OR WITHOUT PRESENCE OF ULCERS  *URINARY PROBLEMS  *BOWEL PROBLEMS  UNUSUAL RASH Items with * indicate a potential emergency and should be followed up as soon as possible.  One of the nurses will contact you 24 hours after your treatment. Please let the nurse know about any problems that you may have experienced. Feel free to call the clinic you have any questions or concerns. The clinic phone number is [phone].   I have been informed and understand all the instructions given to me. I know to contact the clinic, my physician, or go to the Emergency Department if any problems should occur. I do not have any questions at this time, but understand that I may call the clinic during office hours   should I have any questions or need assistance in obtaining follow up care.    __________________________________________  _____________  __________ Signature of Patient or Authorized Representative            Date                   Time    __________________________________________ Nurse's Signature    

## 2012-07-30 NOTE — Progress Notes (Signed)
OFFICE PROGRESS NOTE  Interval history:  Mr. Liptak returns as scheduled. He completed a cycle of Xeloda/Avastin beginning 07/09/2012. He had mild nausea. No vomiting. No diarrhea. Hands with stable redness. The fingertips and toes feel "different". He does not characterize the sensation as numbness. He does note intermittent tingling. He has a good appetite. He continues to gain weight. He notes blood with nose blowing. No other bleeding. He denies shortness of breath and chest pain. No leg swelling or calf pain. No abdominal pain.   Objective: Blood pressure 141/95, pulse 82, temperature 97.3 F (36.3 C), temperature source Oral, resp. rate 20, height 6' (1.829 m), weight 200 lb 9.6 oz (90.992 kg).  Oropharynx is without thrush or ulceration. Lungs are clear. Regular cardiac rhythm. Port-A-Cath site is without erythema. Abdomen is soft and nontender. No hepatomegaly. Extremities are without edema. Palms with mild erythema and skin thickening. No skin breakdown.  Lab Results: Lab Results  Component Value Date   WBC 6.5 07/30/2012   HGB 15.3 07/30/2012   HCT 43.6 07/30/2012   MCV 101.4* 07/30/2012   PLT 177 07/30/2012    Chemistry:    Chemistry      Component Value Date/Time   NA 139 07/09/2012 0848   NA 140 03/05/2012 0924   K 4.3 07/09/2012 0848   K 4.2 03/05/2012 0924   CL 105 07/09/2012 0848   CL 106 03/05/2012 0924   CO2 26 07/09/2012 0848   CO2 25 03/05/2012 0924   BUN 16.8 07/09/2012 0848   BUN 19 03/05/2012 0924   CREATININE 0.8 07/09/2012 0848   CREATININE 0.81 03/05/2012 0924      Component Value Date/Time   CALCIUM 8.9 07/09/2012 0848   CALCIUM 9.4 03/05/2012 0924   ALKPHOS 59 07/09/2012 0848   ALKPHOS 57 03/05/2012 0924   AST 42* 07/09/2012 0848   AST 30 03/05/2012 0924   ALT 30 07/09/2012 0848   ALT 24 03/05/2012 0924   BILITOT 1.10 07/09/2012 0848   BILITOT 1.2 03/05/2012 0924     CEA pending.  Studies/Results: No results found.  Medications: I have reviewed the  patient's current medications.  Assessment/Plan:  1. Metastatic colon cancer status post low anterior resection on 12/22/2011 (T4, N2, M1). Initiation of CAPOX/Avastin on 01/23/2012. The CEA was in normal range on 05/28/2012. Restaging CT 04/12/2012 after 4 cycles of CAPOX/Avastin confirmed improvement in the hepatic metastases. He completed cycle 7 CAPOX/Avastin beginning 06/18/2012. He completed cycle 8 systemic therapy with Xeloda and Avastin (oxaliplatin discontinued) beginning 07/09/2012. 2. Microcytic anemia-resolved. 3. Weight loss secondary to metastatic colon cancer. Improved. He is gaining weight. 4. Status post Port-A-Cath placement 01/10/2012. 5. Hand-foot syndrome secondary to Xeloda. The Xeloda was dose reduced to 1000 mg twice daily beginning with cycle 4 CAPOX. The hand-foot syndrome has improved. 6. Mild neutropenia 07/09/2012. The neutrophil count is in normal range today.  Disposition-Mr. Danowski appears stable. Plan to proceed with cycle 9 systemic therapy with Xeloda/Avastin today as scheduled. Restaging CT evaluation in 2 weeks. He will return for a followup visit and cycle 10 in 3 weeks. He will contact the office in the interim with any problems.  Plan reviewed with Dr. Truett Perna.   Lonna Cobb ANP/GNP-BC

## 2012-07-30 NOTE — Telephone Encounter (Signed)
GVE THE PT HIS April 2014 APPT CALENDAR ALONG WITH THE ORAL CONTRAST FOR THE CT SCAN APPT. PT IS AWARE THAT RAD DEPT  WILL CONTACT HIM WITH THAT APPT.

## 2012-08-13 ENCOUNTER — Other Ambulatory Visit (HOSPITAL_BASED_OUTPATIENT_CLINIC_OR_DEPARTMENT_OTHER): Payer: Federal, State, Local not specified - PPO | Admitting: Lab

## 2012-08-13 ENCOUNTER — Ambulatory Visit (HOSPITAL_COMMUNITY)
Admission: RE | Admit: 2012-08-13 | Discharge: 2012-08-13 | Disposition: A | Discharge status: Home / Self Care | Payer: Federal, State, Local not specified - PPO | Source: Ambulatory Visit | Attending: Nurse Practitioner | Admitting: Nurse Practitioner

## 2012-08-13 DIAGNOSIS — C187 Malignant neoplasm of sigmoid colon: Secondary | ICD-10-CM

## 2012-08-13 DIAGNOSIS — K5730 Diverticulosis of large intestine without perforation or abscess without bleeding: Secondary | ICD-10-CM | POA: Insufficient documentation

## 2012-08-13 DIAGNOSIS — C787 Secondary malignant neoplasm of liver and intrahepatic bile duct: Secondary | ICD-10-CM | POA: Insufficient documentation

## 2012-08-13 DIAGNOSIS — Z79899 Other long term (current) drug therapy: Secondary | ICD-10-CM | POA: Insufficient documentation

## 2012-08-13 DIAGNOSIS — C189 Malignant neoplasm of colon, unspecified: Secondary | ICD-10-CM | POA: Insufficient documentation

## 2012-08-13 LAB — CBC WITH DIFFERENTIAL/PLATELET
Basophils Absolute: 0 10*3/uL (ref 0.0–0.1)
Eosinophils Absolute: 0.1 10*3/uL (ref 0.0–0.5)
HGB: 14.9 g/dL (ref 13.0–17.1)
MCV: 103.9 fL — ABNORMAL HIGH (ref 79.3–98.0)
MONO#: 0.6 10*3/uL (ref 0.1–0.9)
MONO%: 11.1 % (ref 0.0–14.0)
NEUT#: 2.5 10*3/uL (ref 1.5–6.5)
RBC: 4.18 10*6/uL — ABNORMAL LOW (ref 4.20–5.82)
RDW: 16.3 % — ABNORMAL HIGH (ref 11.0–14.6)
WBC: 5.7 10*3/uL (ref 4.0–10.3)
lymph#: 2.4 10*3/uL (ref 0.9–3.3)

## 2012-08-13 LAB — COMPREHENSIVE METABOLIC PANEL (CC13)
Albumin: 3.8 g/dL (ref 3.5–5.0)
BUN: 19.9 mg/dL (ref 7.0–26.0)
CO2: 26 mEq/L (ref 22–29)
Calcium: 9.2 mg/dL (ref 8.4–10.4)
Chloride: 103 mEq/L (ref 98–107)
Glucose: 85 mg/dl (ref 70–99)
Potassium: 4.4 mEq/L (ref 3.5–5.1)
Sodium: 137 mEq/L (ref 136–145)
Total Protein: 7.4 g/dL (ref 6.4–8.3)

## 2012-08-13 MED ORDER — IOHEXOL 300 MG/ML  SOLN
100.0000 mL | Freq: Once | INTRAMUSCULAR | Status: AC | PRN
  Administered 2012-08-13: 100 mL via INTRAVENOUS

## 2012-08-16 ENCOUNTER — Telehealth: Payer: Self-pay | Admitting: *Deleted

## 2012-08-16 NOTE — Telephone Encounter (Signed)
Notified patient via VM that his CT showed improvement in liver lesions. F/U as scheduled on 4/8

## 2012-08-18 ENCOUNTER — Other Ambulatory Visit: Payer: Self-pay | Admitting: Oncology

## 2012-08-20 ENCOUNTER — Other Ambulatory Visit (HOSPITAL_BASED_OUTPATIENT_CLINIC_OR_DEPARTMENT_OTHER): Payer: Federal, State, Local not specified - PPO | Admitting: Lab

## 2012-08-20 ENCOUNTER — Ambulatory Visit: Payer: Federal, State, Local not specified - PPO

## 2012-08-20 ENCOUNTER — Ambulatory Visit (HOSPITAL_BASED_OUTPATIENT_CLINIC_OR_DEPARTMENT_OTHER): Payer: Federal, State, Local not specified - PPO | Admitting: Nurse Practitioner

## 2012-08-20 ENCOUNTER — Ambulatory Visit: Payer: Federal, State, Local not specified - PPO | Admitting: Oncology

## 2012-08-20 ENCOUNTER — Ambulatory Visit (HOSPITAL_BASED_OUTPATIENT_CLINIC_OR_DEPARTMENT_OTHER): Payer: Federal, State, Local not specified - PPO

## 2012-08-20 ENCOUNTER — Other Ambulatory Visit: Payer: Federal, State, Local not specified - PPO | Admitting: Lab

## 2012-08-20 VITALS — BP 136/86 | HR 69 | Temp 97.4°F | Resp 20 | Ht 72.0 in | Wt 203.3 lb

## 2012-08-20 VITALS — BP 132/79 | HR 55

## 2012-08-20 DIAGNOSIS — C187 Malignant neoplasm of sigmoid colon: Secondary | ICD-10-CM

## 2012-08-20 DIAGNOSIS — Z5112 Encounter for antineoplastic immunotherapy: Secondary | ICD-10-CM

## 2012-08-20 DIAGNOSIS — C787 Secondary malignant neoplasm of liver and intrahepatic bile duct: Secondary | ICD-10-CM

## 2012-08-20 DIAGNOSIS — C19 Malignant neoplasm of rectosigmoid junction: Secondary | ICD-10-CM

## 2012-08-20 LAB — UA PROTEIN, DIPSTICK - CHCC: Protein, ur: NEGATIVE mg/dL

## 2012-08-20 LAB — CBC WITH DIFFERENTIAL/PLATELET
Basophils Absolute: 0 10*3/uL (ref 0.0–0.1)
EOS%: 3.5 % (ref 0.0–7.0)
Eosinophils Absolute: 0.2 10*3/uL (ref 0.0–0.5)
HGB: 14.7 g/dL (ref 13.0–17.1)
MCV: 101.5 fL — ABNORMAL HIGH (ref 79.3–98.0)
MONO%: 11.3 % (ref 0.0–14.0)
NEUT#: 2.9 10*3/uL (ref 1.5–6.5)
RBC: 4.13 10*6/uL — ABNORMAL LOW (ref 4.20–5.82)
RDW: 14.6 % (ref 11.0–14.6)
lymph#: 2.3 10*3/uL (ref 0.9–3.3)
nRBC: 0 % (ref 0–0)

## 2012-08-20 MED ORDER — BEVACIZUMAB CHEMO INJECTION 400 MG/16ML
650.0000 mg | Freq: Once | INTRAVENOUS | Status: AC
  Administered 2012-08-20: 650 mg via INTRAVENOUS
  Filled 2012-08-20: qty 26

## 2012-08-20 MED ORDER — HEPARIN SOD (PORK) LOCK FLUSH 100 UNIT/ML IV SOLN
500.0000 [IU] | Freq: Once | INTRAVENOUS | Status: AC | PRN
  Administered 2012-08-20: 500 [IU]
  Filled 2012-08-20: qty 5

## 2012-08-20 MED ORDER — SODIUM CHLORIDE 0.9 % IJ SOLN
10.0000 mL | INTRAMUSCULAR | Status: DC | PRN
  Administered 2012-08-20: 10 mL
  Filled 2012-08-20: qty 10

## 2012-08-20 MED ORDER — SODIUM CHLORIDE 0.9 % IV SOLN
Freq: Once | INTRAVENOUS | Status: AC
  Administered 2012-08-20: 16:00:00 via INTRAVENOUS

## 2012-08-20 NOTE — Patient Instructions (Addendum)
[facility] Discharge Instructions for Patients Receiving Chemotherapy  Today you received the following chemotherapy agents avastin  To help prevent nausea and vomiting after your treatment, we encourage you to take your nausea medication if needed Begin taking it as you need and take it as prescribed   If you develop nausea and vomiting that is not controlled by your nausea medication, call the clinic. If it is after clinic hours your family physician or the after hours number for the clinic or go to the Emergency Department.   BELOW ARE SYMPTOMS THAT SHOULD BE REPORTED IMMEDIATELY:  *FEVER GREATER THAN 100.5 F  *CHILLS WITH OR WITHOUT FEVER  NAUSEA AND VOMITING THAT IS NOT CONTROLLED WITH YOUR NAUSEA MEDICATION  *UNUSUAL SHORTNESS OF BREATH  *UNUSUAL BRUISING OR BLEEDING  TENDERNESS IN MOUTH AND THROAT WITH OR WITHOUT PRESENCE OF ULCERS  *URINARY PROBLEMS  *BOWEL PROBLEMS  UNUSUAL RASH Items with * indicate a potential emergency and should be followed up as soon as possible.  Feel free to call the clinic you have any questions or concerns. The clinic phone number is [phone].   I have been informed and understand all the instructions given to me. I know to contact the clinic, my physician, or go to the Emergency Department if any problems should occur. I do not have any questions at this time, but understand that I may call the clinic during office hours   should I have any questions or need assistance in obtaining follow up care.    __________________________________________  _____________  __________ Signature of Patient or Authorized Representative            Date                   Time    __________________________________________ Nurse's Signature

## 2012-08-20 NOTE — Progress Notes (Signed)
OFFICE PROGRESS NOTE  Interval history:  Lawrence Villa returns as scheduled. He completed cycle 9 systemic therapy with Xeloda/Avastin beginning 07/30/2012. Restaging CT evaluation 08/13/2012 showed further improvement in liver metastases. No new or enlarging liver metastases were identified.  He denies nausea/vomiting. No mouth sores. No diarrhea. He notes "cracking" at the corners of the lips. Fingertips feel like they have "calluses". Palms with stable mild redness. He continues to note blood with nose blowing. No other bleeding. No shortness of breath or chest pain.   Objective: Blood pressure 136/86, pulse 69, temperature 97.4 F (36.3 C), temperature source Oral, resp. rate 20, height 6' (1.829 m), weight 203 lb 4.8 oz (92.216 kg).  Oropharynx is without thrush or ulceration. Lungs are clear. Regular cardiac rhythm. Port-A-Cath site is without erythema. Abdomen soft and nontender. No hepatomegaly. Extremities without edema. Palms with mild erythema and skin thickening. No skin breakdown.  Lab Results: Lab Results  Component Value Date   WBC 6.2 08/20/2012   HGB 14.7 08/20/2012   HCT 41.9 08/20/2012   MCV 101.5* 08/20/2012   PLT 177 08/20/2012    Chemistry:    Chemistry      Component Value Date/Time   NA 137 08/13/2012 1440   NA 140 03/05/2012 0924   K 4.4 08/13/2012 1440   K 4.2 03/05/2012 0924   CL 103 08/13/2012 1440   CL 106 03/05/2012 0924   CO2 26 08/13/2012 1440   CO2 25 03/05/2012 0924   BUN 19.9 08/13/2012 1440   BUN 19 03/05/2012 0924   CREATININE 0.9 08/13/2012 1440   CREATININE 0.81 03/05/2012 0924      Component Value Date/Time   CALCIUM 9.2 08/13/2012 1440   CALCIUM 9.4 03/05/2012 0924   ALKPHOS 63 08/13/2012 1440   ALKPHOS 57 03/05/2012 0924   AST 41* 08/13/2012 1440   AST 30 03/05/2012 0924   ALT 26 08/13/2012 1440   ALT 24 03/05/2012 0924   BILITOT 1.14 08/13/2012 1440   BILITOT 1.2 03/05/2012 0924       Studies/Results: Ct Abdomen Pelvis W Contrast  08/13/2012  *RADIOLOGY  REPORT*  Clinical Data: Follow-up metastatic colon carcinoma.  Ongoing chemotherapy.  Restaging.  CT ABDOMEN AND PELVIS WITH CONTRAST  Technique:  Multidetector CT imaging of the abdomen and pelvis was performed following the standard protocol during bolus administration of intravenous contrast.  Contrast: OMNIPAQUE IOHEXOL 300 MG/ML  SOLN  Comparison: 04/12/2012  Findings: Multiple hepatic metastases are again seen, several which are calcified.  These show decrease in size compared to previous study.  Index calcified lesion in the left hepatic lobe currently measures 2.0 x 2.3 cm compared to 2.6 x 2.7 cm previously.  Another calcified index lesion in the inferior right hepatic lobe currently measures 3.0 x 4.0 cm compared to 3.8 x 4.6 cm previously.  No new or enlarging liver metastases are identified.  Gallbladder is unremarkable.  No evidence of biliary ductal dilatation.  The pancreas, spleen, adrenal glands, and kidneys are normal in appearance.  No evidence of hydronephrosis.  No other soft tissue masses or lymphadenopathy identified within the abdomen or pelvis.  Diverticulosis is again demonstrated, however there is no evidence of diverticulitis.  Large colonic stool burden again noted and without significant change.  No evidence of dilated small bowel loops.  No evidence of inflammatory process or abnormal fluid collections.  No hernia identified.  No suspicious bone lesions identified.  IMPRESSION:  1.  Interval improvement in the liver metastases. 2.  No  new or progressive metastatic disease within the abdomen or pelvis. 3.  Diverticulosis.  No radiographic evidence of diverticulitis. 4. Persistent large colonic stool burden; suggest correlation for possible constipation.   Original Report Authenticated By: Myles Rosenthal, M.D.     Medications: I have reviewed the patient's current medications.  Assessment/Plan:  1. Metastatic colon cancer status post low anterior resection on 12/22/2011 (T4, N2,  M1). Initiation of CAPOX/Avastin on 01/23/2012. The CEA was in normal range on 05/28/2012. Restaging CT 04/12/2012 after 4 cycles of CAPOX/Avastin confirmed improvement in the hepatic metastases. He completed cycle 7 CAPOX/Avastin beginning 06/18/2012. He completed cycle 8 systemic therapy with Xeloda and Avastin (oxaliplatin discontinued) beginning 07/09/2012. He completed cycle 9 beginning 07/30/2012. Restaging CT evaluation 08/13/2012 with further improvement in liver metastases. 2. Microcytic anemia-resolved. 3. Weight loss secondary to metastatic colon cancer. Improved. He is gaining weight. 4. Status post Port-A-Cath placement 01/10/2012. 5. Hand-foot syndrome secondary to Xeloda. The Xeloda was dose reduced to 1000 mg twice daily beginning with cycle 4 CAPOX. The hand-foot symptoms have improved. 6. Mild neutropenia 07/09/2012.   Disposition-Mr. Else appears stable. He has completed 9 cycles of systemic therapy. Current regimen is Xeloda/Avastin on a 3 week schedule. He overall appears to be tolerating the treatment well. Restaging CT evaluation on 08/13/2012 showed further decrease in the size of liver metastases.  At today's visit he voiced his desire to modify the treatment regimen. Eventually he is interested in discontinuing the treatment completely. Dr. Truett Perna and I encouraged him to continue on the current regimen as the CT scan continues to show objective improvement and clinically he is significantly better as compared to time of initial diagnosis.  He is agreeable to continuing the current regimen for now. He would like to discuss treatment modification again when he returns in 3 weeks.  Plan to proceed with cycle 10 systemic therapy with Xeloda/Avastin today as scheduled. He will return for a followup visit and cycle 11 in 3 weeks. He will contact the office in the interim with any problems.  Patient seen with Dr. Truett Perna.  Lonna Cobb ANP/GNP-BC

## 2012-08-21 ENCOUNTER — Telehealth: Payer: Self-pay | Admitting: *Deleted

## 2012-08-21 ENCOUNTER — Telehealth: Payer: Self-pay | Admitting: Oncology

## 2012-08-21 NOTE — Telephone Encounter (Signed)
Called pt left message regarding lab, md and chemo on APril and May 201

## 2012-08-21 NOTE — Telephone Encounter (Signed)
Per staff message and POF I have scheduled appts.  JMW  

## 2012-08-22 ENCOUNTER — Other Ambulatory Visit: Payer: Self-pay | Admitting: *Deleted

## 2012-08-22 DIAGNOSIS — C19 Malignant neoplasm of rectosigmoid junction: Secondary | ICD-10-CM

## 2012-08-22 DIAGNOSIS — C787 Secondary malignant neoplasm of liver and intrahepatic bile duct: Secondary | ICD-10-CM

## 2012-08-22 NOTE — Telephone Encounter (Signed)
THIS REFILL REQUEST FOR XELODA WAS GIVEN TO DR.SHERRILL'S NURSE, SUSAN COWARD,RN.

## 2012-08-23 MED ORDER — CAPECITABINE 500 MG PO TABS: 1000.0000 mg | ORAL_TABLET | Freq: Two times a day (BID) | ORAL | Status: DC

## 2012-08-23 NOTE — Addendum Note (Signed)
Addended by: Wandalee Ferdinand on: 08/23/2012 06:16 PM   Modules accepted: Orders

## 2012-09-03 ENCOUNTER — Other Ambulatory Visit: Payer: Self-pay | Admitting: *Deleted

## 2012-09-03 DIAGNOSIS — C787 Secondary malignant neoplasm of liver and intrahepatic bile duct: Secondary | ICD-10-CM

## 2012-09-03 DIAGNOSIS — C19 Malignant neoplasm of rectosigmoid junction: Secondary | ICD-10-CM

## 2012-09-03 MED ORDER — CAPECITABINE 500 MG PO TABS: 1000.0000 mg | ORAL_TABLET | Freq: Two times a day (BID) | ORAL | Status: DC

## 2012-09-10 ENCOUNTER — Other Ambulatory Visit (HOSPITAL_BASED_OUTPATIENT_CLINIC_OR_DEPARTMENT_OTHER): Payer: Federal, State, Local not specified - PPO | Admitting: Lab

## 2012-09-10 ENCOUNTER — Ambulatory Visit (HOSPITAL_BASED_OUTPATIENT_CLINIC_OR_DEPARTMENT_OTHER): Payer: Federal, State, Local not specified - PPO | Admitting: Oncology

## 2012-09-10 ENCOUNTER — Ambulatory Visit: Payer: Federal, State, Local not specified - PPO

## 2012-09-10 VITALS — BP 125/92 | HR 64 | Temp 98.0°F | Resp 18 | Ht 72.0 in | Wt 207.3 lb

## 2012-09-10 DIAGNOSIS — C801 Malignant (primary) neoplasm, unspecified: Secondary | ICD-10-CM

## 2012-09-10 DIAGNOSIS — C787 Secondary malignant neoplasm of liver and intrahepatic bile duct: Secondary | ICD-10-CM

## 2012-09-10 DIAGNOSIS — C19 Malignant neoplasm of rectosigmoid junction: Secondary | ICD-10-CM

## 2012-09-10 LAB — CBC WITH DIFFERENTIAL/PLATELET
BASO%: 0.9 % (ref 0.0–2.0)
MCHC: 34.9 g/dL (ref 32.0–36.0)
MONO#: 0.7 10*3/uL (ref 0.1–0.9)
RBC: 4.36 10*6/uL (ref 4.20–5.82)
RDW: 14.8 % — ABNORMAL HIGH (ref 11.0–14.6)
WBC: 6.7 10*3/uL (ref 4.0–10.3)
lymph#: 2.8 10*3/uL (ref 0.9–3.3)

## 2012-09-10 LAB — COMPREHENSIVE METABOLIC PANEL (CC13)
BUN: 19.2 mg/dL (ref 7.0–26.0)
CO2: 23 mEq/L (ref 22–29)
Creatinine: 1 mg/dL (ref 0.7–1.3)
Glucose: 91 mg/dl (ref 70–99)
Total Bilirubin: 1.12 mg/dL (ref 0.20–1.20)

## 2012-09-10 NOTE — Progress Notes (Signed)
Per Dr. Truett Perna- no treatment today.

## 2012-09-10 NOTE — Progress Notes (Signed)
   [facility]    OFFICE PROGRESS NOTE   INTERVAL HISTORY:   He completed another cycle of Xeloda/Avastin beginning on 08/20/2012. He reports stable erythema at the hands and feet. He continues to have numbness in the hands and feet. No new complaint. He is working. No diarrhea or mouth sores.  Objective:  Vital signs in last 24 hours:  Blood pressure 125/92, pulse 64, temperature 98 F (36.7 C), temperature source Oral, resp. rate 18, height 6' (1.829 m), weight 207 lb 4.8 oz (94.031 kg).    HEENT: No thrush or ulcers Resp: Lungs clear bilaterally Cardio: Regular rate and rhythm GI: No hepatomegaly, no mass Vascular: No leg edema  Skin: Mild erythema and skin thickening with a few areas of superficial desquamation over the hands. Mild erythema over the feet without skin breakdown   Portacath/PICC-without erythema  Lab Results:  Lab Results  Component Value Date   WBC 6.7 09/10/2012   HGB 15.9 09/10/2012   HCT 45.4 09/10/2012   MCV 104.2* 09/10/2012   PLT 175 09/10/2012   ANC 3.0   Medications: I have reviewed the patient's current medications.  Assessment/Plan: 1. Metastatic colon cancer status post low anterior resection on 12/22/2011 (T4, N2, M1). Initiation of CAPOX/Avastin on 01/23/2012. The CEA was in normal range on 05/28/2012. Restaging CT 04/12/2012 after 4 cycles of CAPOX/Avastin confirmed improvement in the hepatic metastases. He completed cycle 7 CAPOX/Avastin beginning 06/18/2012. He completed cycle 8 systemic therapy with Xeloda and Avastin (oxaliplatin discontinued) beginning 07/09/2012. He completed cycle 9 beginning 07/30/2012 and cycle 10 beginning on 08/20/2012. Restaging CT evaluation 08/13/2012 with further improvement in liver metastases. 2. Microcytic anemia-resolved. 3. Weight loss secondary to metastatic colon cancer. Improved. He is gaining weight. 4. Status post Port-A-Cath placement 01/10/2012. 5. Hand-foot syndrome secondary to  Xeloda. The Xeloda was dose reduced to 1000 mg twice daily beginning with cycle 4 CAPOX. The hand-foot symptoms have improved. 6. Mild neutropenia 07/09/2012.   Disposition:  He appears stable. Lawrence Villa has decided to discontinue Avastin. I recommend continuing to [facility] since he appears to be tolerating this regimen well and the most recent CTs indicate continued improvement in the metastatic tumor burden. I reviewed the 08/13/2012 and previous CTs with him today. He decided against further Avastin. He will begin another cycle of Xeloda today. Lawrence Villa will return for an office visit and CEA in 3 weeks.   Thornton Papas, MD  09/10/2012  4:18 PM

## 2012-10-01 ENCOUNTER — Ambulatory Visit (HOSPITAL_BASED_OUTPATIENT_CLINIC_OR_DEPARTMENT_OTHER): Payer: Federal, State, Local not specified - PPO

## 2012-10-01 ENCOUNTER — Telehealth: Payer: Self-pay | Admitting: Oncology

## 2012-10-01 ENCOUNTER — Other Ambulatory Visit (HOSPITAL_BASED_OUTPATIENT_CLINIC_OR_DEPARTMENT_OTHER): Payer: Federal, State, Local not specified - PPO | Admitting: Lab

## 2012-10-01 ENCOUNTER — Ambulatory Visit (HOSPITAL_BASED_OUTPATIENT_CLINIC_OR_DEPARTMENT_OTHER): Payer: Federal, State, Local not specified - PPO | Admitting: Oncology

## 2012-10-01 VITALS — BP 133/91 | HR 68 | Temp 96.8°F | Resp 18 | Ht 72.0 in | Wt 205.5 lb

## 2012-10-01 DIAGNOSIS — C187 Malignant neoplasm of sigmoid colon: Secondary | ICD-10-CM

## 2012-10-01 DIAGNOSIS — C189 Malignant neoplasm of colon, unspecified: Secondary | ICD-10-CM

## 2012-10-01 DIAGNOSIS — C787 Secondary malignant neoplasm of liver and intrahepatic bile duct: Secondary | ICD-10-CM

## 2012-10-01 DIAGNOSIS — C19 Malignant neoplasm of rectosigmoid junction: Secondary | ICD-10-CM

## 2012-10-01 LAB — CBC WITH DIFFERENTIAL/PLATELET
Eosinophils Absolute: 0.2 10*3/uL (ref 0.0–0.5)
HCT: 46.2 % (ref 38.4–49.9)
LYMPH%: 37.2 % (ref 14.0–49.0)
MONO#: 0.7 10*3/uL (ref 0.1–0.9)
NEUT#: 3.2 10*3/uL (ref 1.5–6.5)
NEUT%: 48.4 % (ref 39.0–75.0)
Platelets: 202 10*3/uL (ref 140–400)
WBC: 6.6 10*3/uL (ref 4.0–10.3)

## 2012-10-01 LAB — COMPREHENSIVE METABOLIC PANEL (CC13)
AST: 49 U/L — ABNORMAL HIGH (ref 5–34)
Alkaline Phosphatase: 60 U/L (ref 40–150)
BUN: 21.7 mg/dL (ref 7.0–26.0)
Creatinine: 1.1 mg/dL (ref 0.7–1.3)

## 2012-10-01 LAB — UA PROTEIN, DIPSTICK - CHCC: Protein, ur: NEGATIVE mg/dL

## 2012-10-01 MED ORDER — SODIUM CHLORIDE 0.9 % IJ SOLN
10.0000 mL | INTRAMUSCULAR | Status: AC | PRN
  Administered 2012-10-01: 10 mL via INTRAVENOUS
  Filled 2012-10-01: qty 10

## 2012-10-01 MED ORDER — HEPARIN SOD (PORK) LOCK FLUSH 100 UNIT/ML IV SOLN
500.0000 [IU] | Freq: Once | INTRAVENOUS | Status: AC
  Administered 2012-10-01: 500 [IU] via INTRAVENOUS
  Filled 2012-10-01: qty 5

## 2012-10-01 NOTE — Telephone Encounter (Signed)
gv and printed appt sched and avs for pt  °

## 2012-10-01 NOTE — Progress Notes (Unsigned)
No treatment today, only PAC flush.

## 2012-10-01 NOTE — Progress Notes (Signed)
   [facility]    OFFICE PROGRESS NOTE   INTERVAL HISTORY:   She scheduled he completed cycle of Xeloda no mouth ulcers or diarrhea. He continues to have skin thickening and hyperpigmentation at the hands. Cracking persists at the angles of the lips. Good appetite. He continues to work.  Objective:  Vital signs in last 24 hours:  Blood pressure 133/91, pulse 68, temperature 96.8 F (36 C), temperature source Oral, resp. rate 18, height 6' (1.829 m), weight 205 lb 8 oz (93.214 kg).    HEENT: No thrush or ulcers. Mild angular chelitis Lymphatics: No cervical or supraclavicular nodes Resp: Lungs clear bilaterally Cardio:  Regular rate and rhythm GI: No hepatomegaly Vascular: No leg edema  Skin: Hyperpigmentation and skin thickening at the hands. No skin breakdown.   Lab Results:  Lab Results  Component Value Date   WBC 6.6 10/01/2012   HGB 16.1 10/01/2012   HCT 46.2 10/01/2012   MCV 104.4* 10/01/2012   PLT 202 10/01/2012   ANC 3.2    Medications: I have reviewed the patient's current medications.  Assessment/Plan: 1. Metastatic colon cancer status post low anterior resection on 12/22/2011 (T4, N2, M1). Initiation of CAPOX/Avastin on 01/23/2012. The CEA was in normal range on 05/28/2012. Restaging CT 04/12/2012 after 4 cycles of CAPOX/Avastin confirmed improvement in the hepatic metastases. He completed cycle 7 CAPOX/Avastin beginning 06/18/2012. He completed cycle 8 systemic therapy with Xeloda and Avastin (oxaliplatin discontinued) beginning 07/09/2012. He completed cycle 9 beginning 07/30/2012 and cycle 10 beginning on 08/20/2012. Restaging CT evaluation 08/13/2012 with further improvement in liver metastases. He began another cycle of Xeloda on 09/10/2012. He declined further Avastin. 2. Microcytic anemia-resolved. 3. Weight loss secondary to metastatic colon cancer. Improved.  4. Status post Port-A-Cath placement 01/10/2012. 5. Hand-foot syndrome secondary to  Xeloda. The Xeloda was dose reduced to 1000 mg twice daily beginning with cycle 4 CAPOX. The hand-foot symptoms have improved. 6. Mild neutropenia 07/09/2012.   Disposition:  He appears stable. He declines further Avastin. He requests a dose reduction of the Xeloda. I encouraged him to continue Xeloda in an attempt to maintain the clinical remission. He insists on a dose reduce in the Xeloda with the current cycle. He will begin another cycle of Xeloda today at a dose of 1000 mg in the a.m. and 500 mg in the PM. He will then proceed with another cycle of Xeloda on 10/22/2012 at a dose of 500 mg in the a.m. and 500 mg in the PM. Mr. Wilms will return for an office visit and Port-A-Cath flush on 11/12/2012. We will followup on the CEA from today.   Thornton Papas, MD  10/01/2012  4:08 PM

## 2012-10-03 ENCOUNTER — Telehealth: Payer: Self-pay | Admitting: *Deleted

## 2012-10-03 NOTE — Telephone Encounter (Signed)
Notified pt of CEA results.  Pt verbalized understanding and expressed appreciation for call back.

## 2012-10-04 ENCOUNTER — Other Ambulatory Visit: Payer: Self-pay | Admitting: Oncology

## 2012-10-04 ENCOUNTER — Other Ambulatory Visit: Payer: Self-pay | Admitting: *Deleted

## 2012-10-04 MED ORDER — CAPECITABINE 500 MG PO TABS: 500.0000 mg | ORAL_TABLET | Freq: Two times a day (BID) | ORAL | Status: DC

## 2012-10-04 NOTE — Telephone Encounter (Signed)
Change in dose, new Rx already sent.

## 2012-10-25 ENCOUNTER — Telehealth: Payer: Self-pay | Admitting: *Deleted

## 2012-10-25 NOTE — Telephone Encounter (Signed)
Call from sister to inquire if Lawrence Villa was following Dr. Kalman Drape recommendations for treatment? Made her aware that Lawrence Villa has chosen to not take the IV Avastin any longer and is tapering his Xeloda dose down against physician advice. Made her aware of increase in CEA, but still in normal range. Will recheck on 11/12/12. She plans to talk with patient and encourage him to follow the physician's plan of care. She is asking what is is life expectancy with treatment and with the way he has currently been taking his meds or if he stops treatment? She is not sure Lawrence Villa understands how serious his health condition is. Feels that MD will need to be more "direct" with him-more so than he may under usual circumstances.

## 2012-11-12 ENCOUNTER — Ambulatory Visit (HOSPITAL_BASED_OUTPATIENT_CLINIC_OR_DEPARTMENT_OTHER): Payer: Federal, State, Local not specified - PPO | Admitting: Oncology

## 2012-11-12 ENCOUNTER — Telehealth: Payer: Self-pay | Admitting: Oncology

## 2012-11-12 ENCOUNTER — Ambulatory Visit (HOSPITAL_BASED_OUTPATIENT_CLINIC_OR_DEPARTMENT_OTHER): Payer: Federal, State, Local not specified - PPO

## 2012-11-12 ENCOUNTER — Other Ambulatory Visit (HOSPITAL_BASED_OUTPATIENT_CLINIC_OR_DEPARTMENT_OTHER): Payer: Federal, State, Local not specified - PPO | Admitting: Lab

## 2012-11-12 VITALS — BP 127/86 | HR 72 | Temp 96.6°F | Resp 20 | Ht 72.0 in | Wt 211.2 lb

## 2012-11-12 DIAGNOSIS — C787 Secondary malignant neoplasm of liver and intrahepatic bile duct: Secondary | ICD-10-CM

## 2012-11-12 DIAGNOSIS — C19 Malignant neoplasm of rectosigmoid junction: Secondary | ICD-10-CM

## 2012-11-12 DIAGNOSIS — C801 Malignant (primary) neoplasm, unspecified: Secondary | ICD-10-CM

## 2012-11-12 LAB — COMPREHENSIVE METABOLIC PANEL (CC13)
Albumin: 3.8 g/dL (ref 3.5–5.0)
BUN: 21.4 mg/dL (ref 7.0–26.0)
CO2: 23 mEq/L (ref 22–29)
Calcium: 9.3 mg/dL (ref 8.4–10.4)
Glucose: 131 mg/dl (ref 70–140)
Potassium: 4 mEq/L (ref 3.5–5.1)
Sodium: 140 mEq/L (ref 136–145)
Total Protein: 7.3 g/dL (ref 6.4–8.3)

## 2012-11-12 LAB — CBC WITH DIFFERENTIAL/PLATELET
Basophils Absolute: 0.1 10*3/uL (ref 0.0–0.1)
EOS%: 2.3 % (ref 0.0–7.0)
Eosinophils Absolute: 0.2 10*3/uL (ref 0.0–0.5)
HCT: 44.3 % (ref 38.4–49.9)
HGB: 15.5 g/dL (ref 13.0–17.1)
MCH: 35.6 pg — ABNORMAL HIGH (ref 27.2–33.4)
MCV: 101.7 fL — ABNORMAL HIGH (ref 79.3–98.0)
NEUT#: 5.1 10*3/uL (ref 1.5–6.5)
NEUT%: 58.7 % (ref 39.0–75.0)
RDW: 14 % (ref 11.0–14.6)
lymph#: 2.4 10*3/uL (ref 0.9–3.3)

## 2012-11-12 MED ORDER — HEPARIN SOD (PORK) LOCK FLUSH 100 UNIT/ML IV SOLN
500.0000 [IU] | Freq: Once | INTRAVENOUS | Status: AC
  Administered 2012-11-12: 500 [IU] via INTRAVENOUS
  Filled 2012-11-12: qty 5

## 2012-11-12 MED ORDER — SODIUM CHLORIDE 0.9 % IJ SOLN
10.0000 mL | INTRAMUSCULAR | Status: DC | PRN
  Administered 2012-11-12: 10 mL via INTRAVENOUS
  Filled 2012-11-12: qty 10

## 2012-11-12 NOTE — Progress Notes (Signed)
   [facility]    OFFICE PROGRESS NOTE   INTERVAL HISTORY:   He completed a cycle of dose reduced Xeloda beginning on 10/01/2012. He reports mild discomfort at the hands and feet. He has noted discomfort at the "gums ". He recently had a cap placed on a tooth and he is concerned he may have to have a tooth extraction.  He continues to work. He is exercising and participating in yoga. He would like to discontinue Xeloda.  Objective:  Vital signs in last 24 hours:  Blood pressure 127/86, pulse 72, temperature 96.6 F (35.9 C), temperature source Oral, resp. rate 20, height 6' (1.829 m), weight 211 lb 3.2 oz (95.8 kg).    HEENT: No thrush or ulcers. Resp: Lungs clear bilaterally Cardio: Regular rate and rhythm GI: No hepatomegaly, nontender Vascular: No leg edema  Skin: Skin thickening and hyperpigmentation with callus formation at the hands and feet. No ulcers   Portacath/PICC-without erythema  Lab Results:  Lab Results  Component Value Date   WBC 8.6 11/12/2012   HGB 15.5 11/12/2012   HCT 44.3 11/12/2012   MCV 101.7* 11/12/2012   PLT 199 11/12/2012   ANC 5.1  CEA on 10/01/2012-1.4    Medications: I have reviewed the patient's current medications.  Assessment/Plan: 1. Metastatic colon cancer status post low anterior resection on 12/22/2011 (T4, N2, M1). Initiation of CAPOX/Avastin on 01/23/2012. The CEA was in normal range on 05/28/2012. Restaging CT 04/12/2012 after 4 cycles of CAPOX/Avastin confirmed improvement in the hepatic metastases. He completed cycle 7 CAPOX/Avastin beginning 06/18/2012. He completed cycle 8 systemic therapy with Xeloda and Avastin (oxaliplatin discontinued) beginning 07/09/2012. He completed cycle 9 beginning 07/30/2012 and cycle 10 beginning on 08/20/2012. Restaging CT evaluation 08/13/2012 with further improvement in liver metastases. He began another cycle of Xeloda on 09/10/2012. He declined further Avastin. He completed a cycle of  dose reduced Xeloda beginning on 10/01/2012 2. Microcytic anemia-resolved. 3. Weight loss secondary to metastatic colon cancer. Improved.  4. Status post Port-A-Cath placement 01/10/2012. 5. Hand-foot syndrome secondary to Xeloda. The Xeloda was dose reduced to 1000 mg twice daily beginning with cycle 4 CAPOX. The hand-foot symptoms have improved. 6. Mild neutropenia 07/09/2012.    Disposition:  Mr. Czaja appears well. He does not wish to continue Xeloda secondary to the hand/foot symptoms and gums/tooth disease. He will agree to Avastin after he is further evaluated by his dentist next month.  I reviewed the prognosis and treatment options with Mr. Blowe. He understands the metastatic colon cancer is incurable. I explained evidence supporting continuation of systemic therapy in order to maintain a clinical remission and provide a survival benefit. I offered a change to 5-fluorouracil/Avastin with the hope that he would have less hand/foot toxicity with the 5 fluorouracil. He declines this. He stated that he will agree to this single agent Avastin after further dental evaluation. I do not recommend single agent Avastin.  He will remain off of therapy for now. Mr. Capp will return for an office visit and Port-A-Cath flush in 6 weeks. We will followup on the CEA from today. The plan is to schedule a restaging CT evaluation if the CEA rises.   Thornton Papas, MD  11/12/2012  6:11 PM

## 2012-11-12 NOTE — Telephone Encounter (Signed)
gv and printed appt sched and avs for pt  °

## 2012-11-14 ENCOUNTER — Telehealth: Payer: Self-pay | Admitting: *Deleted

## 2012-11-14 NOTE — Telephone Encounter (Signed)
Called pt with CEA results. Stable, per Dr. Sherrill. Pt voiced understanding. 

## 2012-11-14 NOTE — Telephone Encounter (Signed)
Message copied by Caleb Popp on Thu Nov 14, 2012  8:31 AM ------      Message from: Thornton Papas B      Created: Wed Nov 13, 2012  9:04 PM       Please call patient, cea is stable ------

## 2012-12-24 ENCOUNTER — Other Ambulatory Visit (HOSPITAL_BASED_OUTPATIENT_CLINIC_OR_DEPARTMENT_OTHER): Payer: Federal, State, Local not specified - PPO | Admitting: Lab

## 2012-12-24 ENCOUNTER — Ambulatory Visit: Payer: Federal, State, Local not specified - PPO

## 2012-12-24 ENCOUNTER — Ambulatory Visit: Payer: Federal, State, Local not specified - PPO | Admitting: Oncology

## 2012-12-24 ENCOUNTER — Telehealth: Payer: Self-pay | Admitting: Oncology

## 2012-12-24 VITALS — BP 119/79 | HR 68 | Temp 97.5°F

## 2012-12-24 DIAGNOSIS — C19 Malignant neoplasm of rectosigmoid junction: Secondary | ICD-10-CM

## 2012-12-24 DIAGNOSIS — C189 Malignant neoplasm of colon, unspecified: Secondary | ICD-10-CM

## 2012-12-24 DIAGNOSIS — C787 Secondary malignant neoplasm of liver and intrahepatic bile duct: Secondary | ICD-10-CM

## 2012-12-24 MED ORDER — SODIUM CHLORIDE 0.9 % IJ SOLN
10.0000 mL | INTRAMUSCULAR | Status: DC | PRN
  Administered 2012-12-24: 10 mL via INTRAVENOUS
  Filled 2012-12-24: qty 10

## 2012-12-24 MED ORDER — HEPARIN SOD (PORK) LOCK FLUSH 100 UNIT/ML IV SOLN
500.0000 [IU] | Freq: Once | INTRAVENOUS | Status: AC
  Administered 2012-12-24: 500 [IU] via INTRAVENOUS
  Filled 2012-12-24: qty 5

## 2012-12-24 NOTE — Telephone Encounter (Signed)
Gave pt appt for 9/30 per pt, he is late today for his appt lab and MD

## 2012-12-25 ENCOUNTER — Telehealth: Payer: Self-pay | Admitting: *Deleted

## 2012-12-25 LAB — CEA: CEA: 2.8 ng/mL (ref 0.0–5.0)

## 2012-12-25 NOTE — Telephone Encounter (Signed)
Received message from pt requesting CEA results.  Called and left message for pt to contact office for results and for re-scheduled appt date/time.

## 2012-12-26 ENCOUNTER — Telehealth: Payer: Self-pay | Admitting: *Deleted

## 2012-12-26 ENCOUNTER — Telehealth: Payer: Self-pay | Admitting: Oncology

## 2012-12-26 NOTE — Telephone Encounter (Signed)
Left VM requesting CEA results. OK to leave message if he does not answer phone.

## 2012-12-26 NOTE — Telephone Encounter (Signed)
pt aware of appt for lab August 2014

## 2012-12-26 NOTE — Telephone Encounter (Signed)
Left VM for patient that CEA is higher at 2.8, which is expected since he has not been treated recently. Gave appointment with Dr. Truett Perna for 8/19 at 12 noon. Requested he call back to confirm he will come.

## 2012-12-30 ENCOUNTER — Telehealth: Payer: Self-pay | Admitting: Oncology

## 2012-12-30 NOTE — Telephone Encounter (Signed)
called pt and left message for tomorrow for 8/19 Md only

## 2012-12-31 ENCOUNTER — Telehealth: Payer: Self-pay | Admitting: Oncology

## 2012-12-31 ENCOUNTER — Ambulatory Visit (HOSPITAL_BASED_OUTPATIENT_CLINIC_OR_DEPARTMENT_OTHER): Payer: Federal, State, Local not specified - PPO | Admitting: Oncology

## 2012-12-31 ENCOUNTER — Ambulatory Visit (HOSPITAL_BASED_OUTPATIENT_CLINIC_OR_DEPARTMENT_OTHER): Payer: Federal, State, Local not specified - PPO

## 2012-12-31 ENCOUNTER — Telehealth: Payer: Self-pay | Admitting: *Deleted

## 2012-12-31 VITALS — BP 110/66 | HR 66 | Temp 97.0°F | Resp 18 | Ht 72.0 in | Wt 211.8 lb

## 2012-12-31 DIAGNOSIS — C19 Malignant neoplasm of rectosigmoid junction: Secondary | ICD-10-CM

## 2012-12-31 DIAGNOSIS — Z5112 Encounter for antineoplastic immunotherapy: Secondary | ICD-10-CM

## 2012-12-31 DIAGNOSIS — C787 Secondary malignant neoplasm of liver and intrahepatic bile duct: Secondary | ICD-10-CM

## 2012-12-31 DIAGNOSIS — C187 Malignant neoplasm of sigmoid colon: Secondary | ICD-10-CM

## 2012-12-31 LAB — POCT URINE QUALITATIVE DIPSTICK PROTEIN

## 2012-12-31 MED ORDER — SODIUM CHLORIDE 0.9 % IJ SOLN
10.0000 mL | INTRAMUSCULAR | Status: DC | PRN
  Filled 2012-12-31: qty 10

## 2012-12-31 MED ORDER — HEPARIN SOD (PORK) LOCK FLUSH 100 UNIT/ML IV SOLN
500.0000 [IU] | Freq: Once | INTRAVENOUS | Status: DC | PRN
  Filled 2012-12-31: qty 5

## 2012-12-31 MED ORDER — SODIUM CHLORIDE 0.9 % IV SOLN
675.0000 mg | Freq: Once | INTRAVENOUS | Status: AC
  Administered 2012-12-31: 675 mg via INTRAVENOUS
  Filled 2012-12-31: qty 27

## 2012-12-31 NOTE — Progress Notes (Signed)
   [facility]    OFFICE PROGRESS NOTE   INTERVAL HISTORY:   He returns for scheduled visit after missing an appointment last week. He feels well. The hands and feet have returned to normal. No neuropathy symptoms. Good appetite and energy level. Mild nausea for the past several days. He has decided to resume systemic therapy.  Objective:  Vital signs in last 24 hours:  Blood pressure 110/66, pulse 66, temperature 97 F (36.1 C), temperature source Oral, resp. rate 18, height 6' (1.829 m), weight 211 lb 12.8 oz (96.072 kg).    HEENT: Neck without mass Lymphatics: No cervical, supraclavicular, or axillary nodes Resp: Lungs clear bilaterally Cardio: Regular rate and rhythm GI: No hepatosplenomegaly, nontender, no mass Vascular: No leg edema  Skin: Callous formation over the soles without skin breakdown. The hands appear unremarkable.   Portacath/PICC-without erythema  Lab Results:  Lab Results  Component Value Date   WBC 8.6 11/12/2012   HGB 15.5 11/12/2012   HCT 44.3 11/12/2012   MCV 101.7* 11/12/2012   PLT 199 11/12/2012   ANC 5.1    Medications: I have reviewed the patient's current medications.  Assessment/Plan: 1. Metastatic colon cancer status post low anterior resection on 12/22/2011 (T4, N2, M1). Initiation of CAPOX/Avastin on 01/23/2012. The CEA was in normal range on 05/28/2012. Restaging CT 04/12/2012 after 4 cycles of CAPOX/Avastin confirmed improvement in the hepatic metastases. He completed cycle 7 CAPOX/Avastin beginning 06/18/2012. He completed cycle 8 systemic therapy with Xeloda and Avastin (oxaliplatin discontinued) beginning 07/09/2012. He completed cycle 9 beginning 07/30/2012 and cycle 10 beginning on 08/20/2012. Restaging CT evaluation 08/13/2012 with further improvement in liver metastases. He began another cycle of Xeloda on 09/10/2012. He declined further Avastin. He completed a cycle of dose reduced Xeloda beginning on 10/01/2012 and was then  maintained off of systemic therapy. 2. Microcytic anemia-resolved. 3. History of Weight loss secondary to metastatic colon cancer.  4. Status post Port-A-Cath placement 01/10/2012. 5. Hand-foot syndrome secondary to Xeloda. The Xeloda was dose reduced to 1000 mg twice daily beginning with cycle 4 CAPOX. The hand-foot symptoms have resolved 6. Mild neutropenia 07/09/2012.    Disposition:  He appears well. He has been maintained off of systemic therapy for the past few months. I discussed the rationale for continuing systemic therapy with Mr. Mohl. He agrees to resume Dealer. He understands the potential for developing hand/foot syndrome. The plan is to begin [place] today. He will return for an office visit and chemotherapy in 3 weeks.  We will follow the CEA and his clinical status closely. We will plan a restaging CT evaluation within the next few months.   Thornton Papas, MD  12/31/2012  1:09 PM

## 2012-12-31 NOTE — Telephone Encounter (Signed)
Per staff message and POF I have scheduled appts.  JMW  

## 2012-12-31 NOTE — Patient Instructions (Addendum)
[facility] Discharge Instructions for Patients Receiving Chemotherapy  Today you received the following chemotherapy agents Avastin.  To help prevent nausea and vomiting after your treatment, we encourage you to take your nausea medication.   If you develop nausea and vomiting that is not controlled by your nausea medication, call the clinic.   BELOW ARE SYMPTOMS THAT SHOULD BE REPORTED IMMEDIATELY:  *FEVER GREATER THAN 100.5 F  *CHILLS WITH OR WITHOUT FEVER  NAUSEA AND VOMITING THAT IS NOT CONTROLLED WITH YOUR NAUSEA MEDICATION  *UNUSUAL SHORTNESS OF BREATH  *UNUSUAL BRUISING OR BLEEDING  TENDERNESS IN MOUTH AND THROAT WITH OR WITHOUT PRESENCE OF ULCERS  *URINARY PROBLEMS  *BOWEL PROBLEMS  UNUSUAL RASH Items with * indicate a potential emergency and should be followed up as soon as possible.  Feel free to call the clinic you have any questions or concerns. The clinic phone number is [phone].    

## 2012-12-31 NOTE — Telephone Encounter (Signed)
Gave pt appt for lab, ML  and MD for September  2014 , emailed Marcelino Duster regarding chemo

## 2013-01-01 ENCOUNTER — Telehealth: Payer: Self-pay | Admitting: Oncology

## 2013-01-01 NOTE — Telephone Encounter (Signed)
Gave pt appt for lab and Md for February , gave pt oral contrast

## 2013-01-07 ENCOUNTER — Encounter: Payer: Self-pay | Admitting: Medical Oncology

## 2013-01-07 ENCOUNTER — Other Ambulatory Visit: Payer: Self-pay | Admitting: Medical Oncology

## 2013-01-07 MED ORDER — CAPECITABINE 500 MG PO TABS: ORAL_TABLET | ORAL | Status: DC

## 2013-01-10 ENCOUNTER — Telehealth: Payer: Self-pay | Admitting: *Deleted

## 2013-01-10 NOTE — Telephone Encounter (Signed)
Spoke with patient & he reports he did not finish his last cycle of Xeloda due to running out about 4 days ago. He is aware of the 1000 mg bid dose, but thinks with next cycle it will need to be reduced, "but I'll talk with Dr. Truett Perna about that". He will call [facility] for delivery. Since drug will not come in until Sept. 3rd, he will hold it and wait to begin with the next cycle due on 01/21/13. Confirmed that this is appropriate thing to do.

## 2013-01-10 NOTE — Telephone Encounter (Signed)
Pharmacy call to clarify quantity of Xeloda needed for his refill. Patient told them he needed #28, but script was sent in for #56. Reviewed directions as 1000 mg bid X 14 days = #56. Representative requested nurse call patient and let him know. They will call him to arrange shipment.

## 2013-01-19 ENCOUNTER — Other Ambulatory Visit: Payer: Self-pay | Admitting: Oncology

## 2013-01-21 ENCOUNTER — Other Ambulatory Visit (HOSPITAL_BASED_OUTPATIENT_CLINIC_OR_DEPARTMENT_OTHER): Payer: Federal, State, Local not specified - PPO | Admitting: Lab

## 2013-01-21 ENCOUNTER — Ambulatory Visit (HOSPITAL_BASED_OUTPATIENT_CLINIC_OR_DEPARTMENT_OTHER): Payer: Federal, State, Local not specified - PPO | Admitting: Nurse Practitioner

## 2013-01-21 ENCOUNTER — Ambulatory Visit (HOSPITAL_BASED_OUTPATIENT_CLINIC_OR_DEPARTMENT_OTHER): Payer: Federal, State, Local not specified - PPO

## 2013-01-21 ENCOUNTER — Telehealth: Payer: Self-pay | Admitting: *Deleted

## 2013-01-21 ENCOUNTER — Telehealth: Payer: Self-pay | Admitting: Oncology

## 2013-01-21 VITALS — BP 122/83 | HR 52 | Resp 19 | Ht 72.0 in | Wt 210.9 lb

## 2013-01-21 DIAGNOSIS — C19 Malignant neoplasm of rectosigmoid junction: Secondary | ICD-10-CM

## 2013-01-21 DIAGNOSIS — C187 Malignant neoplasm of sigmoid colon: Secondary | ICD-10-CM

## 2013-01-21 DIAGNOSIS — C787 Secondary malignant neoplasm of liver and intrahepatic bile duct: Secondary | ICD-10-CM

## 2013-01-21 DIAGNOSIS — Z5112 Encounter for antineoplastic immunotherapy: Secondary | ICD-10-CM

## 2013-01-21 LAB — CBC WITH DIFFERENTIAL/PLATELET
BASO%: 0.9 % (ref 0.0–2.0)
EOS%: 3.1 % (ref 0.0–7.0)
MCH: 33 pg (ref 27.2–33.4)
MCHC: 34.6 g/dL (ref 32.0–36.0)
RDW: 12.7 % (ref 11.0–14.6)
WBC: 6.4 10*3/uL (ref 4.0–10.3)
lymph#: 2.2 10*3/uL (ref 0.9–3.3)

## 2013-01-21 LAB — COMPREHENSIVE METABOLIC PANEL (CC13)
ALT: 32 U/L (ref 0–55)
AST: 31 U/L (ref 5–34)
Albumin: 3.6 g/dL (ref 3.5–5.0)
Calcium: 9.6 mg/dL (ref 8.4–10.4)
Chloride: 104 mEq/L (ref 98–109)
Potassium: 4.7 mEq/L (ref 3.5–5.1)
Sodium: 139 mEq/L (ref 136–145)
Total Protein: 7 g/dL (ref 6.4–8.3)

## 2013-01-21 MED ORDER — HEPARIN SOD (PORK) LOCK FLUSH 100 UNIT/ML IV SOLN
500.0000 [IU] | Freq: Once | INTRAVENOUS | Status: AC | PRN
  Administered 2013-01-21: 500 [IU]
  Filled 2013-01-21: qty 5

## 2013-01-21 MED ORDER — SODIUM CHLORIDE 0.9 % IJ SOLN
10.0000 mL | INTRAMUSCULAR | Status: DC | PRN
  Administered 2013-01-21: 10 mL
  Filled 2013-01-21: qty 10

## 2013-01-21 MED ORDER — SODIUM CHLORIDE 0.9 % IV SOLN
Freq: Once | INTRAVENOUS | Status: AC
  Administered 2013-01-21: 10:00:00 via INTRAVENOUS

## 2013-01-21 MED ORDER — SODIUM CHLORIDE 0.9 % IV SOLN
7.5000 mg/kg | Freq: Once | INTRAVENOUS | Status: AC
  Administered 2013-01-21: 675 mg via INTRAVENOUS
  Filled 2013-01-21: qty 27

## 2013-01-21 NOTE — Telephone Encounter (Signed)
Gave pt appt for lab and MD , emailed Marcelino Duster regarding chemo on October after MD visitGave pt appt for lab and MD , emailed Marcelino Duster regarding chemo on October after MD visit

## 2013-01-21 NOTE — Patient Instructions (Addendum)
[facility] Discharge Instructions for Patients Receiving Chemotherapy  Today you received the following chemotherapy agents: avastin   To help prevent nausea and vomiting after your treatment, we encourage you to take your nausea medication.  Take it as often as prescribed.     If you develop nausea and vomiting that is not controlled by your nausea medication, call the clinic. If it is after clinic hours your family physician or the after hours number for the clinic or go to the Emergency Department.   BELOW ARE SYMPTOMS THAT SHOULD BE REPORTED IMMEDIATELY:  *FEVER GREATER THAN 100.5 F  *CHILLS WITH OR WITHOUT FEVER  NAUSEA AND VOMITING THAT IS NOT CONTROLLED WITH YOUR NAUSEA MEDICATION  *UNUSUAL SHORTNESS OF BREATH  *UNUSUAL BRUISING OR BLEEDING  TENDERNESS IN MOUTH AND THROAT WITH OR WITHOUT PRESENCE OF ULCERS  *URINARY PROBLEMS  *BOWEL PROBLEMS  UNUSUAL RASH Items with * indicate a potential emergency and should be followed up as soon as possible.  Feel free to call the clinic you have any questions or concerns. The clinic phone number is [phone].   I have been informed and understand all the instructions given to me. I know to contact the clinic, my physician, or go to the Emergency Department if any problems should occur. I do not have any questions at this time, but understand that I may call the clinic during office hours   should I have any questions or need assistance in obtaining follow up care.    __________________________________________  _____________  __________ Signature of Patient or Authorized Representative            Date                   Time    __________________________________________ Nurse's Signature    

## 2013-01-21 NOTE — Progress Notes (Signed)
OFFICE PROGRESS NOTE  Interval history:  Lawrence Villa returns as scheduled. Treatment was resumed with Xeloda/Avastin on 12/31/2012. He completed 3 days of Xeloda from a previous supply. The new prescription did not arrive in time to complete the cycle. He denies nausea/vomiting. No mouth sores. No diarrhea. He denies bleeding. No shortness of breath or chest pain. No leg swelling or calf pain. He has mild intermittent discomfort at the right side. He has a good appetite.   Objective: Blood pressure 122/83, pulse 52, resp. rate 19, height 6' (1.829 m), weight 210 lb 14.4 oz (95.664 kg).  Oropharynx is without thrush or ulceration. Lungs are clear. Regular cardiac rhythm. Port-A-Cath site without erythema. Abdomen is soft and nontender. No hepatomegaly. Extremities are without edema. Calves are nontender.  Lab Results: Lab Results  Component Value Date   WBC 6.4 01/21/2013   HGB 15.7 01/21/2013   HCT 45.2 01/21/2013   MCV 95.2 01/21/2013   PLT 203 01/21/2013    Chemistry:    Chemistry      Component Value Date/Time   NA 139 01/21/2013 0835   NA 140 03/05/2012 0924   K 4.7 01/21/2013 0835   K 4.2 03/05/2012 0924   CL 107 10/01/2012 1525   CL 106 03/05/2012 0924   CO2 28 01/21/2013 0835   CO2 25 03/05/2012 0924   BUN 18.2 01/21/2013 0835   BUN 19 03/05/2012 0924   CREATININE 0.8 01/21/2013 0835   CREATININE 0.81 03/05/2012 0924      Component Value Date/Time   CALCIUM 9.6 01/21/2013 0835   CALCIUM 9.4 03/05/2012 0924   ALKPHOS 58 01/21/2013 0835   ALKPHOS 57 03/05/2012 0924   AST 31 01/21/2013 0835   AST 30 03/05/2012 0924   ALT 32 01/21/2013 0835   ALT 24 03/05/2012 0924   BILITOT 1.49* 01/21/2013 0835   BILITOT 1.2 03/05/2012 0924       Studies/Results: No results found.  Medications: I have reviewed the patient's current medications.  Assessment/Plan:  1. Metastatic colon cancer status post low anterior resection on 12/22/2011 (T4, N2, M1). Initiation of CAPOX/Avastin on 01/23/2012. The CEA  was in normal range on 05/28/2012. Restaging CT 04/12/2012 after 4 cycles of CAPOX/Avastin confirmed improvement in the hepatic metastases. He completed cycle 7 CAPOX/Avastin beginning 06/18/2012. He completed cycle 8 systemic therapy with Xeloda and Avastin (oxaliplatin discontinued) beginning 07/09/2012. He completed cycle 9 beginning 07/30/2012 and cycle 10 beginning on 08/20/2012. Restaging CT evaluation 08/13/2012 with further improvement in liver metastases. He began another cycle of Xeloda on 09/10/2012. He declined further Avastin. He completed a cycle of dose reduced Xeloda beginning on 10/01/2012 and was then maintained off of systemic therapy. Systemic therapy resumed with Xeloda/Avastin 12/31/2012 (completed 3 days of Xeloda with cycle 1). 2. Microcytic anemia-resolved. 3. History of Weight loss secondary to metastatic colon cancer.  4. Status post Port-A-Cath placement 01/10/2012. 5. Hand-foot syndrome secondary to Xeloda. The Xeloda was dose reduced to 1000 mg twice daily beginning with cycle 4 CAPOX. The hand-foot symptoms have resolved 6. Mild neutropenia 07/09/2012.   Disposition-Lawrence Villa appears stable. Plan to proceed with cycle 2 Xeloda/Avastin today as scheduled. He will return for a followup visit and cycle 3 in 3 weeks with a followup CEA. He will contact the office in the interim with any problems.  Plan reviewed with Dr. Truett Perna.  Lawrence Villa ANP/GNP-BC

## 2013-01-21 NOTE — Telephone Encounter (Signed)
Per staff message and POF I have scheduled appts.  JMW  

## 2013-01-23 ENCOUNTER — Telehealth: Payer: Self-pay

## 2013-01-23 NOTE — Telephone Encounter (Signed)
Talked to pt and he is aware of appts for September and October

## 2013-02-09 ENCOUNTER — Other Ambulatory Visit: Payer: Self-pay | Admitting: Oncology

## 2013-02-11 ENCOUNTER — Ambulatory Visit (HOSPITAL_BASED_OUTPATIENT_CLINIC_OR_DEPARTMENT_OTHER): Payer: Federal, State, Local not specified - PPO

## 2013-02-11 ENCOUNTER — Other Ambulatory Visit (HOSPITAL_BASED_OUTPATIENT_CLINIC_OR_DEPARTMENT_OTHER): Payer: Federal, State, Local not specified - PPO | Admitting: Lab

## 2013-02-11 ENCOUNTER — Ambulatory Visit (HOSPITAL_BASED_OUTPATIENT_CLINIC_OR_DEPARTMENT_OTHER): Payer: Federal, State, Local not specified - PPO | Admitting: Oncology

## 2013-02-11 VITALS — BP 116/79 | HR 61 | Temp 97.0°F | Resp 20 | Ht 72.0 in | Wt 212.3 lb

## 2013-02-11 DIAGNOSIS — C787 Secondary malignant neoplasm of liver and intrahepatic bile duct: Secondary | ICD-10-CM

## 2013-02-11 DIAGNOSIS — C187 Malignant neoplasm of sigmoid colon: Secondary | ICD-10-CM

## 2013-02-11 DIAGNOSIS — C19 Malignant neoplasm of rectosigmoid junction: Secondary | ICD-10-CM

## 2013-02-11 DIAGNOSIS — L270 Generalized skin eruption due to drugs and medicaments taken internally: Secondary | ICD-10-CM

## 2013-02-11 DIAGNOSIS — Z5112 Encounter for antineoplastic immunotherapy: Secondary | ICD-10-CM

## 2013-02-11 LAB — CBC WITH DIFFERENTIAL/PLATELET
BASO%: 0.6 % (ref 0.0–2.0)
Basophils Absolute: 0 10*3/uL (ref 0.0–0.1)
EOS%: 2.6 % (ref 0.0–7.0)
HGB: 15.9 g/dL (ref 13.0–17.1)
MCH: 33.4 pg (ref 27.2–33.4)
MCHC: 35.5 g/dL (ref 32.0–36.0)
MCV: 94.1 fL (ref 79.3–98.0)
MONO%: 8.6 % (ref 0.0–14.0)
RBC: 4.76 10*6/uL (ref 4.20–5.82)
RDW: 14.1 % (ref 11.0–14.6)
lymph#: 2.4 10*3/uL (ref 0.9–3.3)
nRBC: 0 % (ref 0–0)

## 2013-02-11 LAB — COMPREHENSIVE METABOLIC PANEL (CC13)
ALT: 25 U/L (ref 0–55)
AST: 32 U/L (ref 5–34)
Albumin: 3.6 g/dL (ref 3.5–5.0)
Alkaline Phosphatase: 51 U/L (ref 40–150)
Potassium: 4.3 mEq/L (ref 3.5–5.1)
Sodium: 139 mEq/L (ref 136–145)
Total Bilirubin: 1.09 mg/dL (ref 0.20–1.20)
Total Protein: 7.1 g/dL (ref 6.4–8.3)

## 2013-02-11 MED ORDER — HEPARIN SOD (PORK) LOCK FLUSH 100 UNIT/ML IV SOLN
500.0000 [IU] | Freq: Once | INTRAVENOUS | Status: AC | PRN
  Administered 2013-02-11: 500 [IU]
  Filled 2013-02-11: qty 5

## 2013-02-11 MED ORDER — SODIUM CHLORIDE 0.9 % IV SOLN
7.5000 mg/kg | Freq: Once | INTRAVENOUS | Status: AC
  Administered 2013-02-11: 675 mg via INTRAVENOUS
  Filled 2013-02-11: qty 27

## 2013-02-11 MED ORDER — SODIUM CHLORIDE 0.9 % IV SOLN
Freq: Once | INTRAVENOUS | Status: AC
  Administered 2013-02-11: 17:00:00 via INTRAVENOUS

## 2013-02-11 MED ORDER — SODIUM CHLORIDE 0.9 % IJ SOLN
10.0000 mL | INTRAMUSCULAR | Status: DC | PRN
  Administered 2013-02-11: 10 mL
  Filled 2013-02-11: qty 10

## 2013-02-11 NOTE — Patient Instructions (Addendum)
[facility] Discharge Instructions for Patients Receiving Chemotherapy  Today you received the following chemotherapy agents Avastin  To help prevent nausea and vomiting after your treatment, we encourage you to take your nausea medication as needed   If you develop nausea and vomiting that is not controlled by your nausea medication, call the clinic.   BELOW ARE SYMPTOMS THAT SHOULD BE REPORTED IMMEDIATELY:  *FEVER GREATER THAN 100.5 F  *CHILLS WITH OR WITHOUT FEVER  NAUSEA AND VOMITING THAT IS NOT CONTROLLED WITH YOUR NAUSEA MEDICATION  *UNUSUAL SHORTNESS OF BREATH  *UNUSUAL BRUISING OR BLEEDING  TENDERNESS IN MOUTH AND THROAT WITH OR WITHOUT PRESENCE OF ULCERS  *URINARY PROBLEMS  *BOWEL PROBLEMS  UNUSUAL RASH Items with * indicate a potential emergency and should be followed up as soon as possible.  Feel free to call the clinic you have any questions or concerns. The clinic phone number is [phone].    

## 2013-02-11 NOTE — Progress Notes (Signed)
   [facility]    OFFICE PROGRESS NOTE   INTERVAL HISTORY:   He returns as scheduled. He completed another cycle of Xeloda/Avastin beginning on 01/21/2013. No mouth ulcer, diarrhea, nausea, bleeding, or symptom of thrombosis. He feels well. He continues to work. Intermittent discomfort in the right upper abdomen. Skin thickening at the palms and soles without pain. Angular cheilitis at the left lips.  Objective:  Vital signs in last 24 hours:  Blood pressure 116/79, pulse 61, temperature 97 F (36.1 C), temperature source Oral, resp. rate 20, height 6' (1.829 m), weight 212 lb 4.8 oz (96.299 kg).    HEENT: No thrush or ulcers, mild left angular cheilitis Resp: Lungs clear bilaterally Cardio: Regular rate and rhythm GI: No hepatomegaly, nontender Vascular: No leg edema  Skin: Mild erythema at the palms, skin thickening and dry desquamation at the soles. No ulceration.   Portacath/PICC-without erythema  Lab Results:  Lab Results  Component Value Date   WBC 7.0 02/11/2013   HGB 15.9 02/11/2013   HCT 44.8 02/11/2013   MCV 94.1 02/11/2013   PLT 238 02/11/2013   ANC 3.8  CEA 4.4 on 01/21/2013  Medications: I have reviewed the patient's current medications.  Assessment/Plan: 1. Metastatic colon cancer status post low anterior resection on 12/22/2011 (T4, N2, M1). Initiation of CAPOX/Avastin on 01/23/2012. The CEA was in normal range on 05/28/2012. Restaging CT 04/12/2012 after 4 cycles of CAPOX/Avastin confirmed improvement in the hepatic metastases. He completed cycle 7 CAPOX/Avastin beginning 06/18/2012. He completed cycle 8 systemic therapy with Xeloda and Avastin (oxaliplatin discontinued) beginning 07/09/2012. He completed cycle 9 beginning 07/30/2012 and cycle 10 beginning on 08/20/2012. Restaging CT evaluation 08/13/2012 with further improvement in liver metastases. He began another cycle of Xeloda on 09/10/2012. He declined further Avastin. He completed a cycle  of dose reduced Xeloda beginning on 10/01/2012 and was then maintained off of systemic therapy. Systemic therapy resumed with Xeloda/Avastin 12/31/2012 (completed 3 days of Xeloda with cycle 1). 2. Microcytic anemia-resolved. 3. History of Weight loss secondary to metastatic colon cancer.  4. Status post Port-A-Cath placement 01/10/2012. 5. Hand-foot syndrome secondary to Xeloda. The Xeloda was dose reduced to 1000 mg twice daily beginning with cycle 4 CAPOX. The hand-foot symptoms have improved 6. Mild neutropenia 07/09/2012.   Disposition:  Lawrence Villa is tolerating the [facility] well. The plan is to begin a third cycle today. We will followup on the CEA from today. He will return for an office visit and chemotherapy in 3 weeks. We will plan for a restaging CT evaluation after 4-5 cycles of Xeloda/Avastin.   Thornton Papas, MD  02/11/2013  5:26 PM

## 2013-02-12 ENCOUNTER — Telehealth: Payer: Self-pay | Admitting: *Deleted

## 2013-02-12 ENCOUNTER — Telehealth: Payer: Self-pay | Admitting: Oncology

## 2013-02-12 LAB — CEA: CEA: 4 ng/mL (ref 0.0–5.0)

## 2013-02-12 NOTE — Telephone Encounter (Signed)
Per staff message and POF I have scheduled appts.  JMW  

## 2013-02-12 NOTE — Telephone Encounter (Signed)
s.w. pt and advised on Oct and NOV appts...pt ok and aware

## 2013-02-14 ENCOUNTER — Telehealth: Payer: Self-pay | Admitting: *Deleted

## 2013-02-14 NOTE — Telephone Encounter (Signed)
Notified of CEA result. 

## 2013-02-14 NOTE — Telephone Encounter (Signed)
Message copied by Wandalee Ferdinand on Fri Feb 14, 2013 10:15 AM ------      Message from: Thornton Papas B      Created: Wed Feb 12, 2013  9:37 PM       Please call patient, cea is normal ------

## 2013-02-24 ENCOUNTER — Other Ambulatory Visit: Payer: Self-pay | Admitting: Oncology

## 2013-02-24 ENCOUNTER — Other Ambulatory Visit: Payer: Self-pay | Admitting: *Deleted

## 2013-02-24 DIAGNOSIS — C787 Secondary malignant neoplasm of liver and intrahepatic bile duct: Secondary | ICD-10-CM

## 2013-02-24 DIAGNOSIS — C801 Malignant (primary) neoplasm, unspecified: Secondary | ICD-10-CM

## 2013-02-24 NOTE — Telephone Encounter (Signed)
THIS REFILL REQUEST FOR CAPECITABINE WAS GIVEN TO DR.SHERRILL'S NURSE, SUSAN COWARD,RN. 

## 2013-02-24 NOTE — Telephone Encounter (Signed)
REFILL FOR CAPECITABINE WAS DONE EARLIER TODAY ELECTRONICALLY.

## 2013-02-28 IMAGING — CR DG CHEST 1V PORT
1 series · 1 of 1 positions shown · non-contrast
Comparison: CT thorax 12/18/2011

CLINICAL DATA: Pneumothorax following port placement

PORTABLE CHEST - 1 VIEW

[AP]
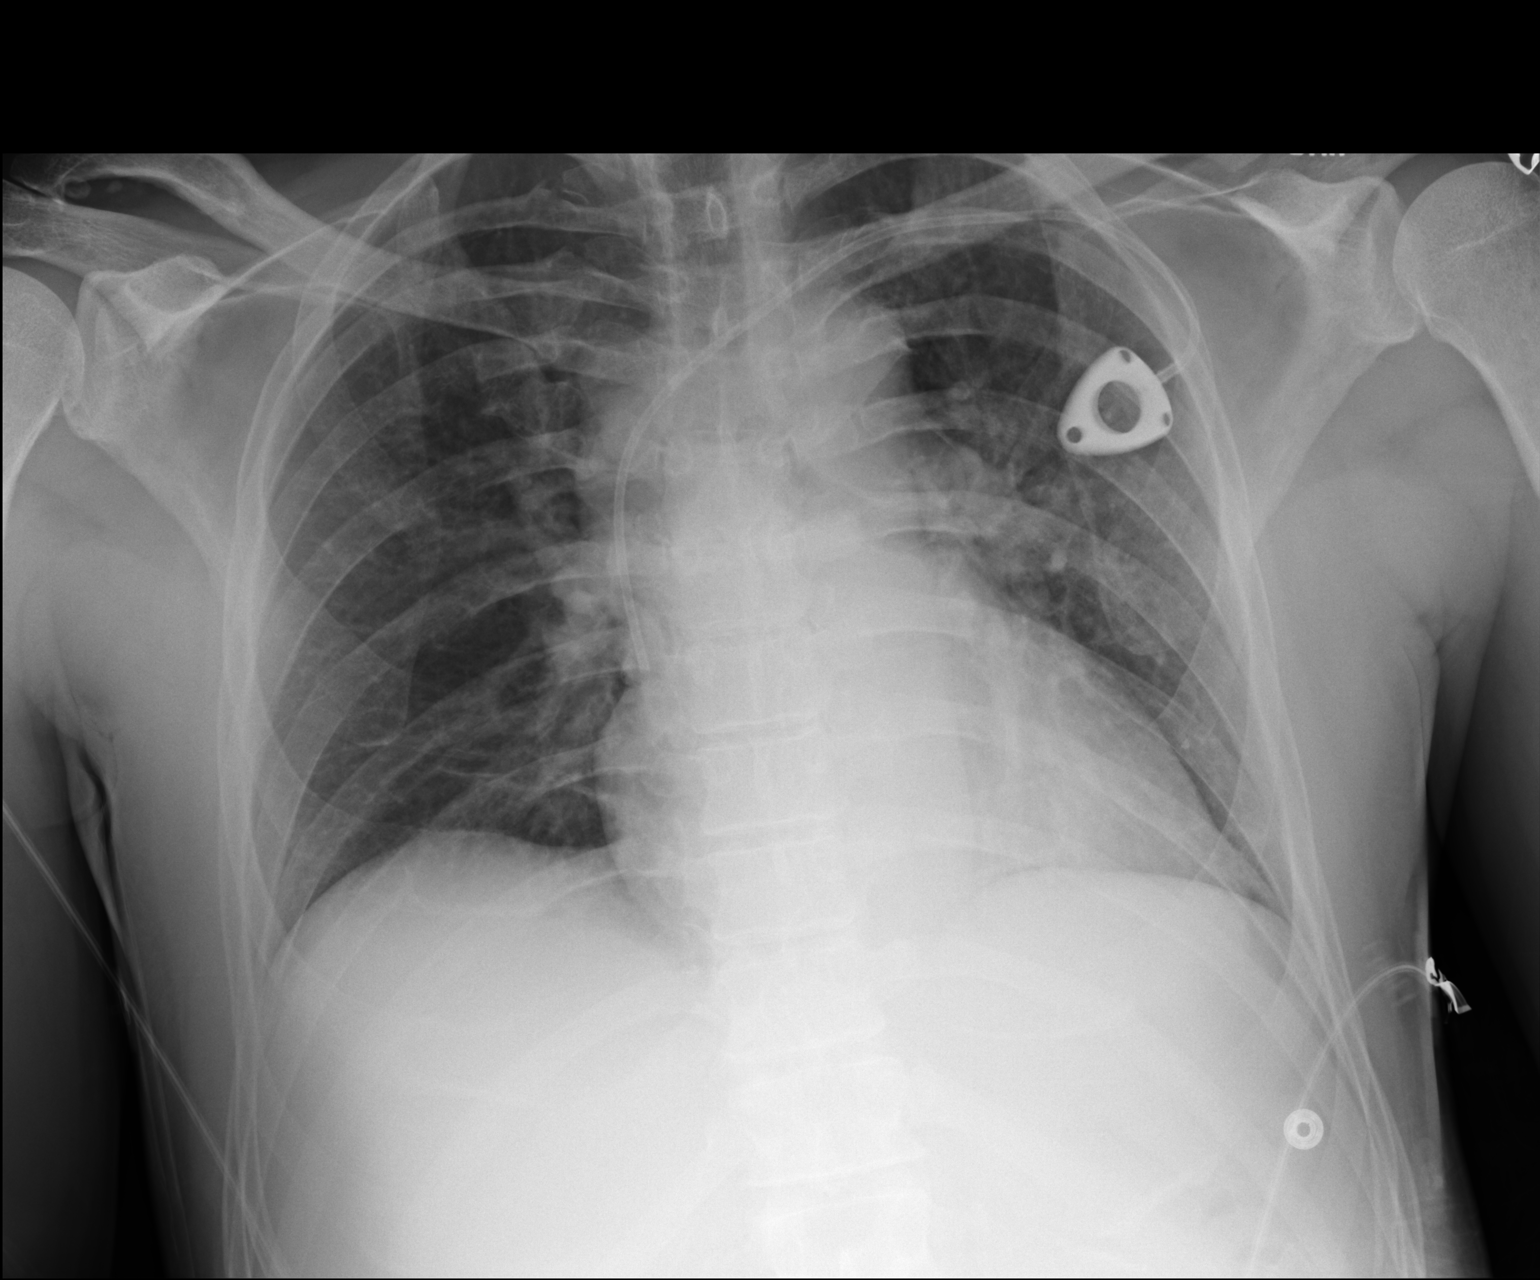

[1 of 1 positions shown; findings below may reference images not displayed]

FINDINGS: Interval placement of a left-sided port with tip in the
distal SVC.  No pneumothorax.  Normal cardiac silhouette.  Lungs
are clear.
IMPRESSION: Interval placement left port without pneumothorax.

## 2013-03-04 ENCOUNTER — Ambulatory Visit (HOSPITAL_BASED_OUTPATIENT_CLINIC_OR_DEPARTMENT_OTHER): Payer: Federal, State, Local not specified - PPO | Admitting: Oncology

## 2013-03-04 ENCOUNTER — Telehealth: Payer: Self-pay | Admitting: Oncology

## 2013-03-04 ENCOUNTER — Other Ambulatory Visit (HOSPITAL_BASED_OUTPATIENT_CLINIC_OR_DEPARTMENT_OTHER): Payer: Federal, State, Local not specified - PPO | Admitting: Lab

## 2013-03-04 ENCOUNTER — Telehealth: Payer: Self-pay | Admitting: *Deleted

## 2013-03-04 ENCOUNTER — Ambulatory Visit (HOSPITAL_BASED_OUTPATIENT_CLINIC_OR_DEPARTMENT_OTHER): Payer: Federal, State, Local not specified - PPO

## 2013-03-04 VITALS — BP 136/88 | HR 62 | Temp 98.7°F | Resp 18 | Ht 72.0 in | Wt 209.0 lb

## 2013-03-04 DIAGNOSIS — C787 Secondary malignant neoplasm of liver and intrahepatic bile duct: Secondary | ICD-10-CM

## 2013-03-04 DIAGNOSIS — Z5112 Encounter for antineoplastic immunotherapy: Secondary | ICD-10-CM

## 2013-03-04 DIAGNOSIS — C187 Malignant neoplasm of sigmoid colon: Secondary | ICD-10-CM

## 2013-03-04 DIAGNOSIS — C19 Malignant neoplasm of rectosigmoid junction: Secondary | ICD-10-CM

## 2013-03-04 DIAGNOSIS — L270 Generalized skin eruption due to drugs and medicaments taken internally: Secondary | ICD-10-CM

## 2013-03-04 LAB — COMPREHENSIVE METABOLIC PANEL (CC13)
ALT: 22 U/L (ref 0–55)
AST: 32 U/L (ref 5–34)
Albumin: 3.7 g/dL (ref 3.5–5.0)
Alkaline Phosphatase: 49 U/L (ref 40–150)
BUN: 21.7 mg/dL (ref 7.0–26.0)
Calcium: 9.4 mg/dL (ref 8.4–10.4)
Chloride: 107 mEq/L (ref 98–109)
Creatinine: 0.9 mg/dL (ref 0.7–1.3)
Glucose: 105 mg/dl (ref 70–140)
Potassium: 4.3 mEq/L (ref 3.5–5.1)
Total Bilirubin: 1.37 mg/dL — ABNORMAL HIGH (ref 0.20–1.20)

## 2013-03-04 LAB — CBC WITH DIFFERENTIAL/PLATELET
Basophils Absolute: 0.1 10*3/uL (ref 0.0–0.1)
Eosinophils Absolute: 0.1 10*3/uL (ref 0.0–0.5)
HCT: 46.1 % (ref 38.4–49.9)
HGB: 15.9 g/dL (ref 13.0–17.1)
MCV: 97 fL (ref 79.3–98.0)
MONO%: 9.5 % (ref 0.0–14.0)
NEUT#: 3.7 10*3/uL (ref 1.5–6.5)
NEUT%: 55.3 % (ref 39.0–75.0)
RDW: 16.3 % — ABNORMAL HIGH (ref 11.0–14.6)
WBC: 6.8 10*3/uL (ref 4.0–10.3)
lymph#: 2.2 10*3/uL (ref 0.9–3.3)

## 2013-03-04 LAB — UA PROTEIN, DIPSTICK - CHCC: Protein, ur: NEGATIVE mg/dL

## 2013-03-04 MED ORDER — SODIUM CHLORIDE 0.9 % IV SOLN
7.5000 mg/kg | Freq: Once | INTRAVENOUS | Status: AC
  Administered 2013-03-04: 675 mg via INTRAVENOUS
  Filled 2013-03-04: qty 27

## 2013-03-04 MED ORDER — SODIUM CHLORIDE 0.9 % IJ SOLN
10.0000 mL | INTRAMUSCULAR | Status: DC | PRN
  Administered 2013-03-04: 10 mL
  Filled 2013-03-04: qty 10

## 2013-03-04 MED ORDER — HEPARIN SOD (PORK) LOCK FLUSH 100 UNIT/ML IV SOLN
500.0000 [IU] | Freq: Once | INTRAVENOUS | Status: AC | PRN
  Administered 2013-03-04: 500 [IU]
  Filled 2013-03-04: qty 5

## 2013-03-04 MED ORDER — SODIUM CHLORIDE 0.9 % IV SOLN
Freq: Once | INTRAVENOUS | Status: AC
  Administered 2013-03-04: 10:00:00 via INTRAVENOUS

## 2013-03-04 NOTE — Telephone Encounter (Signed)
Per staff message and POF I have scheduled appts.  JMW  

## 2013-03-04 NOTE — Telephone Encounter (Signed)
gv and printed appt sched and avs for pt for NOV adn DEC...emailed MW to add tx...gv pt barium

## 2013-03-04 NOTE — Patient Instructions (Addendum)
Bevacizumab injection (Avastin)  What is this medicine? BEVACIZUMAB (be va SIZ yoo mab) is a chemotherapy drug. It targets a protein found in many cancer cell types, and halts cancer growth. This drug treats many cancers including non-small cell lung cancer, and colon or rectal cancer. It is usually given with other chemotherapy drugs. This medicine may be used for other purposes; ask your health care provider or pharmacist if you have questions. What should I tell my health care provider before I take this medicine? They need to know if you have any of these conditions: -blood clots -heart disease, including heart failure, heart attack, or chest pain (angina) -high blood pressure -infection (especially a virus infection such as chickenpox, cold sores, or herpes) -kidney disease -lung disease -prior chemotherapy with doxorubicin, daunorubicin, epirubicin, or other anthracycline type chemotherapy agents -recent or ongoing radiation therapy -recent surgery -stroke -an unusual or allergic reaction to bevacizumab, hamster proteins, mouse proteins, other medicines, foods, dyes, or preservatives -pregnant or trying to get pregnant -breast-feeding How should I use this medicine? This medicine is for infusion into a vein. It is given by a health care professional in a hospital or clinic setting. Talk to your pediatrician regarding the use of this medicine in children. Special care may be needed. Overdosage: If you think you have taken too much of this medicine contact a poison control center or emergency room at once. NOTE: This medicine is only for you. Do not share this medicine with others. What if I miss a dose? It is important not to miss your dose. Call your doctor or health care professional if you are unable to keep an appointment. What may interact with this medicine? Interactions are not expected. This list may not describe all possible interactions. Give your health care provider a  list of all the medicines, herbs, non-prescription drugs, or dietary supplements you use. Also tell them if you smoke, drink alcohol, or use illegal drugs. Some items may interact with your medicine. What should I watch for while using this medicine? Your condition will be monitored carefully while you are receiving this medicine. You will need important blood work and urine testing done while you are taking this medicine. During your treatment, let your health care professional know if you have any unusual symptoms, such as difficulty breathing. This medicine may rarely cause 'gastrointestinal perforation' (holes in the stomach, intestines or colon), a serious side effect requiring surgery to repair. This medicine should be started at least 28 days following major surgery and the site of the surgery should be totally healed. Check with your doctor before scheduling dental work or surgery while you are receiving this treatment. Talk to your doctor if you have recently had surgery or if you have a wound that has not healed. Do not become pregnant while taking this medicine. Women should inform their doctor if they wish to become pregnant or think they might be pregnant. There is a potential for serious side effects to an unborn child. Talk to your health care professional or pharmacist for more information. Do not breast-feed an infant while taking this medicine. This medicine has caused ovarian failure in some women. This medicine may interfere with the ability to have a child. You should talk to your doctor or health care professional if you are concerned about your fertility. What side effects may I notice from receiving this medicine? Side effects that you should report to your doctor or health care professional as soon as possible: -allergic reactions  like skin rash, itching or hives, swelling of the face, lips, or tongue -signs of infection - fever or chills, cough, sore throat, pain or trouble  passing urine -signs of decreased platelets or bleeding - bruising, pinpoint red spots on the skin, black, tarry stools, nosebleeds, blood in the urine -breathing problems -changes in vision -chest pain -confusion -jaw pain, especially after dental work -mouth sores -seizures -severe abdominal pain -severe headache -sudden numbness or weakness of the face, arm or leg -swelling of legs or ankles -symptoms of a stroke: change in mental awareness, inability to talk or move one side of the body (especially in patients with lung cancer) -trouble passing urine or change in the amount of urine -trouble speaking or understanding -trouble walking, dizziness, loss of balance or coordination Side effects that usually do not require medical attention (report to your doctor or health care professional if they continue or are bothersome): -constipation -diarrhea -dry skin -headache -loss of appetite -nausea, vomiting This list may not describe all possible side effects. Call your doctor for medical advice about side effects. You may report side effects to FDA at 1-800-FDA-1088. Where should I keep my medicine? This drug is given in a hospital or clinic and will not be stored at home. NOTE: This sheet is a summary. It may not cover all possible information. If you have questions about this medicine, talk to your doctor, pharmacist, or health care provider.  2012, [employer]vier/Gold Standard. (04/01/2010 4:25:37 PM)

## 2013-03-05 NOTE — Progress Notes (Signed)
   [facility]    OFFICE PROGRESS NOTE   INTERVAL HISTORY:   Lawrence Villa returns for scheduled followup of metastatic colon cancer. He feels well. He continues to work. He completed another cycle of Xeloda/Avastin beginning on 02/11/2013. No mouth sores, diarrhea, or symptoms of thrombosis. No bleeding. He has occasional mild nausea. There is skin thickening and callus formation over the hands and feet. No pain.  Objective:  Vital signs in last 24 hours:  Blood pressure 136/88, pulse 62, temperature 98.7 F (37.1 C), temperature source Oral, resp. rate 18, height 6' (1.829 m), weight 209 lb (94.802 kg).    HEENT: No thrush or ulcers, mild angular cheilitis Resp: Lungs clear bilaterally Cardio: Regular rate and rhythm GI: No hepatomegaly, nontender, no mass Vascular: No leg edema  Skin: Skin thickening and callus formation over the palms and soles. No ulcers.   Portacath/PICC-without erythema  Lab Results:  Lab Results  Component Value Date   WBC 6.8 03/04/2013   HGB 15.9 03/04/2013   HCT 46.1 03/04/2013   MCV 97.0 03/04/2013   PLT 203 03/04/2013   ANC 3.7  CEA 4.0 on 02/11/2013  Medications: I have reviewed the patient's current medications.  Assessment/Plan: 1. Metastatic colon cancer status post low anterior resection on 12/22/2011 (T4, N2, M1). Initiation of CAPOX/Avastin on 01/23/2012. The CEA was in normal range on 05/28/2012. Restaging CT 04/12/2012 after 4 cycles of CAPOX/Avastin confirmed improvement in the hepatic metastases. He completed cycle 7 CAPOX/Avastin beginning 06/18/2012. He completed cycle 8 systemic therapy with Xeloda and Avastin (oxaliplatin discontinued) beginning 07/09/2012. He completed cycle 9 beginning 07/30/2012 and cycle 10 beginning on 08/20/2012. Restaging CT evaluation 08/13/2012 with further improvement in liver metastases. He began another cycle of Xeloda on 09/10/2012. He declined further Avastin. He completed a cycle of  dose reduced Xeloda beginning on 10/01/2012 and was then maintained off of systemic therapy. Systemic therapy resumed with Xeloda/Avastin 12/31/2012 (completed 3 days of Xeloda with cycle 1). 2. Microcytic anemia-resolved. 3. History of Weight loss secondary to metastatic colon cancer.  4. Status post Port-A-Cath placement 01/10/2012. 5. Hand-foot syndrome secondary to Xeloda. The Xeloda was dose reduced to 1000 mg twice daily beginning with cycle 4 CAPOX. The hand-foot symptoms have improved 6. Mild neutropenia 07/09/2012.     Disposition:  Lawrence Villa appears stable. The plan is to continue Xeloda/Avastin for 2 more cycles and then a restaging CT evaluation. We will check a CEA when he returns in 3 weeks.   Thornton Papas, MD  03/05/2013  9:26 AM

## 2013-03-23 ENCOUNTER — Other Ambulatory Visit: Payer: Self-pay | Admitting: Oncology

## 2013-03-24 ENCOUNTER — Other Ambulatory Visit: Payer: Self-pay | Admitting: *Deleted

## 2013-03-24 NOTE — Telephone Encounter (Signed)
THIS REFILL REQUEST FOR CAPECITABINE WAS GIVEN TO DR.SHERRILL'S NURSE, SUSAN COWARD,RN. 

## 2013-03-25 ENCOUNTER — Ambulatory Visit (HOSPITAL_BASED_OUTPATIENT_CLINIC_OR_DEPARTMENT_OTHER): Payer: Federal, State, Local not specified - PPO | Admitting: Oncology

## 2013-03-25 ENCOUNTER — Ambulatory Visit (HOSPITAL_BASED_OUTPATIENT_CLINIC_OR_DEPARTMENT_OTHER): Payer: Federal, State, Local not specified - PPO

## 2013-03-25 ENCOUNTER — Other Ambulatory Visit (HOSPITAL_BASED_OUTPATIENT_CLINIC_OR_DEPARTMENT_OTHER): Payer: Federal, State, Local not specified - PPO | Admitting: Lab

## 2013-03-25 VITALS — BP 136/90 | HR 59 | Temp 97.5°F | Resp 18 | Ht 72.0 in | Wt 212.5 lb

## 2013-03-25 VITALS — BP 125/78 | HR 50

## 2013-03-25 DIAGNOSIS — L270 Generalized skin eruption due to drugs and medicaments taken internally: Secondary | ICD-10-CM

## 2013-03-25 DIAGNOSIS — Z5111 Encounter for antineoplastic chemotherapy: Secondary | ICD-10-CM

## 2013-03-25 DIAGNOSIS — C801 Malignant (primary) neoplasm, unspecified: Secondary | ICD-10-CM

## 2013-03-25 DIAGNOSIS — C19 Malignant neoplasm of rectosigmoid junction: Secondary | ICD-10-CM

## 2013-03-25 DIAGNOSIS — C787 Secondary malignant neoplasm of liver and intrahepatic bile duct: Secondary | ICD-10-CM

## 2013-03-25 DIAGNOSIS — Z5112 Encounter for antineoplastic immunotherapy: Secondary | ICD-10-CM

## 2013-03-25 DIAGNOSIS — C187 Malignant neoplasm of sigmoid colon: Secondary | ICD-10-CM

## 2013-03-25 LAB — CBC WITH DIFFERENTIAL/PLATELET
BASO%: 0.5 % (ref 0.0–2.0)
Basophils Absolute: 0 10*3/uL (ref 0.0–0.1)
EOS%: 1.8 % (ref 0.0–7.0)
Eosinophils Absolute: 0.1 10*3/uL (ref 0.0–0.5)
HCT: 44.4 % (ref 38.4–49.9)
HGB: 15.4 g/dL (ref 13.0–17.1)
LYMPH%: 37.8 % (ref 14.0–49.0)
MCH: 33.6 pg — ABNORMAL HIGH (ref 27.2–33.4)
MCV: 96.9 fL (ref 79.3–98.0)
MONO%: 8.7 % (ref 0.0–14.0)
NEUT#: 3.1 10*3/uL (ref 1.5–6.5)
RBC: 4.58 10*6/uL (ref 4.20–5.82)
RDW: 16.1 % — ABNORMAL HIGH (ref 11.0–14.6)
lymph#: 2.3 10*3/uL (ref 0.9–3.3)
nRBC: 0 % (ref 0–0)

## 2013-03-25 LAB — UA PROTEIN, DIPSTICK - CHCC: Protein, ur: NEGATIVE mg/dL

## 2013-03-25 LAB — COMPREHENSIVE METABOLIC PANEL (CC13)
ALT: 31 U/L (ref 0–55)
AST: 41 U/L — ABNORMAL HIGH (ref 5–34)
Albumin: 4 g/dL (ref 3.5–5.0)
BUN: 18.7 mg/dL (ref 7.0–26.0)
CO2: 25 mEq/L (ref 22–29)
Calcium: 9.7 mg/dL (ref 8.4–10.4)
Chloride: 105 mEq/L (ref 98–109)
Creatinine: 0.9 mg/dL (ref 0.7–1.3)
Potassium: 4.4 mEq/L (ref 3.5–5.1)

## 2013-03-25 MED ORDER — CAPECITABINE 500 MG PO TABS: ORAL_TABLET | ORAL | Status: DC

## 2013-03-25 MED ORDER — SODIUM CHLORIDE 0.9 % IJ SOLN
10.0000 mL | INTRAMUSCULAR | Status: DC | PRN
  Administered 2013-03-25: 10 mL
  Filled 2013-03-25: qty 10

## 2013-03-25 MED ORDER — HEPARIN SOD (PORK) LOCK FLUSH 100 UNIT/ML IV SOLN
500.0000 [IU] | Freq: Once | INTRAVENOUS | Status: AC | PRN
  Administered 2013-03-25: 500 [IU]
  Filled 2013-03-25: qty 5

## 2013-03-25 MED ORDER — SODIUM CHLORIDE 0.9 % IV SOLN
Freq: Once | INTRAVENOUS | Status: AC
  Administered 2013-03-25: 12:00:00 via INTRAVENOUS

## 2013-03-25 MED ORDER — SODIUM CHLORIDE 0.9 % IV SOLN
7.5000 mg/kg | Freq: Once | INTRAVENOUS | Status: AC
  Administered 2013-03-25: 675 mg via INTRAVENOUS
  Filled 2013-03-25: qty 27

## 2013-03-25 NOTE — Progress Notes (Signed)
   [facility]    OFFICE PROGRESS NOTE   INTERVAL HISTORY:   He completed another cycle of Xeloda/Avastin beginning on 03/04/2013. No symptom of thrombosis. One episode of rectal bleeding. He continues to have erythema, callus formation, and desquamation over the palms and soles. Mild associated discomfort. He continues to work. He ambulates without difficulty. No mouth sores or diarrhea. Mild right abdominal discomfort.  Objective:  Vital signs in last 24 hours:  Blood pressure 136/90, pulse 59, temperature 97.5 F (36.4 C), temperature source Oral, resp. rate 18, height 6' (1.829 m), weight 212 lb 8 oz (96.389 kg), SpO2 100.00%.    HEENT: No thrush or ulcers, angular chelitis Resp: Lungs clear bilaterally Cardio: Regular rate and rhythm GI: No hepatomegaly, mild right abdominal tenderness, no mass Vascular: No leg edema  Skin: Erythema, callus formation, and superficial desquamation at the palms and soles.   Portacath/PICC-without erythema  Lab Results:  Lab Results  Component Value Date   WBC 6.0 03/25/2013   HGB 15.4 03/25/2013   HCT 44.4 03/25/2013   MCV 96.9 03/25/2013   PLT 215 03/25/2013   ANC 3.1    Medications: I have reviewed the patient's current medications.  Assessment/Plan: 1. Metastatic colon cancer status post low anterior resection on 12/22/2011 (T4, N2, M1). Initiation of CAPOX/Avastin on 01/23/2012. The CEA was in normal range on 05/28/2012. Restaging CT 04/12/2012 after 4 cycles of CAPOX/Avastin confirmed improvement in the hepatic metastases. He completed cycle 7 CAPOX/Avastin beginning 06/18/2012. He completed cycle 8 systemic therapy with Xeloda and Avastin (oxaliplatin discontinued) beginning 07/09/2012. He completed cycle 9 beginning 07/30/2012 and cycle 10 beginning on 08/20/2012. Restaging CT evaluation 08/13/2012 with further improvement in liver metastases. He began another cycle of Xeloda on 09/10/2012. He declined further  Avastin. He completed a cycle of dose reduced Xeloda beginning on 10/01/2012 and was then maintained off of systemic therapy. Systemic therapy resumed with Xeloda/Avastin 12/31/2012 (completed 3 days of Xeloda with cycle 1). 2. Microcytic anemia-resolved. 3. History of Weight loss secondary to metastatic colon cancer.  4. Status post Port-A-Cath placement 01/10/2012. 5. Hand-foot syndrome secondary to Xeloda. The Xeloda was dose reduced to 1000 mg twice daily beginning with cycle 4 CAPOX. The hand-foot symptoms have progressed. 6. Mild neutropenia 07/09/2012.  7. Glaucoma-now on medical therapy.   Disposition:  His overall status appears unchanged, but he has developed increased hand/foot syndrome. We decided to hold the current cycle of Xeloda for one week. He will complete treatment with Avastin today. He will begin the next cycle of Xeloda in one week if the hand/foot symptoms have improved. The Xeloda will be dose reduced to 1000 g in the morning and 500 mg at night. He will contact us if the hands and feet are not better next week.  He will undergo a restaging CT evaluation prior to the next office visit 04/15/2013. We will followup on the CEA from today.   Thornton Papas, MD  03/25/2013  11:05 AM

## 2013-03-25 NOTE — Patient Instructions (Signed)
[facility] Discharge Instructions for Patients Receiving Chemotherapy  Today you received the following chemotherapy agents Avastin.  To help prevent nausea and vomiting after your treatment, we encourage you to take your nausea medication as prescribed.   If you develop nausea and vomiting that is not controlled by your nausea medication, call the clinic.   BELOW ARE SYMPTOMS THAT SHOULD BE REPORTED IMMEDIATELY:  *FEVER GREATER THAN 100.5 F  *CHILLS WITH OR WITHOUT FEVER  NAUSEA AND VOMITING THAT IS NOT CONTROLLED WITH YOUR NAUSEA MEDICATION  *UNUSUAL SHORTNESS OF BREATH  *UNUSUAL BRUISING OR BLEEDING  TENDERNESS IN MOUTH AND THROAT WITH OR WITHOUT PRESENCE OF ULCERS  *URINARY PROBLEMS  *BOWEL PROBLEMS  UNUSUAL RASH Items with * indicate a potential emergency and should be followed up as soon as possible.  Feel free to call the clinic you have any questions or concerns. The clinic phone number is [phone].    

## 2013-03-26 ENCOUNTER — Telehealth: Payer: Self-pay | Admitting: Oncology

## 2013-03-26 ENCOUNTER — Telehealth: Payer: Self-pay | Admitting: *Deleted

## 2013-03-26 LAB — CEA: CEA: 5.6 ng/mL — ABNORMAL HIGH (ref 0.0–5.0)

## 2013-03-26 NOTE — Telephone Encounter (Signed)
Called pt and left appt for lab,Ct,chemo and Md on December 2014

## 2013-03-26 NOTE — Telephone Encounter (Signed)
Message copied by Wandalee Ferdinand on Wed Mar 26, 2013  5:00 PM ------      Message from: Thornton Papas B      Created: Wed Mar 26, 2013  7:24 AM       Please call patient, cea is slightly higher, proceed with CT as scheduled ------

## 2013-03-26 NOTE — Telephone Encounter (Signed)
Left VM to call office regarding labs. 

## 2013-03-27 ENCOUNTER — Telehealth: Payer: Self-pay | Admitting: *Deleted

## 2013-03-27 NOTE — Telephone Encounter (Signed)
VM from patient request CEA results. OK to leave on his voice mail. Called back and provided results and to proceed with CT scan as scheduled. Made him aware that his status is not judged solely by #'s, but also by his performance status and scan results.

## 2013-04-11 ENCOUNTER — Other Ambulatory Visit: Payer: Self-pay | Admitting: Oncology

## 2013-04-11 ENCOUNTER — Other Ambulatory Visit: Payer: Self-pay | Admitting: *Deleted

## 2013-04-11 DIAGNOSIS — C19 Malignant neoplasm of rectosigmoid junction: Secondary | ICD-10-CM

## 2013-04-14 ENCOUNTER — Encounter (INDEPENDENT_AMBULATORY_CARE_PROVIDER_SITE_OTHER): Payer: Self-pay

## 2013-04-14 ENCOUNTER — Encounter (HOSPITAL_COMMUNITY): Payer: Self-pay

## 2013-04-14 ENCOUNTER — Other Ambulatory Visit (HOSPITAL_BASED_OUTPATIENT_CLINIC_OR_DEPARTMENT_OTHER): Payer: Federal, State, Local not specified - PPO

## 2013-04-14 ENCOUNTER — Ambulatory Visit (HOSPITAL_COMMUNITY)
Admission: RE | Admit: 2013-04-14 | Discharge: 2013-04-14 | Disposition: A | Discharge status: Home / Self Care | Payer: Federal, State, Local not specified - PPO | Source: Ambulatory Visit | Attending: Oncology | Admitting: Oncology

## 2013-04-14 DIAGNOSIS — C787 Secondary malignant neoplasm of liver and intrahepatic bile duct: Secondary | ICD-10-CM | POA: Insufficient documentation

## 2013-04-14 DIAGNOSIS — C187 Malignant neoplasm of sigmoid colon: Secondary | ICD-10-CM

## 2013-04-14 DIAGNOSIS — Z923 Personal history of irradiation: Secondary | ICD-10-CM | POA: Insufficient documentation

## 2013-04-14 DIAGNOSIS — C19 Malignant neoplasm of rectosigmoid junction: Secondary | ICD-10-CM

## 2013-04-14 DIAGNOSIS — C189 Malignant neoplasm of colon, unspecified: Secondary | ICD-10-CM | POA: Insufficient documentation

## 2013-04-14 DIAGNOSIS — Z79899 Other long term (current) drug therapy: Secondary | ICD-10-CM | POA: Insufficient documentation

## 2013-04-14 LAB — CBC WITH DIFFERENTIAL/PLATELET
Eosinophils Absolute: 0.2 10*3/uL (ref 0.0–0.5)
HCT: 47.6 % (ref 38.4–49.9)
LYMPH%: 31.5 % (ref 14.0–49.0)
MCH: 34.3 pg — ABNORMAL HIGH (ref 27.2–33.4)
MCHC: 34 g/dL (ref 32.0–36.0)
MCV: 101 fL — ABNORMAL HIGH (ref 79.3–98.0)
MONO#: 0.8 10*3/uL (ref 0.1–0.9)
MONO%: 11.1 % (ref 0.0–14.0)
NEUT#: 4 10*3/uL (ref 1.5–6.5)
NEUT%: 53.7 % (ref 39.0–75.0)
Platelets: 208 10*3/uL (ref 140–400)
RDW: 16.6 % — ABNORMAL HIGH (ref 11.0–14.6)
WBC: 7.5 10*3/uL (ref 4.0–10.3)

## 2013-04-14 LAB — COMPREHENSIVE METABOLIC PANEL (CC13)
AST: 36 U/L — ABNORMAL HIGH (ref 5–34)
Anion Gap: 9 mEq/L (ref 3–11)
CO2: 25 mEq/L (ref 22–29)
Creatinine: 1 mg/dL (ref 0.7–1.3)
Glucose: 100 mg/dl (ref 70–140)
Total Bilirubin: 0.99 mg/dL (ref 0.20–1.20)

## 2013-04-14 MED ORDER — IOHEXOL 300 MG/ML  SOLN: 100.0000 mL | Freq: Once | INTRAMUSCULAR | Status: AC | PRN

## 2013-04-15 ENCOUNTER — Ambulatory Visit (HOSPITAL_BASED_OUTPATIENT_CLINIC_OR_DEPARTMENT_OTHER): Payer: Federal, State, Local not specified - PPO | Admitting: Oncology

## 2013-04-15 ENCOUNTER — Telehealth: Payer: Self-pay | Admitting: *Deleted

## 2013-04-15 ENCOUNTER — Telehealth: Payer: Self-pay | Admitting: Oncology

## 2013-04-15 ENCOUNTER — Ambulatory Visit (HOSPITAL_BASED_OUTPATIENT_CLINIC_OR_DEPARTMENT_OTHER): Payer: Federal, State, Local not specified - PPO

## 2013-04-15 ENCOUNTER — Ambulatory Visit: Payer: Federal, State, Local not specified - PPO | Admitting: Lab

## 2013-04-15 VITALS — BP 112/73 | HR 59

## 2013-04-15 VITALS — BP 142/83 | HR 71 | Temp 97.2°F | Resp 20 | Ht 72.0 in | Wt 216.1 lb

## 2013-04-15 DIAGNOSIS — C19 Malignant neoplasm of rectosigmoid junction: Secondary | ICD-10-CM

## 2013-04-15 DIAGNOSIS — C7800 Secondary malignant neoplasm of unspecified lung: Secondary | ICD-10-CM

## 2013-04-15 DIAGNOSIS — C787 Secondary malignant neoplasm of liver and intrahepatic bile duct: Secondary | ICD-10-CM

## 2013-04-15 DIAGNOSIS — C187 Malignant neoplasm of sigmoid colon: Secondary | ICD-10-CM

## 2013-04-15 DIAGNOSIS — Z5112 Encounter for antineoplastic immunotherapy: Secondary | ICD-10-CM

## 2013-04-15 MED ORDER — HEPARIN SOD (PORK) LOCK FLUSH 100 UNIT/ML IV SOLN
500.0000 [IU] | Freq: Once | INTRAVENOUS | Status: AC
  Administered 2013-04-15: 500 [IU] via INTRAVENOUS
  Filled 2013-04-15: qty 5

## 2013-04-15 MED ORDER — SODIUM CHLORIDE 0.9 % IJ SOLN
10.0000 mL | INTRAMUSCULAR | Status: DC | PRN
  Administered 2013-04-15: 10 mL via INTRAVENOUS
  Filled 2013-04-15: qty 10

## 2013-04-15 MED ORDER — SODIUM CHLORIDE 0.9 % IV SOLN
Freq: Once | INTRAVENOUS | Status: AC
  Administered 2013-04-15: 10:00:00 via INTRAVENOUS

## 2013-04-15 MED ORDER — SODIUM CHLORIDE 0.9 % IV SOLN
7.5000 mg/kg | Freq: Once | INTRAVENOUS | Status: AC
  Administered 2013-04-15: 675 mg via INTRAVENOUS
  Filled 2013-04-15: qty 27

## 2013-04-15 NOTE — Patient Instructions (Signed)
[facility] Discharge Instructions for Patients Receiving Chemotherapy  Today you received the following chemotherapy agents Avastin.  To help prevent nausea and vomiting after your treatment, we encourage you to take your nausea medication as prescribed.   If you develop nausea and vomiting that is not controlled by your nausea medication, call the clinic.   BELOW ARE SYMPTOMS THAT SHOULD BE REPORTED IMMEDIATELY:  *FEVER GREATER THAN 100.5 F  *CHILLS WITH OR WITHOUT FEVER  NAUSEA AND VOMITING THAT IS NOT CONTROLLED WITH YOUR NAUSEA MEDICATION  *UNUSUAL SHORTNESS OF BREATH  *UNUSUAL BRUISING OR BLEEDING  TENDERNESS IN MOUTH AND THROAT WITH OR WITHOUT PRESENCE OF ULCERS  *URINARY PROBLEMS  *BOWEL PROBLEMS  UNUSUAL RASH Items with * indicate a potential emergency and should be followed up as soon as possible.  Feel free to call the clinic you have any questions or concerns. The clinic phone number is [phone].    

## 2013-04-15 NOTE — Progress Notes (Signed)
[facility]    OFFICE PROGRESS NOTE   INTERVAL HISTORY:   Lawrence Villa completed another treatment with Avastin on 03/25/2013. The Xeloda was delayed until 04/01/2013 secondary to hand/foot syndrome. The hand and foot symptoms improved and he completed the current cycle of Xeloda this morning. The Xeloda was dose reduced with this cycle.  No new complaint. Occasional right abdominal pain.  Objective:  Vital signs in last 24 hours:  Blood pressure 142/83, pulse 71, temperature 97.2 F (36.2 C), temperature source Oral, resp. rate 20, height 6' (1.829 m), weight 216 lb 1.6 oz (98.022 kg).    HEENT: No thrush or ulcers Lymphatics: No cervical, supraclavicular, axillary, or inguinal nodes Resp: Lungs clear bilaterally Cardio: Regular rate and rhythm GI: No hepatomegaly, nontender, no mass Vascular: No leg edema  Skin: Mild skin thickening and erythema at the palms and soles. Callous formation over the soles.   Portacath/PICC-without erythema  Lab Results:  Lab Results  Component Value Date   WBC 7.5 04/14/2013   HGB 16.2 04/14/2013   HCT 47.6 04/14/2013   MCV 101.0* 04/14/2013   PLT 208 04/14/2013   ANC 4.0  CEA on 03/25/2013-5.6   X-rays: Restaging CT of the abdomen and pelvis on 04/14/2013, compared to 08/13/2012: 2 lesions in the liver don't have increased in size. Partially calcified metastases in the right and left liver are stable. Several subcentimeter noncalcified lesions in the right liver show a mild decrease in size. No other site of metastatic disease in the abdomen or pelvis.  Medications: I have reviewed the patient's current medications.  Assessment/Plan: 1. Metastatic colon cancer status post low anterior resection on 12/22/2011 (T4, N2, M1). Initiation of CAPOX/Avastin on 01/23/2012. The CEA was in normal range on 05/28/2012. Restaging CT 04/12/2012 after 4 cycles of CAPOX/Avastin confirmed improvement in the hepatic metastases. He completed  cycle 7 CAPOX/Avastin beginning 06/18/2012. He completed cycle 8 systemic therapy with Xeloda and Avastin (oxaliplatin discontinued) beginning 07/09/2012. He completed cycle 9 beginning 07/30/2012 and cycle 10 beginning on 08/20/2012. Restaging CT evaluation 08/13/2012 with further improvement in liver metastases. He began another cycle of Xeloda on 09/10/2012. He declined further Avastin. He completed a cycle of dose reduced Xeloda beginning on 10/01/2012 and was then maintained off of systemic therapy. Systemic therapy resumed with Xeloda/Avastin 12/31/2012 (completed 3 days of Xeloda with cycle 1). The Xeloda was dose reduced with the cycle of chemotherapy beginning 04/01/2013  Restaging CT 04/14/2013, compared to 08/13/2012 revealed a mixed response in the liver 2. Microcytic anemia-resolved. 3. History of Weight loss secondary to metastatic colon cancer.  4. Status post Port-A-Cath placement 01/10/2012. 5. Hand-foot syndrome secondary to Xeloda. The Xeloda was dose reduced to 1000 mg twice daily beginning with cycle 4 CAPOX. The hand-foot symptoms progressed. The Xeloda was further dose reduced beginning with treatment on 04/01/2013. 6. Mild neutropenia 07/09/2012.  7. Glaucoma-now on medical therapy.    Disposition:  I reviewed the restaging CT images with Lawrence Villa. Some of the liver lesions are larger and others are smaller compared to a CT from April of 2014. He was off of systemic therapy between June and mid August of 2014. His overall clinical status is unchanged. The CEA was slightly higher on 03/25/2013.  I will present his case at the GI tumor conference on 04/16/2013 to review the CT images and discuss the potential for hepatic directed therapy. We decided to continue Xeloda/Avastin for now. He will complete another treatment with Avastin today and the  next cycle of Xeloda will begin on 04/22/2013.  Lawrence Villa will return for an office visit and Avastin on 05/06/2013.   Thornton Papas, MD  04/15/2013  5:30 PM

## 2013-04-15 NOTE — Telephone Encounter (Signed)
appts made per 12/2 POF Email to MW to add Tx AVS and CAL given shh

## 2013-04-15 NOTE — Telephone Encounter (Signed)
Per staff message and POF I have scheduled appts.  JMW  

## 2013-04-15 NOTE — Progress Notes (Signed)
Request for extended KRAS testing per order from Dr. Truett Perna.  Spoke with J. Epps in pathology to have [facility] perform test on Accession #: [id number]. This RN also met with patient to assess for any needs or barriers to care.  Patient denied needing anything at present time.  He stated he was planning on attending the GI support group program this month. This RN reminded patient to call  If anything changes or he needs assistance.

## 2013-04-16 ENCOUNTER — Telehealth: Payer: Self-pay | Admitting: *Deleted

## 2013-04-16 ENCOUNTER — Other Ambulatory Visit: Payer: Self-pay | Admitting: *Deleted

## 2013-04-16 DIAGNOSIS — C801 Malignant (primary) neoplasm, unspecified: Secondary | ICD-10-CM

## 2013-04-16 DIAGNOSIS — C787 Secondary malignant neoplasm of liver and intrahepatic bile duct: Secondary | ICD-10-CM

## 2013-04-16 LAB — CEA: CEA: 7.9 ng/mL — ABNORMAL HIGH (ref 0.0–5.0)

## 2013-04-16 MED ORDER — CAPECITABINE 500 MG PO TABS: ORAL_TABLET | ORAL | Status: DC

## 2013-04-16 NOTE — Telephone Encounter (Signed)
Message copied by Caleb Popp on Wed Apr 16, 2013  4:04 PM ------      Message from: Thornton Papas B      Created: Wed Apr 16, 2013  2:37 PM       Please call patient, cea is slightly higher, proceed with mri as recommend by surgery ------

## 2013-04-17 ENCOUNTER — Other Ambulatory Visit (HOSPITAL_COMMUNITY)
Admission: RE | Admit: 2013-04-17 | Discharge: 2013-04-17 | Disposition: A | Discharge status: Home / Self Care | Payer: Federal, State, Local not specified - PPO | Source: Ambulatory Visit | Attending: Oncology | Admitting: Oncology

## 2013-04-17 DIAGNOSIS — C189 Malignant neoplasm of colon, unspecified: Secondary | ICD-10-CM | POA: Insufficient documentation

## 2013-04-17 DIAGNOSIS — C787 Secondary malignant neoplasm of liver and intrahepatic bile duct: Secondary | ICD-10-CM | POA: Insufficient documentation

## 2013-04-17 NOTE — Telephone Encounter (Signed)
Call from pt, checking to be sure Xeloda has been reordered-- Yes. Pt stated he would like to hold off on surgery for now. Asks should he have MRI now if he doesn't plan to consider surgery until at least after the new year? Would like to try Xeloda + Oxaliplatin. Feels confident that this "will work." Would like to discuss things at next office visit 12/23. Will make Dr. Truett Perna aware.

## 2013-04-17 NOTE — Telephone Encounter (Addendum)
Pt returned call, left message requesting I leave CEA results on voicemail. Same done. Per Dr. Truett Perna: Case was presented at conference, may be a candidate for R liver resection. Surgeons recommend liver MRI with contrast and follow up appt with Dr. Donell Beers.

## 2013-04-18 NOTE — Telephone Encounter (Signed)
Left message on voicemail for pt as he requested. Dr. Truett Perna recommends proceeding with MRI prior to next office visit. Will discuss treatment options at next office visit.

## 2013-04-25 ENCOUNTER — Other Ambulatory Visit: Payer: Self-pay | Admitting: Oncology

## 2013-04-25 DIAGNOSIS — C19 Malignant neoplasm of rectosigmoid junction: Secondary | ICD-10-CM

## 2013-04-27 ENCOUNTER — Other Ambulatory Visit: Payer: Self-pay | Admitting: Oncology

## 2013-05-01 ENCOUNTER — Ambulatory Visit (HOSPITAL_COMMUNITY)
Admission: RE | Admit: 2013-05-01 | Discharge: 2013-05-01 | Disposition: A | Discharge status: Home / Self Care | Payer: Federal, State, Local not specified - PPO | Source: Ambulatory Visit | Attending: Oncology | Admitting: Oncology

## 2013-05-01 DIAGNOSIS — K7689 Other specified diseases of liver: Secondary | ICD-10-CM | POA: Insufficient documentation

## 2013-05-01 DIAGNOSIS — C787 Secondary malignant neoplasm of liver and intrahepatic bile duct: Secondary | ICD-10-CM | POA: Insufficient documentation

## 2013-05-01 DIAGNOSIS — C19 Malignant neoplasm of rectosigmoid junction: Secondary | ICD-10-CM | POA: Insufficient documentation

## 2013-05-01 MED ORDER — GADOBENATE DIMEGLUMINE 529 MG/ML IV SOLN
20.0000 mL | Freq: Once | INTRAVENOUS | Status: AC | PRN
  Administered 2013-05-01: 20 mL via INTRAVENOUS

## 2013-05-02 ENCOUNTER — Other Ambulatory Visit: Payer: Self-pay | Admitting: *Deleted

## 2013-05-02 NOTE — Telephone Encounter (Signed)
THIS REFILL REQUEST FOR CAPECITABINE WAS GIVEN TO DR.SHERRILL'S NURSE, TANYA WHITLOCK,RN. 

## 2013-05-06 ENCOUNTER — Other Ambulatory Visit (HOSPITAL_BASED_OUTPATIENT_CLINIC_OR_DEPARTMENT_OTHER): Payer: Federal, State, Local not specified - PPO

## 2013-05-06 ENCOUNTER — Ambulatory Visit (HOSPITAL_BASED_OUTPATIENT_CLINIC_OR_DEPARTMENT_OTHER): Payer: Federal, State, Local not specified - PPO

## 2013-05-06 ENCOUNTER — Ambulatory Visit (HOSPITAL_BASED_OUTPATIENT_CLINIC_OR_DEPARTMENT_OTHER): Payer: Federal, State, Local not specified - PPO | Admitting: Oncology

## 2013-05-06 VITALS — BP 129/82 | HR 53 | Temp 97.6°F | Resp 20 | Ht 72.0 in | Wt 214.6 lb

## 2013-05-06 DIAGNOSIS — R109 Unspecified abdominal pain: Secondary | ICD-10-CM

## 2013-05-06 DIAGNOSIS — C787 Secondary malignant neoplasm of liver and intrahepatic bile duct: Secondary | ICD-10-CM

## 2013-05-06 DIAGNOSIS — C187 Malignant neoplasm of sigmoid colon: Secondary | ICD-10-CM

## 2013-05-06 DIAGNOSIS — C19 Malignant neoplasm of rectosigmoid junction: Secondary | ICD-10-CM

## 2013-05-06 DIAGNOSIS — Z5112 Encounter for antineoplastic immunotherapy: Secondary | ICD-10-CM

## 2013-05-06 DIAGNOSIS — C801 Malignant (primary) neoplasm, unspecified: Secondary | ICD-10-CM

## 2013-05-06 DIAGNOSIS — L270 Generalized skin eruption due to drugs and medicaments taken internally: Secondary | ICD-10-CM

## 2013-05-06 LAB — CBC WITH DIFFERENTIAL/PLATELET
BASO%: 0.6 % (ref 0.0–2.0)
EOS%: 2 % (ref 0.0–7.0)
Eosinophils Absolute: 0.1 10*3/uL (ref 0.0–0.5)
HCT: 45.6 % (ref 38.4–49.9)
HGB: 16.1 g/dL (ref 13.0–17.1)
LYMPH%: 32.8 % (ref 14.0–49.0)
MCH: 34.8 pg — ABNORMAL HIGH (ref 27.2–33.4)
MCHC: 35.3 g/dL (ref 32.0–36.0)
MCV: 98.7 fL — ABNORMAL HIGH (ref 79.3–98.0)
MONO%: 9.2 % (ref 0.0–14.0)
NEUT#: 3.6 10*3/uL (ref 1.5–6.5)
NEUT%: 55.4 % (ref 39.0–75.0)
Platelets: 230 10*3/uL (ref 140–400)
RBC: 4.62 10*6/uL (ref 4.20–5.82)
WBC: 6.4 10*3/uL (ref 4.0–10.3)

## 2013-05-06 LAB — UA PROTEIN, DIPSTICK - CHCC: Protein, ur: NEGATIVE mg/dL

## 2013-05-06 MED ORDER — SODIUM CHLORIDE 0.9 % IV SOLN
7.5000 mg/kg | Freq: Once | INTRAVENOUS | Status: AC
  Administered 2013-05-06: 675 mg via INTRAVENOUS
  Filled 2013-05-06: qty 27

## 2013-05-06 MED ORDER — HEPARIN SOD (PORK) LOCK FLUSH 100 UNIT/ML IV SOLN
500.0000 [IU] | Freq: Once | INTRAVENOUS | Status: AC | PRN
  Administered 2013-05-06: 500 [IU]
  Filled 2013-05-06: qty 5

## 2013-05-06 MED ORDER — CAPECITABINE 500 MG PO TABS: ORAL_TABLET | ORAL | Status: DC

## 2013-05-06 MED ORDER — SODIUM CHLORIDE 0.9 % IV SOLN
Freq: Once | INTRAVENOUS | Status: AC
  Administered 2013-05-06: 11:00:00 via INTRAVENOUS

## 2013-05-06 MED ORDER — SODIUM CHLORIDE 0.9 % IJ SOLN
10.0000 mL | INTRAMUSCULAR | Status: DC | PRN
  Administered 2013-05-06: 10 mL
  Filled 2013-05-06: qty 10

## 2013-05-06 NOTE — Progress Notes (Signed)
Patient left facility after treatment and carried his lab tube with him for CEA. Returned the tube to the nurse and declined to have the lab drawn saying he did not have time. Wants to wait until next appointment.

## 2013-05-06 NOTE — Patient Instructions (Signed)
[facility] Discharge Instructions for Patients Receiving Chemotherapy  Today you received the following chemotherapy agents Avastin.  To help prevent nausea and vomiting after your treatment, we encourage you to take your nausea medication as prescribed.   If you develop nausea and vomiting that is not controlled by your nausea medication, call the clinic.   BELOW ARE SYMPTOMS THAT SHOULD BE REPORTED IMMEDIATELY:  *FEVER GREATER THAN 100.5 F  *CHILLS WITH OR WITHOUT FEVER  NAUSEA AND VOMITING THAT IS NOT CONTROLLED WITH YOUR NAUSEA MEDICATION  *UNUSUAL SHORTNESS OF BREATH  *UNUSUAL BRUISING OR BLEEDING  TENDERNESS IN MOUTH AND THROAT WITH OR WITHOUT PRESENCE OF ULCERS  *URINARY PROBLEMS  *BOWEL PROBLEMS  UNUSUAL RASH Items with * indicate a potential emergency and should be followed up as soon as possible.  Feel free to call the clinic you have any questions or concerns. The clinic phone number is [phone].    

## 2013-05-06 NOTE — Progress Notes (Signed)
[facility]    OFFICE PROGRESS NOTE   INTERVAL HISTORY:   He returns for scheduled followup of metastatic colon cancer. He completed another treatment with Avastin on 04/15/2013. He began the most recent cycle of Xeloda 04/22/2013. He denies mouth sores, diarrhea, and hand/foot pain. Stable skin thickening at the hands and feet.  He continues to have intermittent right abdominal pain. This is not severe.  Objective:  Vital signs in last 24 hours:  Blood pressure 129/82, pulse 53, temperature 97.6 F (36.4 C), temperature source Oral, resp. rate 20, height 6' (1.829 m), weight 214 lb 9.6 oz (97.342 kg).    HEENT: No thrush or ulcers Resp: Lungs clear bilaterally Cardio: Regular rate and rhythm GI: No hepatomegaly, mild right upper quadrant tenderness, no mass Vascular: No leg edema  Skin: Skin thickening with callus formation over the soles. Mild erythema with skin thickening at the hands the   Portacath/PICC-without erythema  Lab Results:  Lab Results  Component Value Date   WBC 6.4 05/06/2013   HGB 16.1 05/06/2013   HCT 45.6 05/06/2013   MCV 98.7* 05/06/2013   PLT 230 05/06/2013   ANC 3.6  CEA on 04/15/2013-7.9  X-rays: MRI of the abdomen on 05/02/2013-single lesion in the left hepatic lobe, calcified and stable. Enlarging peripherally enhancing lesions in the right hepatic lobe. No evidence of retroperitoneal/periportal lymphadenopathy.  Medications: I have reviewed the patient's current medications.  Assessment/Plan: 1. Metastatic colon cancer status post low anterior resection on 12/22/2011 (T4, N2, M1). Initiation of CAPOX/Avastin on 01/23/2012. The CEA was in normal range on 05/28/2012. Restaging CT 04/12/2012 after 4 cycles of CAPOX/Avastin confirmed improvement in the hepatic metastases. He completed cycle 7 CAPOX/Avastin beginning 06/18/2012. He completed cycle 8 systemic therapy with Xeloda and Avastin (oxaliplatin discontinued) beginning  07/09/2012. He completed cycle 9 beginning 07/30/2012 and cycle 10 beginning on 08/20/2012. Restaging CT evaluation 08/13/2012 with further improvement in liver metastases. He began another cycle of Xeloda on 09/10/2012. He declined further Avastin. He completed a cycle of dose reduced Xeloda beginning on 10/01/2012 and was then maintained off of systemic therapy. Systemic therapy resumed with Xeloda/Avastin 12/31/2012 (completed 3 days of Xeloda with cycle 1). The Xeloda was dose reduced with the cycle of chemotherapy beginning 04/01/2013 Restaging CT 04/14/2013, compared to 08/13/2012 revealed a mixed response in the liver MRI of the abdomen 05/02/2013 confirmed a single calcified left hepatic lobe lesion and multiple peripherally enhancing right hepatic lobe lesions 2. Microcytic anemia-resolved. 3. History of Weight loss secondary to metastatic colon cancer.  4. Status post Port-A-Cath placement 01/10/2012. 5. Hand-foot syndrome secondary to Xeloda. The Xeloda was dose reduced to 1000 mg twice daily beginning with cycle 4 CAPOX. The hand-foot symptoms progressed. The Xeloda was further dose reduced beginning with treatment on 04/01/2013. 6. Mild neutropenia 07/09/2012.  7. Glaucoma-now on medical therapy.   Disposition:  He appears unchanged. The right abdominal pain could be related to the liver metastases or adhesions. I reviewed the MRI images with Mr. Bluemel. The metastatic disease is chiefly localized to the right liver. He may be a candidate for a right hepatectomy and left liver ablation procedure. He does not appear to have disease outside liver. He understands we would schedule a PET scan prior to liver directed therapy. I recommended a consultation with Drs. Ingram/Byerly to discuss liver surgery. He would like to discuss this option with his family prior to committing to an appointment.  Mr. Greenleaf would like to continue Xeloda/Avastin. He  will complete 1 week of Xeloda beginning  05/13/2013. He will return for an office visit and Avastin on 05/27/2013. The plan is to begin another cycle of Xeloda 05/27/2013.   Thornton Papas, MD  05/06/2013  6:11 PM

## 2013-05-09 ENCOUNTER — Encounter (HOSPITAL_COMMUNITY): Payer: Self-pay

## 2013-05-09 ENCOUNTER — Telehealth: Payer: Self-pay | Admitting: *Deleted

## 2013-05-09 NOTE — Telephone Encounter (Signed)
Message from pt reporting he has decided to proceed with surgery. Requests an appt with Dr. Donell Beers to discuss things. Dr. Truett Perna made aware, will send referral.

## 2013-05-13 ENCOUNTER — Other Ambulatory Visit: Payer: Self-pay | Admitting: *Deleted

## 2013-05-13 NOTE — Telephone Encounter (Signed)
Refill request for Xeloda received marked "urgent". Confirmed that his 12/23 refill was received and shipped today. This is for his next cycle due 05/29/13. Script to MD desk for signature.

## 2013-05-14 ENCOUNTER — Telehealth: Payer: Self-pay | Admitting: *Deleted

## 2013-05-14 DIAGNOSIS — C787 Secondary malignant neoplasm of liver and intrahepatic bile duct: Secondary | ICD-10-CM

## 2013-05-14 DIAGNOSIS — C801 Malignant (primary) neoplasm, unspecified: Secondary | ICD-10-CM

## 2013-05-14 MED ORDER — CAPECITABINE 500 MG PO TABS: ORAL_TABLET | ORAL | Status: DC

## 2013-05-14 NOTE — Telephone Encounter (Signed)
Was returning call from our office (not able to determine who called him). Made him aware that he has new patient appointment with Dr. Donell Beers on 06/06/13 at 0900 to discuss surgical options. Provided phone and location of office-he is agreeable to the appointment. He reports delivery of his Xeloda today. Made him aware that refill for next cycle will be sent in today.

## 2013-05-22 ENCOUNTER — Encounter (INDEPENDENT_AMBULATORY_CARE_PROVIDER_SITE_OTHER): Payer: Self-pay | Admitting: General Surgery

## 2013-05-22 ENCOUNTER — Ambulatory Visit (INDEPENDENT_AMBULATORY_CARE_PROVIDER_SITE_OTHER): Payer: Federal, State, Local not specified - PPO | Admitting: General Surgery

## 2013-05-22 VITALS — BP 124/81 | HR 80 | Temp 98.6°F | Resp 16 | Ht 72.0 in | Wt 213.8 lb

## 2013-05-22 DIAGNOSIS — C801 Malignant (primary) neoplasm, unspecified: Secondary | ICD-10-CM

## 2013-05-22 DIAGNOSIS — C787 Secondary malignant neoplasm of liver and intrahepatic bile duct: Secondary | ICD-10-CM

## 2013-05-22 NOTE — Patient Instructions (Signed)
Will schedule diagnostic laparoscopy.  This is outpatient surgery.  No extremely heavy lifting for 1-2 weeks.    Main risks are infection and pain at incision site.  Risk of bleeding if we have to biopsy anything in the liver.

## 2013-05-23 NOTE — Assessment & Plan Note (Signed)
Patient appears to have predominantly right-sided disease. There does not appear to be any active malignancy on the left side.  He has definitely had at least one lesion on the left in the past. This appears to have involuted and calcified. There was a suggestion of an additional lesion in the far left lateral lobe, but this also is not visible on the past several scans.  The patient is not excited about having a large hepatic resection. He is very concerned about the time off work. What we discussed, is proceeding with a diagnostic laparoscopy with a liver ultrasound. If he did have occult carcinomatosis or cold left-sided disease, I would not perform a right hepatectomy on them. If we knew this up front, and this may affect his treatment course. He may proceed with Y. 90 earlier.  If we were going to do a right hepatic resection, I would ask interventional radiology to perform right portal vein embolization. This will allow some significant left-sided hypertrophy. His left side is quite small, and I do not think his future liver remnant that would be adequate considering the amount of chemotherapy that he has received. He does not have any frank signs of cirrhosis, but he has received irintecan for a period of time.  This does predispose patients to significant steatosis and increased surgical complications.  I reviewed the risk of diagnostic laparoscopy with ultrasound. The main risk are bleeding, ultrasound, damage to adjacent structures, finding carcinomatosis, and other standard surgical complications such as pneumonia, cardiac issues, and difficulty with anesthesia. I discussed that he would probably only need 1 week off for the laparoscopy but that he cannot do heavy lifting for 2 weeks.  He was advised this is an outpatient procedure. I did discuss with the patient that if his cancer continues to progress, then he may not be a candidate for this surgery later. I also advised that the more that  he gets chemotherapy, the increased risk of difficulty with liver failure.

## 2013-05-23 NOTE — Progress Notes (Signed)
Chief Complaint  Patient presents with  . Other    HISTORY: The patient is a 53 year old male who has stage IV colon cancer.  Dr. Leonel Ramsay did a low anterior resection in August of 2013 for a T2 and T2 N1 rectal cancer. He has received numerous cycles of CAPOX/Avastin. Most recently, he has been on off of Xeloda.  He has had numerous liver metastases with mixed response. Most recently, the left-sided lesion is involuted and calcified. His right-sided lesions appear to be getting larger. The patient states that he pain this is because he wasn't eating correctly for a while.  He also attributes this to contaminating his Xeloda by placing it in the refrigerator.  He did not want to come to discuss surgery for a while.  It took several efforts to convince him to come discuss liver surgery with me.  He does have some vague abdominal pain. He thinks that he can feel the tumor getting smaller and larger depending on the amount of pain that he has.  Past Medical History  Diagnosis Date  . Hyperlipidemia   . Insomnia   . Anemia   . Chills   . Unexplained weight loss   . Cough   . Constipation     hx of  . 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 Colonoscopy unable to assess the proximal colon. CT scan suggests extraluminal spread and multiple liver metastasis. CEA level 80.6. Liver function test normal.   . Metastasis to liver 12/19/11    Past Surgical History  Procedure Laterality Date  . Gingivectomy  2006  . Bowel resection  12/22/2011    Procedure: LOW ANTERIOR BOWEL RESECTION;  Surgeon: Adin Hector, MD;  Location: [facility];  Service: General;  Laterality: N/A;  . Liver biopsy  12/22/2011    Procedure: LIVER BIOPSY;  Surgeon: Adin Hector, MD;  Location: [facility];  Service: General;  Laterality: N/A;  wedge  . Portacath placement  01/10/2012    Procedure: INSERTION PORT-A-CATH;  Surgeon: Adin Hector, MD;  Location: [place];  Service: General;   Laterality: N/A;    Current Outpatient Prescriptions  Medication Sig Dispense Refill  . Biotin 5000 MCG TABS Take 1 tablet by mouth daily.      . capecitabine (XELODA) 500 MG tablet TAKE 2 TABLETS (1000MG ) ORALLY TWICE A DAY FOR 14 DAYS, THEN OFF FOR 7DAYS, THEN REPEAT. TAKE WITHIN 30 MIN AFTER A MEAL WITH WATER.  56 tablet  1  . citalopram (CELEXA) 20 MG tablet Take 20 mg by mouth daily.      . Glucos-Chondroit-Hyaluron-MSM (GLUCOSAMINE CHONDROITIN JOINT PO) Take 1-2 tablets by mouth daily. 750/600--will take breaks      . Lecithin 1200 MG CAPS Take 1-2 capsules by mouth daily.      Marland Kitchen LORazepam (ATIVAN) 0.5 MG tablet Take 0.5 mg by mouth every 8 (eight) hours. Usually takes 1-2/ week for sleep      . milk thistle 175 MG tablet Take 175 mg by mouth daily.      . Multiple Vitamin (MULTIVITAMIN WITH MINERALS) TABS Take 1 tablet by mouth daily. 1/2 tab daily      . NON FORMULARY Take 1 capsule by mouth daily. Sour Sop--capsule--OTC Anti-cancer action-nutritional supplement      . omega-3 acid ethyl esters (LOVAZA) 1 G capsule Take 1-2 g by mouth daily. "Nature Made"      . ondansetron (ZOFRAN) 8 MG tablet Take 1 tablet (  8 mg total) by mouth every 12 (twelve) hours as needed for nausea (if needed).  20 tablet  0  . prochlorperazine (COMPAZINE) 10 MG tablet Take 10 mg by mouth every 6 (six) hours as needed.      Marland Kitchen QUEtiapine (SEROQUEL) 25 MG tablet Take 25 mg by mouth at bedtime. Patient estimates taking 15mg  daily      . Travoprost, BAK Free, (TRAVATAN) 0.004 % SOLN ophthalmic solution Place 1 drop into the left eye at bedtime.       No current facility-administered medications for this visit.   Facility-Administered Medications Ordered in Other Visits  Medication Dose Route Frequency Provider Last Rate Last Dose  . sodium chloride 0.9 % injection 10 mL  10 mL Intravenous PRN Ladell Pier, MD   10 mL at 10/01/12 1627     No Known Allergies   Family History  Problem Relation Age of  Onset  . Heart attack Paternal Grandfather      History   Social History  . Marital Status: Divorced    Spouse Name: N/A    Number of Children: N/A  . Years of Education: N/A   Social History Main Topics  . Smoking status: Never Smoker   . Smokeless tobacco: Never Used  . Alcohol Use: No  . Drug Use: No  . Sexual Activity: None    REVIEW OF SYSTEMS - PERTINENT POSITIVES ONLY: 12 point review of systems negative other than HPI and PMH except for vague abd pain.    EXAM: Filed Vitals:   05/22/13 0917  BP: 124/81  Pulse: 80  Temp: 98.6 F (37 C)  Resp: 16   Filed Weights   05/22/13 0917  Weight: 213 lb 12.8 oz (96.979 kg)     Gen:  No acute distress.  Well nourished and well groomed.   Neurological: Alert and oriented to person, place, and time. Coordination normal.  Head: Normocephalic and atraumatic.  Eyes: Conjunctivae are normal. Pupils are equal, round, and reactive to light. No scleral icterus.  Neck: Normal range of motion. Neck supple. No tracheal deviation or thyromegaly present.  Cardiovascular: Normal rate, regular rhythm, normal heart sounds and intact distal pulses.  Exam reveals no gallop and no friction rub.  No murmur heard. Respiratory: Effort normal.  No respiratory distress. No chest wall tenderness. Breath sounds normal.  No wheezes, rales or rhonchi.  GI: Soft. Bowel sounds are normal. The abdomen is soft and nontender.  There is no rebound and no guarding.  Musculoskeletal: Normal range of motion. Extremities are nontender.  Lymphadenopathy: No cervical, preauricular, postauricular or axillary adenopathy is present Skin: Skin is warm and dry. No rash noted. No diaphoresis. No erythema. No pallor. No clubbing, cyanosis, or edema.   Psychiatric: Normal mood and affect. Behavior is normal. Judgment and thought content normal.    LABORATORY RESULTS: Available labs are reviewed  CBC HCT 34.8, CEA 7.9 (increased from 5.6),  CMET with TBili 1.5, AST  42, otherwise essentially normal.    RADIOLOGY RESULTS: See E-Chart or I-Site for most recent results.  Images and reports are reviewed.  CT FINDINGS: Multiple liver metastases are again seen. 2 metastatic lesions in the liver dome show increase in size, measuring 3.8 cm on image 11 compared to 2.1 cm previously, and 5.3 cm on image 13 compared to 2.6 cm previously partially calcified metastases in both the right and left hepatic lobes remains stable several sub cm noncalcified metastatic lesions in the right hepatic lobe show  mild decrease in size. Representative index lesion in the dome of the right hepatic lobe measures 1.6 cm on image 8 compared to 2.1 cm previously. Small hypervascular lesion in the posterior right hepatic lobe on image 16 is stable and likely benign.    CT abd/pelvis IMPRESSION: 1. A single lesion within the left hepatic lobe (segment 4A). This lesion is stable compared, calcified and has diminished peripheral enhancement compared to the enlarging lesions in the right hepatic lobe. 2. Enlarging peripherally enhancing lesions in the right hepatic lobe. 3. Replaced right hepatic artery originating from the SMA. 4. Replaced left lateral segment hepatic artery originating from the left gastric.    ASSESSMENT AND PLAN: Liver metastases for colon adenocarcinoma Patient appears to have predominantly right-sided disease. There does not appear to be any active malignancy on the left side.  He has definitely had at least one lesion on the left in the past. This appears to have involuted and calcified. There was a suggestion of an additional lesion in the far left lateral lobe, but this also is not visible on the past several scans.  The patient is not excited about having a large hepatic resection. He is very concerned about the time off work. What we discussed, is proceeding with a diagnostic laparoscopy with a liver ultrasound. If he did have occult  carcinomatosis or cold left-sided disease, I would not perform a right hepatectomy on them. If we knew this up front, and this may affect his treatment course. He may proceed with Y. 90 earlier.  If we were going to do a right hepatic resection, I would ask interventional radiology to perform right portal vein embolization. This will allow some significant left-sided hypertrophy. His left side is quite small, and I do not think his future liver remnant that would be adequate considering the amount of chemotherapy that he has received. He does not have any frank signs of cirrhosis, but he has received chemotherapy for a period of time.  This does predispose patients to significant steatosis and increased surgical complications.  I reviewed the risk of diagnostic laparoscopy with ultrasound. The main risk are bleeding, ultrasound, damage to adjacent structures, finding carcinomatosis, and other standard surgical complications such as pneumonia, cardiac issues, and difficulty with anesthesia. I discussed that he would probably only need 1 week off for the laparoscopy but that he cannot do heavy lifting for 2 weeks.  He was advised this is an outpatient procedure. I did discuss with the patient that if his cancer continues to progress, then he may not be a candidate for this surgery later. I also advised that the more that he gets chemotherapy, the increased risk of difficulty with liver failure.  I discussed with Dr. Benay Spice holding his Avastin for the next treatment.  Patient also expresses significant conviction that he will improve his cancer by changing his diet and exercising.  Milus Height MD Surgical Oncology, General and [facility]      Visit Diagnoses: 1. Liver metastases for colon adenocarcinoma     Primary Care Physician: Abigail Miyamoto, MD

## 2013-05-26 NOTE — Progress Notes (Signed)
Need orders in EPIC.  Surgery scheduled for 06/10/13.  Thank You.  

## 2013-05-27 ENCOUNTER — Other Ambulatory Visit (HOSPITAL_BASED_OUTPATIENT_CLINIC_OR_DEPARTMENT_OTHER): Payer: Federal, State, Local not specified - PPO

## 2013-05-27 ENCOUNTER — Telehealth: Payer: Self-pay | Admitting: Oncology

## 2013-05-27 ENCOUNTER — Ambulatory Visit: Payer: Federal, State, Local not specified - PPO

## 2013-05-27 ENCOUNTER — Other Ambulatory Visit: Payer: Self-pay | Admitting: *Deleted

## 2013-05-27 ENCOUNTER — Ambulatory Visit (HOSPITAL_BASED_OUTPATIENT_CLINIC_OR_DEPARTMENT_OTHER): Payer: Federal, State, Local not specified - PPO | Admitting: Oncology

## 2013-05-27 VITALS — BP 149/88 | HR 65 | Temp 97.4°F | Resp 20 | Ht 72.0 in | Wt 213.5 lb

## 2013-05-27 DIAGNOSIS — L270 Generalized skin eruption due to drugs and medicaments taken internally: Secondary | ICD-10-CM

## 2013-05-27 DIAGNOSIS — C187 Malignant neoplasm of sigmoid colon: Secondary | ICD-10-CM

## 2013-05-27 DIAGNOSIS — T451X5A Adverse effect of antineoplastic and immunosuppressive drugs, initial encounter: Secondary | ICD-10-CM

## 2013-05-27 DIAGNOSIS — C19 Malignant neoplasm of rectosigmoid junction: Secondary | ICD-10-CM

## 2013-05-27 DIAGNOSIS — R109 Unspecified abdominal pain: Secondary | ICD-10-CM

## 2013-05-27 DIAGNOSIS — C787 Secondary malignant neoplasm of liver and intrahepatic bile duct: Secondary | ICD-10-CM

## 2013-05-27 LAB — COMPREHENSIVE METABOLIC PANEL (CC13)
ALT: 64 U/L — ABNORMAL HIGH (ref 0–55)
ANION GAP: 12 meq/L — AB (ref 3–11)
AST: 68 U/L — ABNORMAL HIGH (ref 5–34)
Albumin: 3.8 g/dL (ref 3.5–5.0)
Alkaline Phosphatase: 80 U/L (ref 40–150)
BILIRUBIN TOTAL: 1.5 mg/dL — AB (ref 0.20–1.20)
BUN: 17.7 mg/dL (ref 7.0–26.0)
CO2: 20 meq/L — AB (ref 22–29)
Calcium: 9.4 mg/dL (ref 8.4–10.4)
Chloride: 107 mEq/L (ref 98–109)
Creatinine: 1.1 mg/dL (ref 0.7–1.3)
GLUCOSE: 139 mg/dL (ref 70–140)
Potassium: 4.5 mEq/L (ref 3.5–5.1)
SODIUM: 140 meq/L (ref 136–145)
TOTAL PROTEIN: 7.1 g/dL (ref 6.4–8.3)

## 2013-05-27 LAB — CBC WITH DIFFERENTIAL/PLATELET
BASO%: 1 % (ref 0.0–2.0)
Basophils Absolute: 0.1 10*3/uL (ref 0.0–0.1)
EOS ABS: 0.2 10*3/uL (ref 0.0–0.5)
EOS%: 2.7 % (ref 0.0–7.0)
HCT: 47.5 % (ref 38.4–49.9)
HEMOGLOBIN: 16.4 g/dL (ref 13.0–17.1)
LYMPH%: 25.1 % (ref 14.0–49.0)
MCH: 35.5 pg — ABNORMAL HIGH (ref 27.2–33.4)
MCHC: 34.4 g/dL (ref 32.0–36.0)
MCV: 103.3 fL — ABNORMAL HIGH (ref 79.3–98.0)
MONO#: 0.7 10*3/uL (ref 0.1–0.9)
MONO%: 12 % (ref 0.0–14.0)
NEUT%: 59.2 % (ref 39.0–75.0)
NEUTROS ABS: 3.3 10*3/uL (ref 1.5–6.5)
PLATELETS: 192 10*3/uL (ref 140–400)
RBC: 4.6 10*6/uL (ref 4.20–5.82)
RDW: 15.4 % — ABNORMAL HIGH (ref 11.0–14.6)
WBC: 5.6 10*3/uL (ref 4.0–10.3)
lymph#: 1.4 10*3/uL (ref 0.9–3.3)

## 2013-05-27 LAB — UA PROTEIN, DIPSTICK - CHCC: PROTEIN: NEGATIVE mg/dL

## 2013-05-27 LAB — CEA: CEA: 10.6 ng/mL — AB (ref 0.0–5.0)

## 2013-05-27 NOTE — Telephone Encounter (Signed)
gv adn printed appt sched anda vs for pt for Feb °

## 2013-05-27 NOTE — Progress Notes (Addendum)
   [facility]    OFFICE PROGRESS NOTE   INTERVAL HISTORY:   He returns for scheduled followup of metastatic colon cancer. He completed another cycle of Xeloda/Avastin beginning 05/06/2013. Mild discomfort at the hands and feet. No mouth sores or diarrhea. Mild upper abdominal pain.  He saw Dr. Barry Dienes and is scheduled to undergo a diagnostic laparoscopy on 06/10/2013. Dr. Barry Dienes is considering a hepatic resection.  Objective:  Vital signs in last 24 hours:  Blood pressure 149/88, pulse 65, temperature 97.4 F (36.3 C), temperature source Oral, resp. rate 20, height 6' (1.829 m), weight 213 lb 8 oz (96.843 kg), SpO2 100.00%.    HEENT: No thrush or ulcers Resp: Lungs clear bilaterally Cardio: Regular rate and rhythm GI: No hepatomegaly, mild tenderness in the right upper abdomen Vascular: No leg edema  Skin: Skin thickening and mild erythema at the palms and soles with callus formation. No skin breakdown   Portacath/PICC-without erythema  Lab Results:  Lab Results  Component Value Date   WBC 5.6 05/27/2013   HGB 16.4 05/27/2013   HCT 47.5 05/27/2013   MCV 103.3* 05/27/2013   PLT 192 05/27/2013   NEUTROABS 3.3 05/27/2013      Medications: I have reviewed the patient's current medications.  Assessment/Plan: 1. Metastatic colon cancer status post low anterior resection on 12/22/2011 (T4, N2, M1). K-ras wild-type (extended K-ras testing with no K-ras, NRAS, or BRAF mutation identified). Initiation of CAPOX/Avastin on 01/23/2012. The CEA was in normal range on 05/28/2012. Restaging CT 04/12/2012 after 4 cycles of CAPOX/Avastin confirmed improvement in the hepatic metastases. He completed cycle 7 CAPOX/Avastin beginning 06/18/2012. He completed cycle 8 systemic therapy with Xeloda and Avastin (oxaliplatin discontinued) beginning 07/09/2012. He completed cycle 9 beginning 07/30/2012 and cycle 10 beginning on 08/20/2012. Restaging CT evaluation 08/13/2012 with further  improvement in liver metastases. He began another cycle of Xeloda on 09/10/2012. He declined further Avastin. He completed a cycle of dose reduced Xeloda beginning on 10/01/2012 and was then maintained off of systemic therapy. Systemic therapy resumed with Xeloda/Avastin 12/31/2012 (completed 3 days of Xeloda with cycle 1). The Xeloda was dose reduced with the cycle of chemotherapy beginning 04/01/2013 Restaging CT 04/14/2013, compared to 08/13/2012 revealed a mixed response in the liver  MRI of the abdomen 05/02/2013 confirmed a single calcified left hepatic lobe lesion and multiple peripherally enhancing right hepatic lobe lesions 2. Microcytic anemia-resolved. 3. History of Weight loss secondary to metastatic colon cancer.  4. Status post Port-A-Cath placement 01/10/2012. 5. Hand-foot syndrome secondary to Xeloda. The Xeloda was dose reduced to 1000 mg twice daily beginning with cycle 4 CAPOX. The hand-foot symptoms progressed. The Xeloda was further dose reduced beginning with treatment on 04/01/2013. He is now back on the 1000 mg twice daily Xeloda dose. 6. Mild neutropenia 07/09/2012.  7. Glaucoma-now on medical therapy.   Disposition:  He appears stable. Avastin will be placed on hold. He will complete another cycle of Xeloda beginning today. He will discontinue Xeloda on 06/08/2013. He is scheduled for a diagnostic laparoscopy 06/10/2013. Dr. Barry Dienes will decide on additional evaluation (PET scan, interventional radiology referral) based on the laparoscopy findings. [name] will return for an office visit on 06/17/2013. We will make a decision on further systemic therapy based on the laparoscopy findings.   Betsy Coder, MD  05/27/2013  9:16 AM

## 2013-05-28 ENCOUNTER — Telehealth: Payer: Self-pay | Admitting: *Deleted

## 2013-05-28 NOTE — Telephone Encounter (Signed)
Message copied by Norma Fredrickson on Wed May 28, 2013  3:29 PM ------      Message from: Tania Ade      Created: Wed May 28, 2013  2:46 PM                   ----- Message -----         From: Ladell Pier, MD         Sent: 05/27/2013   9:17 PM           To: Tania Ade, RN, Ludwig Lean, RN, #            Please call patient, cea is slightly higher, continue xeloda, f/u as scheduled ------

## 2013-05-28 NOTE — Progress Notes (Signed)
Surgery scheduled for 06/10/13.  Preop on 06/06/13 at 230pm.  Need orders in EPIC.  Thank You.

## 2013-05-28 NOTE — Telephone Encounter (Signed)
Called and informed patient of slightly higher cea, to continue Xeloda as prescribed, and to follow up as scheduled.  Per Dr. Benay Spice.  Patient verbalized understanding

## 2013-06-02 ENCOUNTER — Other Ambulatory Visit (INDEPENDENT_AMBULATORY_CARE_PROVIDER_SITE_OTHER): Payer: Self-pay | Admitting: General Surgery

## 2013-06-04 ENCOUNTER — Encounter (HOSPITAL_COMMUNITY): Payer: Self-pay | Admitting: Pharmacy Technician

## 2013-06-05 NOTE — Patient Instructions (Addendum)
[place]  06/05/2013   Your procedure is scheduled on: Tuesday January 27th  Report to [facility] at 530 am  Call this number if you have problems the morning of surgery [phone]   Remember:   Do not eat food or drink liquids :After Midnight.     Take these medicines the morning of surgery with A SIP OF WATER: no meds to take                                [place] may not have any metal on your body including hair pins and piercings  Do not wear jewelry, make-up.  Do not wear lotions, powders, or perfumes. You may wear deodorant.   Men may shave face and neck.  Do not bring valuables to the hospital. [place].  Contacts, dentures or bridgework may not be worn into surgery.  Leave suitcase in the car. After surgery it may be brought to your room.  For patients admitted to the hospital, checkout time is 11:00 AM the day of discharge.   Patients discharged the day of surgery will not be allowed to drive home.  Name and phone number of your driver:michelle Pandey mother cell [phone]  Special Instructions: N/A  Please read over the following fact sheets that you were given: [facility] Preparing for surgery sheet, blood fact sheet  Call Zelphia Cairo RN pre op nurse if needed [phone]    [place].  PATIENT SIGNATURE___________________________________________  NURSE SIGNATURE_____________________________________________

## 2013-06-06 ENCOUNTER — Encounter (HOSPITAL_COMMUNITY)
Admission: RE | Admit: 2013-06-06 | Discharge: 2013-06-06 | Disposition: A | Discharge status: Home / Self Care | Payer: Federal, State, Local not specified - PPO | Source: Ambulatory Visit | Attending: General Surgery | Admitting: General Surgery

## 2013-06-06 ENCOUNTER — Ambulatory Visit (INDEPENDENT_AMBULATORY_CARE_PROVIDER_SITE_OTHER): Payer: Federal, State, Local not specified - PPO | Admitting: General Surgery

## 2013-06-06 ENCOUNTER — Encounter (HOSPITAL_COMMUNITY): Payer: Self-pay

## 2013-06-06 DIAGNOSIS — Z01812 Encounter for preprocedural laboratory examination: Secondary | ICD-10-CM | POA: Insufficient documentation

## 2013-06-06 HISTORY — DX: Unspecified glaucoma: H40.9

## 2013-06-06 LAB — PROTIME-INR
INR: 0.97 (ref 0.00–1.49)
PROTHROMBIN TIME: 12.7 s (ref 11.6–15.2)

## 2013-06-06 LAB — APTT: aPTT: 27 seconds (ref 24–37)

## 2013-06-06 NOTE — Progress Notes (Signed)
Cbc with dif, cmet 05-17-13 epic

## 2013-06-10 ENCOUNTER — Encounter (HOSPITAL_COMMUNITY): Payer: Self-pay | Admitting: *Deleted

## 2013-06-10 ENCOUNTER — Encounter (HOSPITAL_COMMUNITY)
Admission: RE | Disposition: A | Discharge status: Home / Self Care | Payer: Self-pay | Source: Ambulatory Visit | Attending: General Surgery

## 2013-06-10 ENCOUNTER — Ambulatory Visit (HOSPITAL_COMMUNITY)
Admission: RE | Admit: 2013-06-10 | Discharge: 2013-06-10 | Disposition: A | Discharge status: Home / Self Care | Payer: Federal, State, Local not specified - PPO | Source: Ambulatory Visit | Attending: General Surgery | Admitting: General Surgery

## 2013-06-10 ENCOUNTER — Ambulatory Visit (HOSPITAL_COMMUNITY): Payer: Federal, State, Local not specified - PPO | Admitting: Anesthesiology

## 2013-06-10 ENCOUNTER — Encounter (HOSPITAL_COMMUNITY): Payer: Federal, State, Local not specified - PPO | Admitting: Anesthesiology

## 2013-06-10 DIAGNOSIS — C786 Secondary malignant neoplasm of retroperitoneum and peritoneum: Secondary | ICD-10-CM

## 2013-06-10 DIAGNOSIS — K660 Peritoneal adhesions (postprocedural) (postinfection): Secondary | ICD-10-CM | POA: Insufficient documentation

## 2013-06-10 DIAGNOSIS — C20 Malignant neoplasm of rectum: Secondary | ICD-10-CM | POA: Insufficient documentation

## 2013-06-10 DIAGNOSIS — K7689 Other specified diseases of liver: Secondary | ICD-10-CM | POA: Insufficient documentation

## 2013-06-10 DIAGNOSIS — C787 Secondary malignant neoplasm of liver and intrahepatic bile duct: Secondary | ICD-10-CM | POA: Insufficient documentation

## 2013-06-10 DIAGNOSIS — E785 Hyperlipidemia, unspecified: Secondary | ICD-10-CM | POA: Insufficient documentation

## 2013-06-10 DIAGNOSIS — G4700 Insomnia, unspecified: Secondary | ICD-10-CM | POA: Insufficient documentation

## 2013-06-10 DIAGNOSIS — Z79899 Other long term (current) drug therapy: Secondary | ICD-10-CM | POA: Insufficient documentation

## 2013-06-10 HISTORY — PX: LAPAROSCOPY: SHX197

## 2013-06-10 LAB — TYPE AND SCREEN
ABO/RH(D): A POS
ANTIBODY SCREEN: NEGATIVE

## 2013-06-10 SURGERY — LAPAROSCOPY, DIAGNOSTIC
Anesthesia: General

## 2013-06-10 SURGERY — LAPAROSCOPY, DIAGNOSTIC
Anesthesia: General | Site: Abdomen

## 2013-06-10 MED ORDER — NEOSTIGMINE METHYLSULFATE 1 MG/ML IJ SOLN
INTRAMUSCULAR | Status: AC
  Filled 2013-06-10: qty 10

## 2013-06-10 MED ORDER — OXYCODONE-ACETAMINOPHEN 5-325 MG PO TABS: 1.0000 | ORAL_TABLET | ORAL | Status: DC | PRN

## 2013-06-10 MED ORDER — ONDANSETRON HCL 4 MG/2ML IJ SOLN: 4.0000 mg | Freq: Four times a day (QID) | INTRAMUSCULAR | Status: DC | PRN

## 2013-06-10 MED ORDER — NEOSTIGMINE METHYLSULFATE 1 MG/ML IJ SOLN
INTRAMUSCULAR | Status: DC | PRN
  Administered 2013-06-10: 4 mg via INTRAVENOUS

## 2013-06-10 MED ORDER — FENTANYL CITRATE 0.05 MG/ML IJ SOLN
INTRAMUSCULAR | Status: AC
  Filled 2013-06-10: qty 5

## 2013-06-10 MED ORDER — MEPERIDINE HCL 50 MG/ML IJ SOLN
6.2500 mg | INTRAMUSCULAR | Status: DC | PRN
Start: 2013-06-10 — End: 2013-06-10

## 2013-06-10 MED ORDER — GLYCOPYRROLATE 0.2 MG/ML IJ SOLN
INTRAMUSCULAR | Status: AC
  Filled 2013-06-10: qty 1

## 2013-06-10 MED ORDER — LACTATED RINGERS IV SOLN: INTRAVENOUS | Status: DC

## 2013-06-10 MED ORDER — PROPOFOL 10 MG/ML IV BOLUS
INTRAVENOUS | Status: DC | PRN
  Administered 2013-06-10: 150 mg via INTRAVENOUS

## 2013-06-10 MED ORDER — SUCCINYLCHOLINE CHLORIDE 20 MG/ML IJ SOLN
INTRAMUSCULAR | Status: DC | PRN
  Administered 2013-06-10: 100 mg via INTRAVENOUS

## 2013-06-10 MED ORDER — DEXTROSE 5 % IV SOLN
INTRAVENOUS | Status: AC
  Filled 2013-06-10 (×2): qty 1

## 2013-06-10 MED ORDER — BUPIVACAINE-EPINEPHRINE PF 0.25-1:200000 % IJ SOLN
INTRAMUSCULAR | Status: AC
  Filled 2013-06-10: qty 30

## 2013-06-10 MED ORDER — BUPIVACAINE-EPINEPHRINE 0.25% -1:200000 IJ SOLN
INTRAMUSCULAR | Status: DC | PRN
  Administered 2013-06-10: 2.5 mL

## 2013-06-10 MED ORDER — ACETAMINOPHEN 650 MG RE SUPP
650.0000 mg | RECTAL | Status: DC | PRN
  Filled 2013-06-10: qty 1

## 2013-06-10 MED ORDER — FENTANYL CITRATE 0.05 MG/ML IJ SOLN
INTRAMUSCULAR | Status: DC | PRN
  Administered 2013-06-10 (×2): 50 ug via INTRAVENOUS
  Administered 2013-06-10: 100 ug via INTRAVENOUS
  Administered 2013-06-10: 50 ug via INTRAVENOUS

## 2013-06-10 MED ORDER — ONDANSETRON HCL 4 MG/2ML IJ SOLN
INTRAMUSCULAR | Status: AC
  Filled 2013-06-10: qty 2

## 2013-06-10 MED ORDER — ONDANSETRON HCL 4 MG/2ML IJ SOLN
INTRAMUSCULAR | Status: DC | PRN
  Administered 2013-06-10: 4 mg via INTRAVENOUS

## 2013-06-10 MED ORDER — GLYCOPYRROLATE 0.2 MG/ML IJ SOLN
INTRAMUSCULAR | Status: AC
  Filled 2013-06-10: qty 3

## 2013-06-10 MED ORDER — DEXAMETHASONE SODIUM PHOSPHATE 10 MG/ML IJ SOLN
INTRAMUSCULAR | Status: AC
  Filled 2013-06-10: qty 1

## 2013-06-10 MED ORDER — OXYCODONE HCL 5 MG PO TABS: 5.0000 mg | ORAL_TABLET | ORAL | Status: DC | PRN

## 2013-06-10 MED ORDER — DEXTROSE 5 % IV SOLN
2.0000 g | INTRAVENOUS | Status: AC
  Administered 2013-06-10: 2 g via INTRAVENOUS
  Filled 2013-06-10: qty 2

## 2013-06-10 MED ORDER — PROPOFOL 10 MG/ML IV BOLUS
INTRAVENOUS | Status: AC
  Filled 2013-06-10: qty 20

## 2013-06-10 MED ORDER — CISATRACURIUM BESYLATE (PF) 10 MG/5ML IV SOLN
INTRAVENOUS | Status: DC | PRN
  Administered 2013-06-10: 2 mg via INTRAVENOUS
  Administered 2013-06-10: 6 mg via INTRAVENOUS

## 2013-06-10 MED ORDER — LACTATED RINGERS IV SOLN
INTRAVENOUS | Status: DC | PRN
  Administered 2013-06-10: 07:00:00 via INTRAVENOUS

## 2013-06-10 MED ORDER — ACETAMINOPHEN 325 MG PO TABS: 650.0000 mg | ORAL_TABLET | ORAL | Status: DC | PRN

## 2013-06-10 MED ORDER — SODIUM CHLORIDE 0.9 % IJ SOLN: 3.0000 mL | Freq: Two times a day (BID) | INTRAMUSCULAR | Status: DC

## 2013-06-10 MED ORDER — SODIUM CHLORIDE 0.9 % IV SOLN: 250.0000 mL | INTRAVENOUS | Status: DC | PRN

## 2013-06-10 MED ORDER — GLYCOPYRROLATE 0.2 MG/ML IJ SOLN
INTRAMUSCULAR | Status: DC | PRN
  Administered 2013-06-10: .8 mg via INTRAVENOUS

## 2013-06-10 MED ORDER — LIDOCAINE HCL (PF) 1 % IJ SOLN
INTRAMUSCULAR | Status: DC | PRN
  Administered 2013-06-10: 2.5 mL via SUBCUTANEOUS

## 2013-06-10 MED ORDER — HYDROMORPHONE HCL PF 1 MG/ML IJ SOLN
0.2500 mg | INTRAMUSCULAR | Status: DC | PRN
Start: 2013-06-10 — End: 2013-06-10

## 2013-06-10 MED ORDER — DEXAMETHASONE SODIUM PHOSPHATE 10 MG/ML IJ SOLN
INTRAMUSCULAR | Status: DC | PRN
  Administered 2013-06-10: 10 mg via INTRAVENOUS

## 2013-06-10 MED ORDER — LIDOCAINE HCL 1 % IJ SOLN
INTRAMUSCULAR | Status: AC
  Filled 2013-06-10: qty 20

## 2013-06-10 MED ORDER — CISATRACURIUM BESYLATE 20 MG/10ML IV SOLN
INTRAVENOUS | Status: AC
  Filled 2013-06-10: qty 10

## 2013-06-10 MED ORDER — SODIUM CHLORIDE 0.9 % IJ SOLN: 3.0000 mL | INTRAMUSCULAR | Status: DC | PRN

## 2013-06-10 MED ORDER — LACTATED RINGERS IR SOLN
Status: DC | PRN
  Administered 2013-06-10: 1000 mL

## 2013-06-10 MED ORDER — 0.9 % SODIUM CHLORIDE (POUR BTL) OPTIME
TOPICAL | Status: DC | PRN
  Administered 2013-06-10: 1000 mL

## 2013-06-10 MED ORDER — PROMETHAZINE HCL 25 MG/ML IJ SOLN: 6.2500 mg | INTRAMUSCULAR | Status: DC | PRN

## 2013-06-10 SURGICAL SUPPLY — 47 items
ADH SKN CLS APL DERMABOND .7 (GAUZE/BANDAGES/DRESSINGS) ×2
APL SKNCLS STERI-STRIP NONHPOA (GAUZE/BANDAGES/DRESSINGS)
BENZOIN TINCTURE PRP APPL 2/3 (GAUZE/BANDAGES/DRESSINGS) IMPLANT
CANISTER SUCTION 2500CC (MISCELLANEOUS) ×4 IMPLANT
CLOSURE WOUND 1/2 X4 (GAUZE/BANDAGES/DRESSINGS)
DECANTER SPIKE VIAL GLASS SM (MISCELLANEOUS) IMPLANT
DERMABOND ADVANCED (GAUZE/BANDAGES/DRESSINGS) ×2
DERMABOND ADVANCED .7 DNX12 (GAUZE/BANDAGES/DRESSINGS) ×1 IMPLANT
DRAPE LAPAROSCOPIC ABDOMINAL (DRAPES) ×4 IMPLANT
DRAPE TOWEL STR TPT 18X26 WHT (DRAPES) ×2 IMPLANT
ELECT REM PT RETURN 9FT ADLT (ELECTROSURGICAL) ×4
ELECTRODE REM PT RTRN 9FT ADLT (ELECTROSURGICAL) ×2 IMPLANT
GLOVE BIO SURGEON STRL SZ 6 (GLOVE) ×4 IMPLANT
GLOVE BIO SURGEON STRL SZ7 (GLOVE) ×3 IMPLANT
GLOVE BIOGEL PI IND STRL 6.5 (GLOVE) ×1 IMPLANT
GLOVE BIOGEL PI IND STRL 7.0 (GLOVE) ×1 IMPLANT
GLOVE BIOGEL PI INDICATOR 6.5 (GLOVE) ×2
GLOVE BIOGEL PI INDICATOR 7.0 (GLOVE) ×2
GLOVE INDICATOR 6.5 STRL GRN (GLOVE) ×8 IMPLANT
GLOVE SURG SS PI 7.0 STRL IVOR (GLOVE) ×3 IMPLANT
GOWN STRL REIN 2XL LVL4 (GOWN DISPOSABLE) ×4 IMPLANT
GOWN STRL REUS W/ TWL XL LVL3 (GOWN DISPOSABLE) ×1 IMPLANT
GOWN STRL REUS W/TWL 2XL LVL3 (GOWN DISPOSABLE) ×10 IMPLANT
GOWN STRL REUS W/TWL XL LVL3 (GOWN DISPOSABLE) ×4
HEMOSTAT SNOW SURGICEL 2X4 (HEMOSTASIS) ×3 IMPLANT
INSTRUMENT FULL CORE 16GA 15CM (MISCELLANEOUS) ×3 IMPLANT
KIT BASIN OR (CUSTOM PROCEDURE TRAY) ×4 IMPLANT
PAD TELFA 2X3 NADH STRL (GAUZE/BANDAGES/DRESSINGS) ×3 IMPLANT
PENCIL BUTTON HOLSTER BLD 10FT (ELECTRODE) ×3 IMPLANT
SCALPEL HARMONIC ACE (MISCELLANEOUS) IMPLANT
SCISSORS LAP 5X35 DISP (ENDOMECHANICALS) ×3 IMPLANT
SET IRRIG TUBING LAPAROSCOPIC (IRRIGATION / IRRIGATOR) IMPLANT
SOLUTION ANTI FOG 6CC (MISCELLANEOUS) ×4 IMPLANT
STRIP CLOSURE SKIN 1/2X4 (GAUZE/BANDAGES/DRESSINGS) IMPLANT
SUT MNCRL AB 4-0 PS2 18 (SUTURE) ×3 IMPLANT
SUT VIC AB 4-0 PS2 27 (SUTURE) IMPLANT
TOWEL OR 17X26 10 PK STRL BLUE (TOWEL DISPOSABLE) ×12 IMPLANT
TOWEL OR NON WOVEN STRL DISP B (DISPOSABLE) ×3 IMPLANT
TRAY FOLEY CATH 14FRSI W/METER (CATHETERS) IMPLANT
TRAY LAP CHOLE (CUSTOM PROCEDURE TRAY) ×4 IMPLANT
TROCAR BLADELESS OPT 5 75 (ENDOMECHANICALS) ×3 IMPLANT
TROCAR XCEL BLADELESS 5X75MML (TROCAR) ×3 IMPLANT
TROCAR XCEL BLUNT TIP 100MML (ENDOMECHANICALS) ×4 IMPLANT
TROCAR XCEL NON-BLD 11X100MML (ENDOMECHANICALS) IMPLANT
TROCAR XCEL UNIV SLVE 11M 100M (ENDOMECHANICALS) IMPLANT
TUBING INSUFFLATION 10FT LAP (TUBING) ×4 IMPLANT
WATER STERILE IRR 1500ML POUR (IV SOLUTION) ×4 IMPLANT

## 2013-06-10 NOTE — Op Note (Signed)
PRE-OPERATIVE DIAGNOSIS: stage IV rectal cancer  POST-OPERATIVE DIAGNOSIS:  Same  PROCEDURE:  Procedure(s): diagnostic laparoscopy with intraoperative liver ultrasound, liver biopsies, lysis of adhesions for 20 min  SURGEON:  Surgeon(s): Stark Klein, MD  ANESTHESIA:   general  DRAINS: none   LOCAL MEDICATIONS USED:  BUPIVICAINE  and XYLOCAINE   SPECIMEN:  Source of Specimen:  segment 2 anterior liver biopsy, segment 3 posterior liver biopsy, right hemidiaphragm biopsy  DISPOSITION OF SPECIMEN:  PATHOLOGY  COUNTS:  YES  DICTATION: .Dragon Dictation  PLAN OF CARE: Discharge to home after PACU  PATIENT DISPOSITION:  PACU - hemodynamically stable.  FINDINGS:  Adhesions to midline.  Large right liver mass on dome.  Peritoneal nodules near right liver mass.  Small firm areas in left liver.  Indistinct, biopsies sent for permanent.  Small left liver.    EBL :  Min  Procedure:  Patient was identified in the holding area and taken to the operating room where he was placed in the operating room table. General endotracheal anesthesia was induced. His abdomen was prepped and draped in sterile fashion. Timeout was performed according to the surgical safety checklist. When all was correct we continued.  The supraumbilical skin was anesthetized with local, and a midline incision was made just above his previous incision. The cutaneous tissues were spread with a Claiborne Billings. 2 covers were placed on the fascia and this was elevated. An incision was made in the midline. The 0-0 Vicryl pursestring suture was placed around the fascial incision. The Hasson was attempted to be placed in the abdomen, but there were 2 many adhesions to be able to see. A 5 mm Optiview trocar was placed in the left upper quadrant after placing the patient in reverse Trendelenburg position and rotating the table away..   Once this was performed there were numerous omental adhesions to the abdominal wall. A second trocar was  placed in the left upper quadrant under direct visualization. The omental adhesions to the midline were taken down sharply. The Sheryle Hail was then able to be inserted in the umbilical incision. The laparoscopic ultrasound probe was then used to segmentally evaluate the liver. On the right, there were numerous lesions are seen. The largest was on the dome and was superficial. There was an additional very large one on the right side as well that was several centimeters away.  On the left, there was a small firm area anteriorly in segment 2 that was hypoechoic. This was biopsied with the biopsy gun.  A smaller lesion was seen in the posterior left hepatic lobe in segment 3.  This was attempted to be biopsied but was very indistinct in the left lateral segment.  The suspicion was lower for this lesion.  Hemostasis was achieved with the cautery. No hemostatic agent was placed on the posterior segment 3 biopsy site. The remainder the abdomen was examined. There was a peritoneal nodule seen over the right-sided liver lesion. This was biopsied. There was not evidence of diffuse carcinomatosis.  Pneumoperitoneum was allowed to evacuate. The pursestring of the umbilical incision was closed. There was no residual palpable fascial defect. The skin of the incisions was then closed with 4-0 Monocryl and dressed with Dermabond. The patient was awakened from anesthesia and taken to the PACU in stable condition. Needle, sponge, and instrument counts were correct x2.

## 2013-06-10 NOTE — Anesthesia Preprocedure Evaluation (Addendum)
Anesthesia Evaluation  Patient identified by MRN, date of birth, ID band Patient awake    Reviewed: Allergy & Precautions, H&P , NPO status , Patient's Chart, lab work & pertinent test results  History of Anesthesia Complications Negative for: history of anesthetic complications  Airway Mallampati: I TM Distance: >3 FB Neck ROM: Full    Dental  (+) Teeth Intact and Dental Advisory Given   Pulmonary neg pulmonary ROS,  breath sounds clear to auscultation  Pulmonary exam normal       Cardiovascular Exercise Tolerance: Good negative cardio ROS  Rhythm:Regular Rate:Normal     Neuro/Psych negative neurological ROS  negative psych ROS   GI/Hepatic negative GI ROS, Neg liver ROS,   Endo/Other  negative endocrine ROS  Renal/GU negative Renal ROS  negative genitourinary   Musculoskeletal negative musculoskeletal ROS (+)   Abdominal (+)  Abdomen: soft.    Peds  Hematology Blood loss anemia   Anesthesia Other Findings   Reproductive/Obstetrics                          Anesthesia Physical  Anesthesia Plan  ASA: II  Anesthesia Plan: General   Post-op Pain Management:    Induction: Intravenous  Airway Management Planned: Oral ETT  Additional Equipment:   Intra-op Plan:   Post-operative Plan: Extubation in OR  Informed Consent:   Dental advisory given  Plan Discussed with: CRNA and Surgeon  Anesthesia Plan Comments:         Anesthesia Quick Evaluation

## 2013-06-10 NOTE — Preoperative (Signed)
Beta Blockers   Reason not to administer Beta Blockers:Not Applicable 

## 2013-06-10 NOTE — H&P (View-Only) (Signed)
Chief Complaint  Patient presents with  . Other    HISTORY: The patient is a 53 year old male who has stage IV colon cancer.  Dr. Leonel Ramsay did a low anterior resection in August of 2013 for a T2 and T2 N1 rectal cancer. He has received numerous cycles of CAPOX/Avastin. Most recently, he has been on off of Xeloda.  He has had numerous liver metastases with mixed response. Most recently, the left-sided lesion is involuted and calcified. His right-sided lesions appear to be getting larger. The patient states that he pain this is because he wasn't eating correctly for a while.  He also attributes this to contaminating his Xeloda by placing it in the refrigerator.  He did not want to come to discuss surgery for a while.  It took several efforts to convince him to come discuss liver surgery with me.  He does have some vague abdominal pain. He thinks that he can feel the tumor getting smaller and larger depending on the amount of pain that he has.  Past Medical History  Diagnosis Date  . Hyperlipidemia   . Insomnia   . Anemia   . Chills   . Unexplained weight loss   . Cough   . Constipation     hx of  . 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 Colonoscopy unable to assess the proximal colon. CT scan suggests extraluminal spread and multiple liver metastasis. CEA level 80.6. Liver function test normal.   . Metastasis to liver 12/19/11    Past Surgical History  Procedure Laterality Date  . Gingivectomy  2006  . Bowel resection  12/22/2011    Procedure: LOW ANTERIOR BOWEL RESECTION;  Surgeon: Adin Hector, MD;  Location: [facility];  Service: General;  Laterality: N/A;  . Liver biopsy  12/22/2011    Procedure: LIVER BIOPSY;  Surgeon: Adin Hector, MD;  Location: [facility];  Service: General;  Laterality: N/A;  wedge  . Portacath placement  01/10/2012    Procedure: INSERTION PORT-A-CATH;  Surgeon: Adin Hector, MD;  Location: [facility];  Service: General;   Laterality: N/A;    Current Outpatient Prescriptions  Medication Sig Dispense Refill  . Biotin 5000 MCG TABS Take 1 tablet by mouth daily.      . capecitabine (XELODA) 500 MG tablet TAKE 2 TABLETS (1000MG ) ORALLY TWICE A DAY FOR 14 DAYS, THEN OFF FOR 7DAYS, THEN REPEAT. TAKE WITHIN 30 MIN AFTER A MEAL WITH WATER.  56 tablet  1  . citalopram (CELEXA) 20 MG tablet Take 20 mg by mouth daily.      . Glucos-Chondroit-Hyaluron-MSM (GLUCOSAMINE CHONDROITIN JOINT PO) Take 1-2 tablets by mouth daily. 750/600--will take breaks      . Lecithin 1200 MG CAPS Take 1-2 capsules by mouth daily.      Marland Kitchen LORazepam (ATIVAN) 0.5 MG tablet Take 0.5 mg by mouth every 8 (eight) hours. Usually takes 1-2/ week for sleep      . milk thistle 175 MG tablet Take 175 mg by mouth daily.      . Multiple Vitamin (MULTIVITAMIN WITH MINERALS) TABS Take 1 tablet by mouth daily. 1/2 tab daily      . NON FORMULARY Take 1 capsule by mouth daily. Sour Sop--capsule--OTC Anti-cancer action-nutritional supplement      . omega-3 acid ethyl esters (LOVAZA) 1 G capsule Take 1-2 g by mouth daily. "Nature Made"      . ondansetron (ZOFRAN) 8 MG tablet Take 1 tablet (  8 mg total) by mouth every 12 (twelve) hours as needed for nausea (if needed).  20 tablet  0  . prochlorperazine (COMPAZINE) 10 MG tablet Take 10 mg by mouth every 6 (six) hours as needed.      Marland Kitchen QUEtiapine (SEROQUEL) 25 MG tablet Take 25 mg by mouth at bedtime. Patient estimates taking 15mg  daily      . Travoprost, BAK Free, (TRAVATAN) 0.004 % SOLN ophthalmic solution Place 1 drop into the left eye at bedtime.       No current facility-administered medications for this visit.   Facility-Administered Medications Ordered in Other Visits  Medication Dose Route Frequency Provider Last Rate Last Dose  . sodium chloride 0.9 % injection 10 mL  10 mL Intravenous PRN Ladell Pier, MD   10 mL at 10/01/12 1627     No Known Allergies   Family History  Problem Relation Age of  Onset  . Heart attack Paternal Grandfather      History   Social History  . Marital Status: Divorced    Spouse Name: N/A    Number of Children: N/A  . Years of Education: N/A   Social History Main Topics  . Smoking status: Never Smoker   . Smokeless tobacco: Never Used  . Alcohol Use: No  . Drug Use: No  . Sexual Activity: None    REVIEW OF SYSTEMS - PERTINENT POSITIVES ONLY: 12 point review of systems negative other than HPI and PMH except for vague abd pain.    EXAM: Filed Vitals:   05/22/13 0917  BP: 124/81  Pulse: 80  Temp: 98.6 F (37 C)  Resp: 16   Filed Weights   05/22/13 0917  Weight: 213 lb 12.8 oz (96.979 kg)     Gen:  No acute distress.  Well nourished and well groomed.   Neurological: Alert and oriented to person, place, and time. Coordination normal.  Head: Normocephalic and atraumatic.  Eyes: Conjunctivae are normal. Pupils are equal, round, and reactive to light. No scleral icterus.  Neck: Normal range of motion. Neck supple. No tracheal deviation or thyromegaly present.  Cardiovascular: Normal rate, regular rhythm, normal heart sounds and intact distal pulses.  Exam reveals no gallop and no friction rub.  No murmur heard. Respiratory: Effort normal.  No respiratory distress. No chest wall tenderness. Breath sounds normal.  No wheezes, rales or rhonchi.  GI: Soft. Bowel sounds are normal. The abdomen is soft and nontender.  There is no rebound and no guarding.  Musculoskeletal: Normal range of motion. Extremities are nontender.  Lymphadenopathy: No cervical, preauricular, postauricular or axillary adenopathy is present Skin: Skin is warm and dry. No rash noted. No diaphoresis. No erythema. No pallor. No clubbing, cyanosis, or edema.   Psychiatric: Normal mood and affect. Behavior is normal. Judgment and thought content normal.    LABORATORY RESULTS: Available labs are reviewed  CBC HCT 34.8, CEA 7.9 (increased from 5.6),  CMET with TBili 1.5, AST  42, otherwise essentially normal.    RADIOLOGY RESULTS: See E-Chart or I-Site for most recent results.  Images and reports are reviewed.  CT FINDINGS: Multiple liver metastases are again seen. 2 metastatic lesions in the liver dome show increase in size, measuring 3.8 cm on image 11 compared to 2.1 cm previously, and 5.3 cm on image 13 compared to 2.6 cm previously partially calcified metastases in both the right and left hepatic lobes remains stable several sub cm noncalcified metastatic lesions in the right hepatic lobe show  mild decrease in size. Representative index lesion in the dome of the right hepatic lobe measures 1.6 cm on image 8 compared to 2.1 cm previously. Small hypervascular lesion in the posterior right hepatic lobe on image 16 is stable and likely benign.    CT abd/pelvis IMPRESSION: 1. A single lesion within the left hepatic lobe (segment 4A). This lesion is stable compared, calcified and has diminished peripheral enhancement compared to the enlarging lesions in the right hepatic lobe. 2. Enlarging peripherally enhancing lesions in the right hepatic lobe. 3. Replaced right hepatic artery originating from the SMA. 4. Replaced left lateral segment hepatic artery originating from the left gastric.    ASSESSMENT AND PLAN: Liver metastases for colon adenocarcinoma Patient appears to have predominantly right-sided disease. There does not appear to be any active malignancy on the left side.  He has definitely had at least one lesion on the left in the past. This appears to have involuted and calcified. There was a suggestion of an additional lesion in the far left lateral lobe, but this also is not visible on the past several scans.  The patient is not excited about having a large hepatic resection. He is very concerned about the time off work. What we discussed, is proceeding with a diagnostic laparoscopy with a liver ultrasound. If he did have occult  carcinomatosis or cold left-sided disease, I would not perform a right hepatectomy on them. If we knew this up front, and this may affect his treatment course. He may proceed with Y. 90 earlier.  If we were going to do a right hepatic resection, I would ask interventional radiology to perform right portal vein embolization. This will allow some significant left-sided hypertrophy. His left side is quite small, and I do not think his future liver remnant that would be adequate considering the amount of chemotherapy that he has received. He does not have any frank signs of cirrhosis, but he has received chemotherapy for a period of time.  This does predispose patients to significant steatosis and increased surgical complications.  I reviewed the risk of diagnostic laparoscopy with ultrasound. The main risk are bleeding, ultrasound, damage to adjacent structures, finding carcinomatosis, and other standard surgical complications such as pneumonia, cardiac issues, and difficulty with anesthesia. I discussed that he would probably only need 1 week off for the laparoscopy but that he cannot do heavy lifting for 2 weeks.  He was advised this is an outpatient procedure. I did discuss with the patient that if his cancer continues to progress, then he may not be a candidate for this surgery later. I also advised that the more that he gets chemotherapy, the increased risk of difficulty with liver failure.  I discussed with Dr. Benay Spice holding his Avastin for the next treatment.  Patient also expresses significant conviction that he will improve his cancer by changing his diet and exercising.  Milus Height MD Surgical Oncology, General and [facility]      Visit Diagnoses: 1. Liver metastases for colon adenocarcinoma     Primary Care Physician: Abigail Miyamoto, MD

## 2013-06-10 NOTE — Interval H&P Note (Signed)
History and Physical Interval Note:  06/10/2013 7:22 AM  Lawrence Villa  has presented today for surgery, with the diagnosis of stage iv colon cancer  The various methods of treatment have been discussed with the patient and family. After consideration of risks, benefits and other options for treatment, the patient has consented to  Procedure(s): LAPAROSCOPY DIAGNOSTIC (N/A) LIVER ULTRASOUND (N/A) as a surgical intervention .  The patient's history has been reviewed, patient examined, no change in status, stable for surgery.  I have reviewed the patient's chart and labs.  Questions were answered to the patient's satisfaction.     [name]

## 2013-06-10 NOTE — Transfer of Care (Signed)
Immediate Anesthesia Transfer of Care Note  Patient: Lawrence Villa  Procedure(s) Performed: Procedure(s): LAPAROSCOPY DIAGNOSTIC WITH LIVER ULTRASOUND AND LIVER BIOPSY (N/A)  Patient Location: PACU  Anesthesia Type:General  Level of Consciousness: awake, alert , oriented and patient cooperative  Airway & Oxygen Therapy: Patient Spontanous Breathing and Patient connected to face mask oxygen  Post-op Assessment: Report given to PACU RN and Post -op Vital signs reviewed and stable  Post vital signs: Reviewed and stable  Complications: No apparent anesthesia complications

## 2013-06-10 NOTE — Anesthesia Postprocedure Evaluation (Signed)
  Anesthesia Post-op Note  Patient: Lawrence Villa  Procedure(s) Performed: Procedure(s) (LRB): LAPAROSCOPY DIAGNOSTIC WITH LIVER ULTRASOUND AND LIVER BIOPSY (N/A)  Patient Location: PACU  Anesthesia Type: General  Level of Consciousness: awake and alert   Airway and Oxygen Therapy: Patient Spontanous Breathing  Post-op Pain: mild  Post-op Assessment: Post-op Vital signs reviewed, Patient's Cardiovascular Status Stable, Respiratory Function Stable, Patent Airway and No signs of Nausea or vomiting  Last Vitals:  Filed Vitals:   06/10/13 1015  BP: 121/74  Pulse: 70  Temp: 36.4 C  Resp: 16    Post-op Vital Signs: stable   Complications: No apparent anesthesia complications

## 2013-06-10 NOTE — Discharge Instructions (Signed)
[place] Office Phone Number [phone]   POST OP INSTRUCTIONS  Always review your discharge instruction sheet given to you by the facility where your surgery was performed.  IF YOU HAVE DISABILITY OR FAMILY LEAVE FORMS, YOU MUST BRING THEM TO THE OFFICE FOR PROCESSING.  DO NOT GIVE THEM TO YOUR DOCTOR.  1. A prescription for pain medication may be given to you upon discharge.  Take your pain medication as prescribed, if needed.  If narcotic pain medicine is not needed, then you may take acetaminophen (Tylenol) or ibuprofen (Advil) as needed. 2. Take your usually prescribed medications unless otherwise directed 3. If you need a refill on your pain medication, please contact your pharmacy.  They will contact our office to request authorization.  Prescriptions will not be filled after 5pm or on week-ends. 4. You should eat very light the first 24 hours after surgery, such as soup, crackers, pudding, etc.  Resume your normal diet the day after surgery 5. It is common to experience some constipation if taking pain medication after surgery.  Increasing fluid intake and taking a stool softener will usually help or prevent this problem from occurring.  A mild laxative (Milk of Magnesia or Miralax) should be taken according to package directions if there are no bowel movements after 48 hours. 6. You may shower in 48 hours.  The surgical glue will flake off in 2-3 weeks.   7. ACTIVITIES:  No strenuous activity or heavy lifting for 2 weeks.   a. You may drive when you no longer are taking prescription pain medication, you can comfortably wear a seatbelt, and you can safely maneuver your car and apply brakes. b. RETURN TO WORK:  ________as tolerated with no heavy lifting for 2 weeks.______________ Dennis Bast should see your doctor in the office for a follow-up appointment approximately three-four weeks after your surgery.    WHEN TO CALL YOUR DOCTOR: 1. Fever over 101.0 2. Nausea and/or  vomiting. 3. Extreme swelling or bruising. 4. Continued bleeding from incision. 5. Increased pain, redness, or drainage from the incision.  The clinic staff is available to answer your questions during regular business hours.  Please dont hesitate to call and ask to speak to one of the nurses for clinical concerns.  If you have a medical emergency, go to the nearest emergency room or call 911.  A surgeon from [facility] is always on call at the hospital.  For further questions, please visit [email or web address]

## 2013-06-11 ENCOUNTER — Encounter (HOSPITAL_COMMUNITY): Payer: Self-pay | Admitting: General Surgery

## 2013-06-17 ENCOUNTER — Ambulatory Visit (HOSPITAL_BASED_OUTPATIENT_CLINIC_OR_DEPARTMENT_OTHER): Payer: Federal, State, Local not specified - PPO | Admitting: Nurse Practitioner

## 2013-06-17 ENCOUNTER — Other Ambulatory Visit (HOSPITAL_BASED_OUTPATIENT_CLINIC_OR_DEPARTMENT_OTHER): Payer: Federal, State, Local not specified - PPO

## 2013-06-17 ENCOUNTER — Telehealth: Payer: Self-pay | Admitting: Oncology

## 2013-06-17 ENCOUNTER — Encounter (INDEPENDENT_AMBULATORY_CARE_PROVIDER_SITE_OTHER): Payer: Self-pay

## 2013-06-17 VITALS — BP 126/87 | HR 62 | Temp 97.6°F | Resp 18 | Ht 72.0 in | Wt 213.9 lb

## 2013-06-17 DIAGNOSIS — C187 Malignant neoplasm of sigmoid colon: Secondary | ICD-10-CM

## 2013-06-17 DIAGNOSIS — C787 Secondary malignant neoplasm of liver and intrahepatic bile duct: Secondary | ICD-10-CM

## 2013-06-17 DIAGNOSIS — C19 Malignant neoplasm of rectosigmoid junction: Secondary | ICD-10-CM

## 2013-06-17 DIAGNOSIS — L270 Generalized skin eruption due to drugs and medicaments taken internally: Secondary | ICD-10-CM

## 2013-06-17 LAB — CBC WITH DIFFERENTIAL/PLATELET
BASO%: 0.9 % (ref 0.0–2.0)
BASOS ABS: 0.1 10*3/uL (ref 0.0–0.1)
EOS ABS: 0.2 10*3/uL (ref 0.0–0.5)
EOS%: 2.7 % (ref 0.0–7.0)
HCT: 47.5 % (ref 38.4–49.9)
HEMOGLOBIN: 16.1 g/dL (ref 13.0–17.1)
LYMPH%: 26.5 % (ref 14.0–49.0)
MCH: 34.5 pg — ABNORMAL HIGH (ref 27.2–33.4)
MCHC: 33.9 g/dL (ref 32.0–36.0)
MCV: 101.9 fL — ABNORMAL HIGH (ref 79.3–98.0)
MONO#: 1 10*3/uL — AB (ref 0.1–0.9)
MONO%: 11.1 % (ref 0.0–14.0)
NEUT%: 58.8 % (ref 39.0–75.0)
NEUTROS ABS: 5.3 10*3/uL (ref 1.5–6.5)
Platelets: 209 10*3/uL (ref 140–400)
RBC: 4.66 10*6/uL (ref 4.20–5.82)
RDW: 14.7 % — AB (ref 11.0–14.6)
WBC: 9.1 10*3/uL (ref 4.0–10.3)
lymph#: 2.4 10*3/uL (ref 0.9–3.3)

## 2013-06-17 NOTE — Progress Notes (Addendum)
OFFICE PROGRESS NOTE  Interval history:  Lawrence Villa returns for followup of metastatic colon cancer. He reports discontinuing Xeloda 9 or 10 days ago due to progressive hand-foot symptoms with redness and peeling skin. No associated pain. He has noted minimal improvement since discontinuation. He denies mouth sores. No nausea or vomiting. No diarrhea. He denies abdominal pain. No bleeding. No shortness of breath or chest pain. No leg swelling or calf pain.  He underwent a diagnostic laparoscopy by Dr. Barry Dienes on 06/10/2013 with findings of adhesions to midline; large right liver mass on dome; peritoneal nodules near right liver mass; small firm areas in left liver; small left liver. 2 biopsies were obtained from the left liver and a peritoneal nodule seen over the right sided liver lesion was also biopsied.   Objective: Filed Vitals:   06/17/13 1548  BP: 126/87  Pulse: 62  Temp: 97.6 F (36.4 C)  Resp: 18   Oropharynx is without thrush or ulceration. Lungs are clear. Regular cardiac rhythm. Port-A-Cath site without erythema. Abdomen soft and nontender. Mildly distended. No hepatomegaly. No leg edema. Palms erythematous with thickened skin. Soles with mild erythema and thickened skin.   Lab Results: Lab Results  Component Value Date   WBC 9.1 06/17/2013   HGB 16.1 06/17/2013   HCT 47.5 06/17/2013   MCV 101.9* 06/17/2013   PLT 209 06/17/2013   NEUTROABS 5.3 06/17/2013    Chemistry:    Chemistry      Component Value Date/Time   NA 140 05/27/2013 0849   NA 140 03/05/2012 0924   K 4.5 05/27/2013 0849   K 4.2 03/05/2012 0924   CL 107 10/01/2012 1525   CL 106 03/05/2012 0924   CO2 20* 05/27/2013 0849   CO2 25 03/05/2012 0924   BUN 17.7 05/27/2013 0849   BUN 19 03/05/2012 0924   CREATININE 1.1 05/27/2013 0849   CREATININE 0.81 03/05/2012 0924      Component Value Date/Time   CALCIUM 9.4 05/27/2013 0849   CALCIUM 9.4 03/05/2012 0924   ALKPHOS 80 05/27/2013 0849   ALKPHOS 57 03/05/2012 0924   AST 68* 05/27/2013 0849   AST 30 03/05/2012 0924   ALT 64* 05/27/2013 0849   ALT 24 03/05/2012 0924   BILITOT 1.50* 05/27/2013 0849   BILITOT 1.2 03/05/2012 0924       Studies/Results: No results found.  Medications: I have reviewed the patient's current medications.  Assessment/Plan: 1. Metastatic colon cancer status post low anterior resection on 12/22/2011 (T4, N2, M1). K-ras wild-type (extended K-ras testing with no K-ras, NRAS, or BRAF mutation identified). Initiation of CAPOX/Avastin on 01/23/2012. The CEA was in normal range on 05/28/2012. Restaging CT 04/12/2012 after 4 cycles of CAPOX/Avastin confirmed improvement in the hepatic metastases. He completed cycle 7 CAPOX/Avastin beginning 06/18/2012. He completed cycle 8 systemic therapy with Xeloda and Avastin (oxaliplatin discontinued) beginning 07/09/2012. He completed cycle 9 beginning 07/30/2012 and cycle 10 beginning on 08/20/2012. Restaging CT evaluation 08/13/2012 with further improvement in liver metastases. He began another cycle of Xeloda on 09/10/2012. He declined further Avastin. He completed a cycle of dose reduced Xeloda beginning on 10/01/2012 and was then maintained off of systemic therapy. Systemic therapy resumed with Xeloda/Avastin 12/31/2012 (completed 3 days of Xeloda with cycle 1). The Xeloda was dose reduced with the cycle of chemotherapy beginning 04/01/2013 Restaging CT 04/14/2013, compared to 08/13/2012 revealed a mixed response in the liver  MRI of the abdomen 05/02/2013 confirmed a single calcified left hepatic lobe lesion and multiple peripherally  enhancing right hepatic lobe lesions. Diagnostic laparoscopy 06/10/2013 with findings of adhesions to midline; numerous lesions seen on the right liver with the largest on the dome; peritoneal nodules near right liver mass; small firm areas in left liver; small left liver. 2 biopsies were obtained from the left liver and a peritoneal nodule seen over the right sided liver  lesion was also biopsied. 2. Microcytic anemia-resolved. 3. History of weight loss secondary to metastatic colon cancer.  4. Status post Port-A-Cath placement 01/10/2012. 5. Hand-foot syndrome secondary to Xeloda. The Xeloda was dose reduced to 1000 mg twice daily beginning with cycle 4 CAPOX. The hand-foot symptoms progressed. The Xeloda was further dose reduced beginning with treatment on 04/01/2013. Most recent Xeloda dose 1000 mg twice daily. 6. Mild neutropenia 07/09/2012.  7. Glaucoma-now on medical therapy.  Dispositon-he appears stable. Pathology is pending from the recent diagnostic laparoscopy. He has a followup visit with Dr. Barry Dienes later this week.  He has progressive hand-foot syndrome. Dr. Benay Spice recommends placing Xeloda on hold. We will continue to hold Avastin as well pending a decision regarding surgery.  He will return in 3 weeks for a followup visit.   Patient seen with Dr. Benay Spice. 25 minutes were spent face-to-face at today's visit with the majority of the time involved in counseling/coordination of care.   Lawrence Villa ANP/GNP-BC    This was a shared visit with Lawrence Villa.  We reviewed the operative findings with Lawrence Villa. The pathology from the left liver and peritoneal biopsies is pending. His case will be presented at the GI tumor conference 06/18/2013.  The CEA was higher on 05/27/2013 and he has significant hand/foot syndrome.   Lawrence Villa will discontinue Xeloda and return for an office visit in 3 weeks. We will discuss systemic therapy options based on the decision for hepatic surgery.  Lawrence Villa, M.D.

## 2013-06-17 NOTE — Telephone Encounter (Signed)
Gave pt appt for lab and MD for February 2015 °

## 2013-06-18 LAB — CEA: CEA: 15.5 ng/mL — ABNORMAL HIGH (ref 0.0–5.0)

## 2013-06-20 ENCOUNTER — Ambulatory Visit (INDEPENDENT_AMBULATORY_CARE_PROVIDER_SITE_OTHER): Payer: Federal, State, Local not specified - PPO | Admitting: General Surgery

## 2013-06-20 ENCOUNTER — Encounter (INDEPENDENT_AMBULATORY_CARE_PROVIDER_SITE_OTHER): Payer: Self-pay | Admitting: General Surgery

## 2013-06-20 VITALS — BP 128/78 | HR 68 | Temp 98.2°F | Resp 16 | Ht 72.0 in | Wt 208.4 lb

## 2013-06-20 DIAGNOSIS — C19 Malignant neoplasm of rectosigmoid junction: Secondary | ICD-10-CM

## 2013-06-20 NOTE — Patient Instructions (Signed)
Follow up with Dr. Benay Spice next week. He will determine when to send you back to see me.

## 2013-06-20 NOTE — Assessment & Plan Note (Signed)
Pt with increasing cea and peritoneal nodule positive for adenocarcinoma. Think unlikely to cure patient with surgery. Recommend proceeding with chemo, then probably Y90.  I am happy to see back to revisit the question of right portal vein embolization followed by right hepatectomy.

## 2013-06-20 NOTE — Progress Notes (Signed)
HISTORY: Patient is a 53 year old male who is status post diagnostic laparoscopy and liver ultrasound with biopsies for stage IV rectal cancer. He underwent evaluation with a lesions to see if he is a candidate for right hepatectomy. He did have a peritoneal nodule that was found to be adenocarcinoma. This appeared to be localized to the right hemidiaphragm very close to one of his metastatic deposits. He did well from surgery. He was back on his feet very quickly. He is not taking pain medication. He denies nausea or vomiting. He is back at work.    EXAM: General:  Alert and oriented x3 no distress  Incision:  Small amount of bruising and umbilical incision. No evidence of infection.   PATHOLOGY: 1. Liver, needle/core biopsy, anterior - BENIGN LIVER TISSUE WITH MILD STEATOSIS. - NO EVIDENCE OF MALIGNANCY. 2. Liver, wedge biopsy, posterior - BENIGN LIVER TISSUE. - NO EVIDENCE OF MALIGNANCY. 3. Peritoneum, biopsy, right hemi diaphragm - METASTATIC ADENOCARCINOMA WITH MUCINOUS FEATURES.   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 Think unlikely to cure patient with surgery. Recommend proceeding with chemo, then probably Y90.  I am happy to see back to revisit the question of right portal vein embolization followed by right hepatectomy.        Milus Height, MD Surgical Oncology, [facility]  Abigail Miyamoto, MD Abigail Miyamoto, MD

## 2013-06-24 ENCOUNTER — Telehealth: Payer: Self-pay | Admitting: *Deleted

## 2013-06-24 NOTE — Telephone Encounter (Signed)
Calling to ask if Dr. Benay Spice wants to see him today or 2/17 to get started on chemo. Would like to start before 2/24 if MD agrees.

## 2013-06-24 NOTE — Telephone Encounter (Signed)
Notified Lawrence Villa that Dr. Benay Spice wants to keep his appointment as scheduled on 2/24 to discuss new treatment plans.

## 2013-07-08 ENCOUNTER — Other Ambulatory Visit: Payer: Federal, State, Local not specified - PPO

## 2013-07-08 ENCOUNTER — Ambulatory Visit: Payer: Federal, State, Local not specified - PPO | Admitting: Nurse Practitioner

## 2013-07-08 ENCOUNTER — Telehealth: Payer: Self-pay | Admitting: *Deleted

## 2013-07-08 NOTE — Telephone Encounter (Signed)
Left VM to cancel his appointment today. Wants to reschedule for next Tuesday, 07/15/13. Scheduler notified.

## 2013-07-15 ENCOUNTER — Ambulatory Visit (HOSPITAL_BASED_OUTPATIENT_CLINIC_OR_DEPARTMENT_OTHER): Payer: Federal, State, Local not specified - PPO | Admitting: Nurse Practitioner

## 2013-07-15 ENCOUNTER — Telehealth: Payer: Self-pay | Admitting: Oncology

## 2013-07-15 ENCOUNTER — Other Ambulatory Visit (HOSPITAL_BASED_OUTPATIENT_CLINIC_OR_DEPARTMENT_OTHER): Payer: Federal, State, Local not specified - PPO

## 2013-07-15 VITALS — BP 121/83 | HR 64 | Temp 97.1°F | Resp 18 | Ht 72.0 in | Wt 210.1 lb

## 2013-07-15 DIAGNOSIS — C787 Secondary malignant neoplasm of liver and intrahepatic bile duct: Secondary | ICD-10-CM

## 2013-07-15 DIAGNOSIS — H409 Unspecified glaucoma: Secondary | ICD-10-CM

## 2013-07-15 DIAGNOSIS — C187 Malignant neoplasm of sigmoid colon: Secondary | ICD-10-CM

## 2013-07-15 DIAGNOSIS — L270 Generalized skin eruption due to drugs and medicaments taken internally: Secondary | ICD-10-CM

## 2013-07-15 DIAGNOSIS — C19 Malignant neoplasm of rectosigmoid junction: Secondary | ICD-10-CM

## 2013-07-15 LAB — CEA: CEA: 20.4 ng/mL — ABNORMAL HIGH (ref 0.0–5.0)

## 2013-07-15 MED ORDER — SODIUM CHLORIDE 0.9 % IJ SOLN
10.0000 mL | INTRAMUSCULAR | Status: DC | PRN
  Administered 2013-07-15: 10 mL via INTRAVENOUS
  Filled 2013-07-15: qty 10

## 2013-07-15 MED ORDER — HEPARIN SOD (PORK) LOCK FLUSH 100 UNIT/ML IV SOLN
500.0000 [IU] | Freq: Once | INTRAVENOUS | Status: AC
  Administered 2013-07-15: 500 [IU] via INTRAVENOUS
  Filled 2013-07-15: qty 5

## 2013-07-15 NOTE — Telephone Encounter (Signed)
gv and printed appt sched adn avs for pt for March....emailed MW to add tx.

## 2013-07-15 NOTE — Patient Instructions (Addendum)
Irinotecan injection What is this medicine? IRINOTECAN (ir in oh TEE kan ) is a chemotherapy drug. It is used to treat colon and rectal cancer. This medicine may be used for other purposes; ask your health care provider or pharmacist if you have questions. COMMON BRAND NAME(S): Camptosar What should I tell my health care provider before I take this medicine? They need to know if you have any of these conditions: -blood disorders -dehydration -diarrhea -infection (especially a virus infection such as chickenpox, cold sores, or herpes) -liver disease -low blood counts, like low white cell, platelet, or red cell counts -recent or ongoing radiation therapy -an unusual or allergic reaction to irinotecan, sorbitol, other chemotherapy, other medicines, foods, dyes, or preservatives -pregnant or trying to get pregnant -breast-feeding How should I use this medicine? This drug is given as an infusion into a vein. It is administered in a hospital or clinic by a specially trained health care professional. Talk to your pediatrician regarding the use of this medicine in children. Special care may be needed. Overdosage: If you think you have taken too much of this medicine contact a poison control center or emergency room at once. NOTE: This medicine is only for you. Do not share this medicine with others. What if I miss a dose? It is important not to miss your dose. Call your doctor or health care professional if you are unable to keep an appointment. What may interact with this medicine? Do not take this medicine with any of the following medications: -atazanavir -ketoconazole -St. John's Wort This medicine may also interact with the following medications: -dexamethasone -diuretics -laxatives -medicines for seizures like carbamazepine, mephobarbital, phenobarbital, phenytoin, primidone -medicines to increase blood counts like filgrastim, pegfilgrastim,  sargramostim -prochlorperazine -vaccines This list may not describe all possible interactions. Give your health care provider a list of all the medicines, herbs, non-prescription drugs, or dietary supplements you use. Also tell them if you smoke, drink alcohol, or use illegal drugs. Some items may interact with your medicine. What should I watch for while using this medicine? Your condition will be monitored carefully while you are receiving this medicine. You will need important blood work done while you are taking this medicine. This drug may make you feel generally unwell. This is not uncommon, as chemotherapy can affect healthy cells as well as cancer cells. Report any side effects. Continue your course of treatment even though you feel ill unless your doctor tells you to stop. In some cases, you may be given additional medicines to help with side effects. Follow all directions for their use. You may get drowsy or dizzy. Do not drive, use machinery, or do anything that needs mental alertness until you know how this medicine affects you. Do not stand or sit up quickly, especially if you are an older patient. This reduces the risk of dizzy or fainting spells. Call your doctor or health care professional for advice if you get a fever, chills or sore throat, or other symptoms of a cold or flu. Do not treat yourself. This drug decreases your body's ability to fight infections. Try to avoid being around people who are sick. This medicine may increase your risk to bruise or bleed. Call your doctor or health care professional if you notice any unusual bleeding. Be careful brushing and flossing your teeth or using a toothpick because you may get an infection or bleed more easily. If you have any dental work done, tell your dentist you are receiving this medicine. Avoid  taking products that contain aspirin, acetaminophen, ibuprofen, naproxen, or ketoprofen unless instructed by your doctor. These medicines may  hide a fever. Do not become pregnant while taking this medicine. Women should inform their doctor if they wish to become pregnant or think they might be pregnant. There is a potential for serious side effects to an unborn child. Talk to your health care professional or pharmacist for more information. Do not breast-feed an infant while taking this medicine. What side effects may I notice from receiving this medicine? Side effects that you should report to your doctor or health care professional as soon as possible: -allergic reactions like skin rash, itching or hives, swelling of the face, lips, or tongue -low blood counts - this medicine may decrease the number of white blood cells, red blood cells and platelets. You may be at increased risk for infections and bleeding. -signs of infection - fever or chills, cough, sore throat, pain or difficulty passing urine -signs of decreased platelets or bleeding - bruising, pinpoint red spots on the skin, black, tarry stools, blood in the urine -signs of decreased red blood cells - unusually weak or tired, fainting spells, lightheadedness -breathing problems -chest pain -diarrhea -feeling faint or lightheaded, falls -flushing, runny nose, sweating during infusion -mouth sores or pain -pain, swelling, redness or irritation where injected -pain, swelling, warmth in the leg -pain, tingling, numbness in the hands or feet -problems with balance, talking, walking -stomach cramps, pain -trouble passing urine or change in the amount of urine -vomiting as to be unable to hold down drinks or food -yellowing of the eyes or skin Side effects that usually do not require medical attention (report to your doctor or health care professional if they continue or are bothersome): -constipation -hair loss -headache -loss of appetite -nausea, vomiting -stomach upset This list may not describe all possible side effects. Call your doctor for medical advice about side  effects. You may report side effects to FDA at 1-800-FDA-1088. Where should I keep my medicine? This drug is given in a hospital or clinic and will not be stored at home. NOTE: This sheet is a summary. It may not cover all possible information. If you have questions about this medicine, talk to your doctor, pharmacist, or health care provider.  2014, Elsevier/Gold Standard. (2007-09-17 16:29:12) Implanted [facility] Guide An implanted port is a type of central line that is placed under the skin. Central lines are used to provide IV access when treatment or nutrition needs to be given through a person's veins. Implanted ports are used for long-term IV access. An implanted port may be placed because:   You need IV medicine that would be irritating to the small veins in your hands or arms.   You need long-term IV medicines, such as antibiotics.   You need IV nutrition for a long period.   You need frequent blood draws for lab tests.   You need dialysis.  Implanted ports are usually placed in the chest area, but they can also be placed in the upper arm, the abdomen, or the leg. An implanted port has two main parts:   Reservoir. The reservoir is round and will appear as a small, raised area under your skin. The reservoir is the part where a needle is inserted to give medicines or draw blood.   Catheter. The catheter is a thin, flexible tube that extends from the reservoir. The catheter is placed into a large vein. Medicine that is inserted into the reservoir goes  into the catheter and then into the vein.  HOW WILL I CARE FOR MY INCISION SITE? Do not get the incision site wet. Bathe or shower as directed by your health care provider.  HOW IS MY PORT ACCESSED? Special steps must be taken to access the port:   Before the port is accessed, a numbing cream can be placed on the skin. This helps numb the skin over the port site.   Your health care provider uses a sterile technique to access  the port.  Your health care provider must put on a mask and sterile gloves.  The skin over your port is cleaned carefully with an antiseptic and allowed to dry.  The port is gently pinched between sterile gloves, and a needle is inserted into the port.  Only "non-coring" port needles should be used to access the port. Once the port is accessed, a blood return should be checked. This helps ensure that the port is in the vein and is not clogged.   If your port needs to remain accessed for a constant infusion, a clear (transparent) bandage will be placed over the needle site. The bandage and needle will need to be changed every week, or as directed by your health care provider.   Keep the bandage covering the needle clean and dry. Do not get it wet. Follow your health care provider's instructions on how to take a shower or bath while the port is accessed.   If your port does not need to stay accessed, no bandage is needed over the port.  WHAT IS FLUSHING? Flushing helps keep the port from getting clogged. Follow your health care provider's instructions on how and when to flush the port. Ports are usually flushed with saline solution or a medicine called heparin. The need for flushing will depend on how the port is used.   If the port is used for intermittent medicines or blood draws, the port will need to be flushed:   After medicines have been given.   After blood has been drawn.   As part of routine maintenance.   If a constant infusion is running, the port may not need to be flushed.  HOW LONG WILL MY PORT STAY IMPLANTED? The port can stay in for as long as your health care provider thinks it is needed. When it is time for the port to come out, surgery will be done to remove it. The procedure is similar to the one performed when the port was put in.  WHEN SHOULD I SEEK IMMEDIATE MEDICAL CARE? When you have an implanted port, you should seek immediate medical care if:   You  notice a bad smell coming from the incision site.   You have swelling, redness, or drainage at the incision site.   You have more swelling or pain at the port site or the surrounding area.   You have a fever that is not controlled with medicine. Document Released: 05/01/2005 Document Revised: 02/19/2013 Document Reviewed: 01/06/2013 [facility] Patient Information 2014 [place].

## 2013-07-15 NOTE — Progress Notes (Addendum)
OFFICE PROGRESS NOTE  Interval history:  Lawrence Villa returns for followup of metastatic colon cancer. Hands and feet are better. He denies any hand or foot pain. He notes that the skin over the hands and feet is dark. He denies nausea/vomiting. No mouth sores. No diarrhea. He has occasional mild pain at the right abdomen.   Objective: Filed Vitals:   07/15/13 1443  BP: 121/83  Pulse: 64  Temp: 97.1 F (36.2 C)  Resp: 18   Oropharynx is without thrush or ulceration. Lungs are clear. Regular cardiac rhythm. Port-A-Cath site without erythema. Abdomen soft and nontender. No hepatomegaly. No mass. No leg edema. Palms and soles are hyperpigmented, mildly erythematous. No skin breakdown.   Lab Results: Lab Results  Component Value Date   WBC 9.1 06/17/2013   HGB 16.1 06/17/2013   HCT 47.5 06/17/2013   MCV 101.9* 06/17/2013   PLT 209 06/17/2013   NEUTROABS 5.3 06/17/2013    Chemistry:    Chemistry      Component Value Date/Time   NA 140 05/27/2013 0849   NA 140 03/05/2012 0924   K 4.5 05/27/2013 0849   K 4.2 03/05/2012 0924   CL 107 10/01/2012 1525   CL 106 03/05/2012 0924   CO2 20* 05/27/2013 0849   CO2 25 03/05/2012 0924   BUN 17.7 05/27/2013 0849   BUN 19 03/05/2012 0924   CREATININE 1.1 05/27/2013 0849   CREATININE 0.81 03/05/2012 0924      Component Value Date/Time   CALCIUM 9.4 05/27/2013 0849   CALCIUM 9.4 03/05/2012 0924   ALKPHOS 80 05/27/2013 0849   ALKPHOS 57 03/05/2012 0924   AST 68* 05/27/2013 0849   AST 30 03/05/2012 0924   ALT 64* 05/27/2013 0849   ALT 24 03/05/2012 0924   BILITOT 1.50* 05/27/2013 0849   BILITOT 1.2 03/05/2012 0924       Studies/Results: No results found.  Medications: I have reviewed the patient's current medications.  Assessment/Plan: 1. Metastatic colon cancer status post low anterior resection on 12/22/2011 (T4, N2, M1). K-ras wild-type (extended K-ras testing with no K-ras, NRAS, or BRAF mutation identified). Initiation of CAPOX/Avastin on  01/23/2012. The CEA was in normal range on 05/28/2012. Restaging CT 04/12/2012 after 4 cycles of CAPOX/Avastin confirmed improvement in the hepatic metastases. He completed cycle 7 CAPOX/Avastin beginning 06/18/2012. He completed cycle 8 systemic therapy with Xeloda and Avastin (oxaliplatin discontinued) beginning 07/09/2012. He completed cycle 9 beginning 07/30/2012 and cycle 10 beginning on 08/20/2012. Restaging CT evaluation 08/13/2012 with further improvement in liver metastases. He began another cycle of Xeloda on 09/10/2012. He declined further Avastin. He completed a cycle of dose reduced Xeloda beginning on 10/01/2012 and was then maintained off of systemic therapy. Systemic therapy resumed with Xeloda/Avastin 12/31/2012 (completed 3 days of Xeloda with cycle 1). The Xeloda was dose reduced with the cycle of chemotherapy beginning 04/01/2013 Restaging CT 04/14/2013, compared to 08/13/2012 revealed a mixed response in the liver  MRI of the abdomen 05/02/2013 confirmed a single calcified left hepatic lobe lesion and multiple peripherally enhancing right hepatic lobe lesions.  Diagnostic laparoscopy 06/10/2013 with findings of adhesions to midline; numerous lesions seen on the right liver with the largest on the dome; peritoneal nodules near right liver mass; small firm areas in left liver; small left liver. 2 biopsies were obtained from the left liver and a peritoneal nodule seen over the right sided liver lesion was also biopsied. Anterior liver core biopsy showed benign liver tissue with mild steatosis; no evidence  of malignancy. Posterior liver wedge biopsy showed benign liver tissue; no evidence of malignancy. 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 CEA 06/17/2013 increased at 15.5. 2. Microcytic anemia-resolved. 3. History of weight loss secondary to metastatic colon cancer.  4. Status post Port-A-Cath placement 01/10/2012. 5. Hand-foot  syndrome secondary to Xeloda. The Xeloda was dose reduced to 1000 mg twice daily beginning with cycle 4 CAPOX. The hand-foot symptoms progressed. The Xeloda was further dose reduced beginning with treatment on 04/01/2013. Most recent Xeloda dose 1000 mg twice daily. 6. Glaucoma-now on medical therapy.  Dispositon-he appears stable. Hand-foot syndrome is better. The CEA from 06/17/2013 was further increased. Dr. Benay Spice suspects the cancer is progressing and recommends discontinuing Xeloda and beginning FOLFIRI with continuation of Avastin. We reviewed potential toxicities associated with irinotecan including myelosuppression, nausea, hair loss, diarrhea (possibly severe). Lawrence Villa is agreeable.  He will return to begin FOLFIRI/Avastin on 07/22/2013. We will see him in followup prior to cycle 2 on 08/05/2013. He will contact the office in the interim with any problems.   Patient seen with Dr. Benay Spice. 25 minutes were spent face-to-face at today's visit with the majority of that time involving counseling/coordination of care.   [name] ANP/GNP-BC   This was a shared visit with [name]. Dr. Barry Dienes does not recommend surgery. The metastatic colon cancer appears to be progressing on the Xeloda/Avastin and he developed severe hand-foot syndrome from Xeloda. We decided to switch to FOLFIRI/Avastin. Lawrence Villa agrees.  Julieanne Manson, M.D.

## 2013-07-16 ENCOUNTER — Telehealth: Payer: Self-pay | Admitting: Oncology

## 2013-07-16 ENCOUNTER — Telehealth: Payer: Self-pay | Admitting: *Deleted

## 2013-07-16 NOTE — Telephone Encounter (Signed)
s.w. pt and advised on 3.10.15 appt time change....pt ok and aware.Marland KitchenMarland KitchenMarland Kitchen

## 2013-07-16 NOTE — Telephone Encounter (Signed)
Per staff message and POF I have scheduled appts.  Advised scheduler to move lab JMW  

## 2013-07-17 ENCOUNTER — Telehealth: Payer: Self-pay | Admitting: *Deleted

## 2013-07-17 NOTE — Telephone Encounter (Signed)
Message from pt requesting CEA result. Left results on voicemail as requested.

## 2013-07-18 ENCOUNTER — Other Ambulatory Visit: Payer: Self-pay | Admitting: Oncology

## 2013-07-21 ENCOUNTER — Telehealth: Payer: Self-pay | Admitting: *Deleted

## 2013-07-21 NOTE — Telephone Encounter (Signed)
Left VM requesting information regarding his insurance and "new chemo"-FOLFIRI on 3/10. Adds request to go ahead and work on authorization for next chemo treatment if MD decides to change regimen. Forwarded call to Dannielle Huh in managed care department.

## 2013-07-22 ENCOUNTER — Other Ambulatory Visit (HOSPITAL_BASED_OUTPATIENT_CLINIC_OR_DEPARTMENT_OTHER): Payer: Federal, State, Local not specified - PPO

## 2013-07-22 ENCOUNTER — Ambulatory Visit (HOSPITAL_BASED_OUTPATIENT_CLINIC_OR_DEPARTMENT_OTHER): Payer: Federal, State, Local not specified - PPO

## 2013-07-22 ENCOUNTER — Other Ambulatory Visit: Payer: Federal, State, Local not specified - PPO

## 2013-07-22 VITALS — BP 142/83 | HR 57 | Temp 97.4°F | Resp 17

## 2013-07-22 DIAGNOSIS — C19 Malignant neoplasm of rectosigmoid junction: Secondary | ICD-10-CM

## 2013-07-22 DIAGNOSIS — C187 Malignant neoplasm of sigmoid colon: Secondary | ICD-10-CM

## 2013-07-22 DIAGNOSIS — Z5111 Encounter for antineoplastic chemotherapy: Secondary | ICD-10-CM

## 2013-07-22 DIAGNOSIS — C787 Secondary malignant neoplasm of liver and intrahepatic bile duct: Secondary | ICD-10-CM

## 2013-07-22 DIAGNOSIS — Z5112 Encounter for antineoplastic immunotherapy: Secondary | ICD-10-CM

## 2013-07-22 LAB — UA PROTEIN, DIPSTICK - CHCC: PROTEIN: NEGATIVE mg/dL

## 2013-07-22 LAB — CBC WITH DIFFERENTIAL/PLATELET
BASO%: 0.9 % (ref 0.0–2.0)
Basophils Absolute: 0.1 10*3/uL (ref 0.0–0.1)
EOS ABS: 0.2 10*3/uL (ref 0.0–0.5)
EOS%: 2.6 % (ref 0.0–7.0)
HCT: 46.1 % (ref 38.4–49.9)
HGB: 15.9 g/dL (ref 13.0–17.1)
LYMPH%: 23.8 % (ref 14.0–49.0)
MCH: 34.2 pg — ABNORMAL HIGH (ref 27.2–33.4)
MCHC: 34.5 g/dL (ref 32.0–36.0)
MCV: 98.9 fL — ABNORMAL HIGH (ref 79.3–98.0)
MONO#: 0.7 10*3/uL (ref 0.1–0.9)
MONO%: 8.4 % (ref 0.0–14.0)
NEUT%: 64.3 % (ref 39.0–75.0)
NEUTROS ABS: 5 10*3/uL (ref 1.5–6.5)
Platelets: 227 10*3/uL (ref 140–400)
RBC: 4.66 10*6/uL (ref 4.20–5.82)
RDW: 13.2 % (ref 11.0–14.6)
WBC: 7.8 10*3/uL (ref 4.0–10.3)
lymph#: 1.9 10*3/uL (ref 0.9–3.3)

## 2013-07-22 LAB — COMPREHENSIVE METABOLIC PANEL (CC13)
ALK PHOS: 207 U/L — AB (ref 40–150)
ALT: 59 U/L — ABNORMAL HIGH (ref 0–55)
AST: 57 U/L — ABNORMAL HIGH (ref 5–34)
Albumin: 3.6 g/dL (ref 3.5–5.0)
Anion Gap: 10 mEq/L (ref 3–11)
BILIRUBIN TOTAL: 0.79 mg/dL (ref 0.20–1.20)
BUN: 18.2 mg/dL (ref 7.0–26.0)
CO2: 21 mEq/L — ABNORMAL LOW (ref 22–29)
Calcium: 9.5 mg/dL (ref 8.4–10.4)
Chloride: 109 mEq/L (ref 98–109)
Creatinine: 0.9 mg/dL (ref 0.7–1.3)
Glucose: 108 mg/dl (ref 70–140)
Potassium: 4.2 mEq/L (ref 3.5–5.1)
SODIUM: 140 meq/L (ref 136–145)
TOTAL PROTEIN: 7 g/dL (ref 6.4–8.3)

## 2013-07-22 LAB — CEA: CEA: 24 ng/mL — AB (ref 0.0–5.0)

## 2013-07-22 MED ORDER — HEPARIN SOD (PORK) LOCK FLUSH 100 UNIT/ML IV SOLN
500.0000 [IU] | Freq: Once | INTRAVENOUS | Status: AC | PRN
  Administered 2013-07-22: 500 [IU]
  Filled 2013-07-22: qty 5

## 2013-07-22 MED ORDER — SODIUM CHLORIDE 0.9 % IV SOLN
Freq: Once | INTRAVENOUS | Status: AC
  Administered 2013-07-22: 09:00:00 via INTRAVENOUS

## 2013-07-22 MED ORDER — SODIUM CHLORIDE 0.9 % IV SOLN
5.0000 mg/kg | Freq: Once | INTRAVENOUS | Status: AC
  Administered 2013-07-22: 475 mg via INTRAVENOUS
  Filled 2013-07-22: qty 19

## 2013-07-22 MED ORDER — FLUOROURACIL CHEMO INJECTION 2.5 GM/50ML
400.0000 mg/m2 | Freq: Once | INTRAVENOUS | Status: AC
  Administered 2013-07-22: 900 mg via INTRAVENOUS
  Filled 2013-07-22: qty 18

## 2013-07-22 MED ORDER — HEPARIN SOD (PORK) LOCK FLUSH 100 UNIT/ML IV SOLN
250.0000 [IU] | Freq: Once | INTRAVENOUS | Status: DC | PRN
  Filled 2013-07-22: qty 5

## 2013-07-22 MED ORDER — SODIUM CHLORIDE 0.9 % IV SOLN
2400.0000 mg/m2 | INTRAVENOUS | Status: DC
  Administered 2013-07-22: 5300 mg via INTRAVENOUS
  Filled 2013-07-22: qty 106

## 2013-07-22 MED ORDER — ONDANSETRON 16 MG/50ML IVPB (CHCC)
16.0000 mg | Freq: Once | INTRAVENOUS | Status: AC
  Administered 2013-07-22: 16 mg via INTRAVENOUS

## 2013-07-22 MED ORDER — SODIUM CHLORIDE 0.9 % IJ SOLN
10.0000 mL | INTRAMUSCULAR | Status: DC | PRN
  Filled 2013-07-22: qty 10

## 2013-07-22 MED ORDER — ONDANSETRON 16 MG/50ML IVPB (CHCC)
INTRAVENOUS | Status: AC
  Filled 2013-07-22: qty 16

## 2013-07-22 MED ORDER — DEXAMETHASONE SODIUM PHOSPHATE 20 MG/5ML IJ SOLN
INTRAMUSCULAR | Status: AC
  Filled 2013-07-22: qty 5

## 2013-07-22 MED ORDER — IRINOTECAN HCL CHEMO INJECTION 100 MG/5ML
180.0000 mg/m2 | Freq: Once | INTRAVENOUS | Status: AC
  Administered 2013-07-22: 396 mg via INTRAVENOUS
  Filled 2013-07-22: qty 19.8

## 2013-07-22 MED ORDER — ATROPINE SULFATE 1 MG/ML IJ SOLN: 0.5000 mg | Freq: Once | INTRAMUSCULAR | Status: DC | PRN

## 2013-07-22 MED ORDER — DEXAMETHASONE SODIUM PHOSPHATE 20 MG/5ML IJ SOLN
20.0000 mg | Freq: Once | INTRAMUSCULAR | Status: AC
  Administered 2013-07-22: 20 mg via INTRAVENOUS

## 2013-07-22 MED ORDER — LEUCOVORIN CALCIUM INJECTION 350 MG
400.0000 mg/m2 | Freq: Once | INTRAVENOUS | Status: AC
  Administered 2013-07-22: 880 mg via INTRAVENOUS
  Filled 2013-07-22: qty 44

## 2013-07-22 MED ORDER — SODIUM CHLORIDE 0.9 % IJ SOLN
3.0000 mL | INTRAMUSCULAR | Status: DC | PRN
  Filled 2013-07-22: qty 10

## 2013-07-22 MED ORDER — ALTEPLASE 2 MG IJ SOLR
2.0000 mg | Freq: Once | INTRAMUSCULAR | Status: DC | PRN
  Filled 2013-07-22: qty 2

## 2013-07-22 NOTE — Progress Notes (Signed)
Pt started 1st cycle of folfiri with avastin. Consent form obtained. New packet for ambulatory pump completed and signiture obtained. Take home chemo spill kit discussed and given to pt.  Upon discharged pt had no additional questions.

## 2013-07-24 ENCOUNTER — Ambulatory Visit (HOSPITAL_BASED_OUTPATIENT_CLINIC_OR_DEPARTMENT_OTHER): Payer: Federal, State, Local not specified - PPO

## 2013-07-24 VITALS — BP 131/77 | HR 63 | Temp 97.4°F

## 2013-07-24 DIAGNOSIS — C19 Malignant neoplasm of rectosigmoid junction: Secondary | ICD-10-CM

## 2013-07-24 DIAGNOSIS — C787 Secondary malignant neoplasm of liver and intrahepatic bile duct: Secondary | ICD-10-CM

## 2013-07-24 MED ORDER — HEPARIN SOD (PORK) LOCK FLUSH 100 UNIT/ML IV SOLN
500.0000 [IU] | Freq: Once | INTRAVENOUS | Status: AC | PRN
  Administered 2013-07-24: 500 [IU]
  Filled 2013-07-24: qty 5

## 2013-07-24 MED ORDER — SODIUM CHLORIDE 0.9 % IJ SOLN
10.0000 mL | INTRAMUSCULAR | Status: DC | PRN
  Administered 2013-07-24: 10 mL
  Filled 2013-07-24: qty 10

## 2013-08-03 ENCOUNTER — Other Ambulatory Visit: Payer: Self-pay | Admitting: Oncology

## 2013-08-05 ENCOUNTER — Ambulatory Visit (HOSPITAL_BASED_OUTPATIENT_CLINIC_OR_DEPARTMENT_OTHER): Payer: Federal, State, Local not specified - PPO | Admitting: Oncology

## 2013-08-05 ENCOUNTER — Ambulatory Visit (HOSPITAL_BASED_OUTPATIENT_CLINIC_OR_DEPARTMENT_OTHER): Payer: Federal, State, Local not specified - PPO

## 2013-08-05 ENCOUNTER — Other Ambulatory Visit (HOSPITAL_BASED_OUTPATIENT_CLINIC_OR_DEPARTMENT_OTHER): Payer: Federal, State, Local not specified - PPO

## 2013-08-05 ENCOUNTER — Telehealth: Payer: Self-pay | Admitting: Oncology

## 2013-08-05 VITALS — BP 126/75 | HR 77 | Temp 97.3°F | Resp 19 | Ht 72.0 in | Wt 207.5 lb

## 2013-08-05 DIAGNOSIS — C787 Secondary malignant neoplasm of liver and intrahepatic bile duct: Secondary | ICD-10-CM

## 2013-08-05 DIAGNOSIS — D709 Neutropenia, unspecified: Secondary | ICD-10-CM

## 2013-08-05 DIAGNOSIS — C19 Malignant neoplasm of rectosigmoid junction: Secondary | ICD-10-CM

## 2013-08-05 DIAGNOSIS — D649 Anemia, unspecified: Secondary | ICD-10-CM

## 2013-08-05 DIAGNOSIS — Z5111 Encounter for antineoplastic chemotherapy: Secondary | ICD-10-CM

## 2013-08-05 DIAGNOSIS — Z79899 Other long term (current) drug therapy: Secondary | ICD-10-CM

## 2013-08-05 DIAGNOSIS — C187 Malignant neoplasm of sigmoid colon: Secondary | ICD-10-CM

## 2013-08-05 LAB — CBC WITH DIFFERENTIAL/PLATELET
BASO%: 1.3 % (ref 0.0–2.0)
Basophils Absolute: 0 10*3/uL (ref 0.0–0.1)
EOS ABS: 0.2 10*3/uL (ref 0.0–0.5)
EOS%: 4.4 % (ref 0.0–7.0)
HCT: 46.4 % (ref 38.4–49.9)
HGB: 15.7 g/dL (ref 13.0–17.1)
LYMPH%: 43.2 % (ref 14.0–49.0)
MCH: 32.6 pg (ref 27.2–33.4)
MCHC: 33.9 g/dL (ref 32.0–36.0)
MCV: 96.3 fL (ref 79.3–98.0)
MONO#: 0.5 10*3/uL (ref 0.1–0.9)
MONO%: 14.3 % — AB (ref 0.0–14.0)
NEUT#: 1.3 10*3/uL — ABNORMAL LOW (ref 1.5–6.5)
NEUT%: 36.8 % — ABNORMAL LOW (ref 39.0–75.0)
Platelets: 272 10*3/uL (ref 140–400)
RBC: 4.82 10*6/uL (ref 4.20–5.82)
RDW: 13.4 % (ref 11.0–14.6)
WBC: 3.6 10*3/uL — ABNORMAL LOW (ref 4.0–10.3)
lymph#: 1.5 10*3/uL (ref 0.9–3.3)

## 2013-08-05 LAB — UA PROTEIN, DIPSTICK - CHCC: Protein, ur: <30 mg/dL

## 2013-08-05 LAB — COMPREHENSIVE METABOLIC PANEL (CC13)
ALBUMIN: 3.7 g/dL (ref 3.5–5.0)
ALT: 50 U/L (ref 0–55)
AST: 49 U/L — ABNORMAL HIGH (ref 5–34)
Alkaline Phosphatase: 185 U/L — ABNORMAL HIGH (ref 40–150)
Anion Gap: 10 mEq/L (ref 3–11)
BUN: 16.1 mg/dL (ref 7.0–26.0)
CALCIUM: 9.3 mg/dL (ref 8.4–10.4)
CHLORIDE: 108 meq/L (ref 98–109)
CO2: 22 mEq/L (ref 22–29)
Creatinine: 0.9 mg/dL (ref 0.7–1.3)
GLUCOSE: 130 mg/dL (ref 70–140)
POTASSIUM: 4.3 meq/L (ref 3.5–5.1)
Sodium: 140 mEq/L (ref 136–145)
Total Bilirubin: 1.05 mg/dL (ref 0.20–1.20)
Total Protein: 7 g/dL (ref 6.4–8.3)

## 2013-08-05 MED ORDER — FLUOROURACIL CHEMO INJECTION 2.5 GM/50ML
400.0000 mg/m2 | Freq: Once | INTRAVENOUS | Status: AC
  Administered 2013-08-05: 900 mg via INTRAVENOUS
  Filled 2013-08-05: qty 18

## 2013-08-05 MED ORDER — SODIUM CHLORIDE 0.9 % IV SOLN
Freq: Once | INTRAVENOUS | Status: AC
  Administered 2013-08-05: 13:00:00 via INTRAVENOUS

## 2013-08-05 MED ORDER — ONDANSETRON 16 MG/50ML IVPB (CHCC)
INTRAVENOUS | Status: AC
  Filled 2013-08-05: qty 16

## 2013-08-05 MED ORDER — ATROPINE SULFATE 1 MG/ML IJ SOLN
0.5000 mg | Freq: Once | INTRAMUSCULAR | Status: AC | PRN
  Administered 2013-08-05: 0.5 mg via INTRAVENOUS

## 2013-08-05 MED ORDER — LEUCOVORIN CALCIUM INJECTION 350 MG
400.0000 mg/m2 | Freq: Once | INTRAVENOUS | Status: AC
  Administered 2013-08-05: 880 mg via INTRAVENOUS
  Filled 2013-08-05: qty 44

## 2013-08-05 MED ORDER — ONDANSETRON 16 MG/50ML IVPB (CHCC)
16.0000 mg | Freq: Once | INTRAVENOUS | Status: AC
  Administered 2013-08-05: 16 mg via INTRAVENOUS

## 2013-08-05 MED ORDER — ATROPINE SULFATE 1 MG/ML IJ SOLN
INTRAMUSCULAR | Status: AC
  Filled 2013-08-05: qty 1

## 2013-08-05 MED ORDER — SODIUM CHLORIDE 0.9 % IV SOLN
5.0000 mg/kg | Freq: Once | INTRAVENOUS | Status: AC
  Administered 2013-08-05: 475 mg via INTRAVENOUS
  Filled 2013-08-05: qty 19

## 2013-08-05 MED ORDER — DEXAMETHASONE SODIUM PHOSPHATE 20 MG/5ML IJ SOLN
20.0000 mg | Freq: Once | INTRAMUSCULAR | Status: AC
  Administered 2013-08-05: 20 mg via INTRAVENOUS

## 2013-08-05 MED ORDER — DEXAMETHASONE SODIUM PHOSPHATE 20 MG/5ML IJ SOLN
INTRAMUSCULAR | Status: AC
  Filled 2013-08-05: qty 5

## 2013-08-05 MED ORDER — DEXTROSE 5 % IV SOLN
180.0000 mg/m2 | Freq: Once | INTRAVENOUS | Status: AC
  Administered 2013-08-05: 396 mg via INTRAVENOUS
  Filled 2013-08-05: qty 19.8

## 2013-08-05 MED ORDER — SODIUM CHLORIDE 0.9 % IV SOLN
2400.0000 mg/m2 | INTRAVENOUS | Status: DC
  Administered 2013-08-05: 5300 mg via INTRAVENOUS
  Filled 2013-08-05: qty 106

## 2013-08-05 NOTE — Patient Instructions (Signed)
[facility] Discharge Instructions for Patients Receiving Chemotherapy  Today you received the following chemotherapy agents Camptosar/5FU/LV/Avastin  To help prevent nausea and vomiting after your treatment, we encourage you to take your nausea medication if needed.   If you develop nausea and vomiting that is not controlled by your nausea medication, call the clinic.   BELOW ARE SYMPTOMS THAT SHOULD BE REPORTED IMMEDIATELY:  *FEVER GREATER THAN 100.5 F  *CHILLS WITH OR WITHOUT FEVER  NAUSEA AND VOMITING THAT IS NOT CONTROLLED WITH YOUR NAUSEA MEDICATION  *UNUSUAL SHORTNESS OF BREATH  *UNUSUAL BRUISING OR BLEEDING  TENDERNESS IN MOUTH AND THROAT WITH OR WITHOUT PRESENCE OF ULCERS  *URINARY PROBLEMS  *BOWEL PROBLEMS  UNUSUAL RASH Items with * indicate a potential emergency and should be followed up as soon as possible.  Feel free to call the clinic you have any questions or concerns. The clinic phone number is [phone].

## 2013-08-05 NOTE — Patient Instructions (Signed)
Pegfilgrastim injection--This will be given on day of pump d/c What is this medicine? PEGFILGRASTIM (peg fil GRA stim) helps the body make more white blood cells. It is used to prevent infection in people with low amounts of white blood cells following cancer treatment. This medicine may be used for other purposes; ask your health care provider or pharmacist if you have questions. COMMON BRAND NAME(S): Neulasta What should I tell my health care provider before I take this medicine? They need to know if you have any of these conditions: -sickle cell disease -an unusual or allergic reaction to pegfilgrastim, filgrastim, E.coli protein, other medicines, foods, dyes, or preservatives -pregnant or trying to get pregnant -breast-feeding How should I use this medicine? This medicine is for injection under the skin. It is usually given by a health care professional in a hospital or clinic setting. If you get this medicine at home, you will be taught how to prepare and give this medicine. Do not shake this medicine. Use exactly as directed. Take your medicine at regular intervals. Do not take your medicine more often than directed. It is important that you put your used needles and syringes in a special sharps container. Do not put them in a trash can. If you do not have a sharps container, call your pharmacist or healthcare provider to get one. Talk to your pediatrician regarding the use of this medicine in children. While this drug may be prescribed for children who weigh more than 45 kg for selected conditions, precautions do apply Overdosage: If you think you have taken too much of this medicine contact a poison control center or emergency room at once. NOTE: This medicine is only for you. Do not share this medicine with others. What if I miss a dose? If you miss a dose, take it as soon as you can. If it is almost time for your next dose, take only that dose. Do not take double or extra doses. What  may interact with this medicine? -lithium -medicines for growth therapy This list may not describe all possible interactions. Give your health care provider a list of all the medicines, herbs, non-prescription drugs, or dietary supplements you use. Also tell them if you smoke, drink alcohol, or use illegal drugs. Some items may interact with your medicine. What should I watch for while using this medicine? Visit your doctor for regular check ups. You will need important blood work done while you are taking this medicine. What side effects may I notice from receiving this medicine? Side effects that you should report to your doctor or health care professional as soon as possible: -allergic reactions like skin rash, itching or hives, swelling of the face, lips, or tongue -breathing problems -fever -pain, redness, or swelling where injected -shoulder pain -stomach or side pain Side effects that usually do not require medical attention (report to your doctor or health care professional if they continue or are bothersome): -aches, pains -headache -loss of appetite -nausea, vomiting -unusually tired This list may not describe all possible side effects. Call your doctor for medical advice about side effects. You may report side effects to FDA at 1-800-FDA-1088. Where should I keep my medicine? Keep out of the reach of children. Store in a refrigerator between 2 and 8 degrees C (36 and 46 degrees F). Do not freeze. Keep in carton to protect from light. Throw away this medicine if it is left out of the refrigerator for more than 48 hours. Throw away any unused medicine  after the expiration date. NOTE: This sheet is a summary. It may not cover all possible information. If you have questions about this medicine, talk to your doctor, pharmacist, or health care provider.  2014, Elsevier/Gold Standard. (2007-12-02 15:41:44)

## 2013-08-05 NOTE — Telephone Encounter (Signed)
gv adn pritned aptp sched and avs for pt for March thru April....sed added tx....gv pof to MD to add MD visit on 4.8.15

## 2013-08-05 NOTE — Progress Notes (Signed)
[facility]    OFFICE PROGRESS NOTE   INTERVAL HISTORY:   Mr. Hardage returns for followup of metastatic colon cancer. He completed a first cycle of FOLFIRI/Avastin on 07/23/2011. No nausea/vomiting, mouth sores, or diarrhea following chemotherapy. He has a small ulceration at the angle of the left lip. The hand/foot symptoms continue to improve. No bleeding or symptom of venous/arterial thrombosis. The right abdominal pain has resolved.  Objective:  Vital signs in last 24 hours:  Blood pressure 126/75, pulse 77, temperature 97.3 F (36.3 C), temperature source Oral, resp. rate 19, height 6' (1.829 m), weight 207 lb 8 oz (94.121 kg).    HEENT: No thrush or ulcers, mild left angular chelitis Resp: Lungs clear bilaterally Cardio: Regular rate and rhythm GI: No hepatomegaly, nontender Vascular: No leg edema  Skin: Skin thickening and callous formation at the hands and feet. No erythema.   Portacath/PICC-without erythema  Lab Results:  Lab Results  Component Value Date   WBC 3.6* 08/05/2013   HGB 15.7 08/05/2013   HCT 46.4 08/05/2013   MCV 96.3 08/05/2013   PLT 272 08/05/2013   NEUTROABS 1.3* 08/05/2013      Medications: I have reviewed the patient's current medications.  Assessment/Plan: 1. Metastatic colon cancer status post low anterior resection on 12/22/2011 (T4, N2, M1). K-ras wild-type (extended K-ras testing with no K-ras, NRAS, or BRAF mutation identified). Initiation of CAPOX/Avastin on 01/23/2012. The CEA was in normal range on 05/28/2012. Restaging CT 04/12/2012 after 4 cycles of CAPOX/Avastin confirmed improvement in the hepatic metastases. He completed cycle 7 CAPOX/Avastin beginning 06/18/2012. He completed cycle 8 systemic therapy with Xeloda and Avastin (oxaliplatin discontinued) beginning 07/09/2012. He completed cycle 9 beginning 07/30/2012 and cycle 10 beginning on 08/20/2012. Restaging CT evaluation 08/13/2012 with further improvement in liver  metastases. He began another cycle of Xeloda on 09/10/2012. He declined further Avastin. He completed a cycle of dose reduced Xeloda beginning on 10/01/2012 and was then maintained off of systemic therapy. Systemic therapy resumed with Xeloda/Avastin 12/31/2012 (completed 3 days of Xeloda with cycle 1). The Xeloda was dose reduced with the cycle of chemotherapy beginning 04/01/2013 Restaging CT 04/14/2013, compared to 08/13/2012 revealed a mixed response in the liver  MRI of the abdomen 05/02/2013 confirmed a single calcified left hepatic lobe lesion and multiple peripherally enhancing right hepatic lobe lesions.  Diagnostic laparoscopy 06/10/2013 with findings of adhesions to midline; numerous lesions seen on the right liver with the largest on the dome; peritoneal nodules near right liver mass; small firm areas in left liver; small left liver. 2 biopsies were obtained from the left liver and a peritoneal nodule seen over the right sided liver lesion was also biopsied. Anterior liver core biopsy showed benign liver tissue with mild steatosis; no evidence of malignancy. Posterior liver wedge biopsy showed benign liver tissue; no evidence of malignancy. 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  CEA 06/17/2013 increased at 15.5, 24 on 07/22/2013 Cycle 1 of FOLFIRI/Avastin 07/22/2013 2. Microcytic anemia-resolved. 3. History of weight loss secondary to metastatic colon cancer.  4. Status post Port-A-Cath placement 01/10/2012. 5. Hand-foot syndrome secondary to Xeloda. The Xeloda was dose reduced to 1000 mg twice daily beginning with cycle 4 CAPOX. The hand-foot symptoms progressed. The Xeloda was further dose reduced beginning with treatment on 04/01/2013. Most recent Xeloda dose 1000 mg twice daily. 6. Glaucoma-now on medical therapy. 7. Neutropenia-Neulasta will be added with cycle 2 FOLFIRI   Disposition:  He tolerated the first  cycle of  chemotherapy without significant acute toxicity. He has mild neutropenia today. The plan is to proceed with cycle 2 FOLFIRI/Avastin today. We will check a CEA when he returns in 2 weeks. Mr. Fye will receive Neulasta support with this cycle of chemotherapy. We reviewed the potential toxicities associated with Neulasta and he agrees to proceed. He knows to contact us for a fever or symptoms of infection.   Betsy Coder, MD  08/05/2013  1:58 PM

## 2013-08-07 ENCOUNTER — Ambulatory Visit (HOSPITAL_BASED_OUTPATIENT_CLINIC_OR_DEPARTMENT_OTHER): Payer: Federal, State, Local not specified - PPO

## 2013-08-07 VITALS — BP 117/78 | HR 96 | Temp 98.2°F

## 2013-08-07 DIAGNOSIS — C787 Secondary malignant neoplasm of liver and intrahepatic bile duct: Secondary | ICD-10-CM

## 2013-08-07 DIAGNOSIS — C19 Malignant neoplasm of rectosigmoid junction: Secondary | ICD-10-CM

## 2013-08-07 DIAGNOSIS — Z5189 Encounter for other specified aftercare: Secondary | ICD-10-CM

## 2013-08-07 DIAGNOSIS — C187 Malignant neoplasm of sigmoid colon: Secondary | ICD-10-CM

## 2013-08-07 MED ORDER — HEPARIN SOD (PORK) LOCK FLUSH 100 UNIT/ML IV SOLN
500.0000 [IU] | Freq: Once | INTRAVENOUS | Status: AC | PRN
  Administered 2013-08-07: 500 [IU]
  Filled 2013-08-07: qty 5

## 2013-08-07 MED ORDER — SODIUM CHLORIDE 0.9 % IJ SOLN
10.0000 mL | INTRAMUSCULAR | Status: DC | PRN
  Administered 2013-08-07: 10 mL
  Filled 2013-08-07: qty 10

## 2013-08-07 MED ORDER — PEGFILGRASTIM INJECTION 6 MG/0.6ML
6.0000 mg | Freq: Once | SUBCUTANEOUS | Status: AC
  Administered 2013-08-07: 6 mg via SUBCUTANEOUS
  Filled 2013-08-07: qty 0.6

## 2013-08-17 ENCOUNTER — Other Ambulatory Visit: Payer: Self-pay | Admitting: Oncology

## 2013-08-20 ENCOUNTER — Ambulatory Visit (HOSPITAL_BASED_OUTPATIENT_CLINIC_OR_DEPARTMENT_OTHER): Payer: Federal, State, Local not specified - PPO

## 2013-08-20 ENCOUNTER — Telehealth: Payer: Self-pay | Admitting: Oncology

## 2013-08-20 ENCOUNTER — Other Ambulatory Visit: Payer: Self-pay

## 2013-08-20 ENCOUNTER — Ambulatory Visit (HOSPITAL_BASED_OUTPATIENT_CLINIC_OR_DEPARTMENT_OTHER): Payer: Federal, State, Local not specified - PPO | Admitting: Oncology

## 2013-08-20 ENCOUNTER — Other Ambulatory Visit (HOSPITAL_BASED_OUTPATIENT_CLINIC_OR_DEPARTMENT_OTHER): Payer: Federal, State, Local not specified - PPO

## 2013-08-20 VITALS — BP 133/82 | HR 68 | Temp 97.4°F | Resp 18 | Ht 72.0 in | Wt 205.5 lb

## 2013-08-20 VITALS — BP 129/85 | HR 56

## 2013-08-20 DIAGNOSIS — D709 Neutropenia, unspecified: Secondary | ICD-10-CM

## 2013-08-20 DIAGNOSIS — Z5111 Encounter for antineoplastic chemotherapy: Secondary | ICD-10-CM

## 2013-08-20 DIAGNOSIS — C187 Malignant neoplasm of sigmoid colon: Secondary | ICD-10-CM

## 2013-08-20 DIAGNOSIS — C787 Secondary malignant neoplasm of liver and intrahepatic bile duct: Secondary | ICD-10-CM

## 2013-08-20 DIAGNOSIS — C19 Malignant neoplasm of rectosigmoid junction: Secondary | ICD-10-CM

## 2013-08-20 DIAGNOSIS — L270 Generalized skin eruption due to drugs and medicaments taken internally: Secondary | ICD-10-CM

## 2013-08-20 DIAGNOSIS — K625 Hemorrhage of anus and rectum: Secondary | ICD-10-CM

## 2013-08-20 DIAGNOSIS — Z5112 Encounter for antineoplastic immunotherapy: Secondary | ICD-10-CM

## 2013-08-20 LAB — COMPREHENSIVE METABOLIC PANEL (CC13)
ALT: 44 U/L (ref 0–55)
ANION GAP: 8 meq/L (ref 3–11)
AST: 43 U/L — AB (ref 5–34)
Albumin: 3.7 g/dL (ref 3.5–5.0)
Alkaline Phosphatase: 205 U/L — ABNORMAL HIGH (ref 40–150)
BILIRUBIN TOTAL: 0.76 mg/dL (ref 0.20–1.20)
BUN: 20.3 mg/dL (ref 7.0–26.0)
CHLORIDE: 107 meq/L (ref 98–109)
CO2: 23 mEq/L (ref 22–29)
Calcium: 9.5 mg/dL (ref 8.4–10.4)
Creatinine: 0.9 mg/dL (ref 0.7–1.3)
Glucose: 115 mg/dl (ref 70–140)
Potassium: 4.6 mEq/L (ref 3.5–5.1)
SODIUM: 139 meq/L (ref 136–145)
TOTAL PROTEIN: 7.4 g/dL (ref 6.4–8.3)

## 2013-08-20 LAB — CBC WITH DIFFERENTIAL/PLATELET
BASO%: 0.8 % (ref 0.0–2.0)
Basophils Absolute: 0.1 10*3/uL (ref 0.0–0.1)
EOS%: 1.3 % (ref 0.0–7.0)
Eosinophils Absolute: 0.1 10*3/uL (ref 0.0–0.5)
HCT: 45.4 % (ref 38.4–49.9)
HGB: 15.3 g/dL (ref 13.0–17.1)
LYMPH#: 2.8 10*3/uL (ref 0.9–3.3)
LYMPH%: 25.1 % (ref 14.0–49.0)
MCH: 32.1 pg (ref 27.2–33.4)
MCHC: 33.6 g/dL (ref 32.0–36.0)
MCV: 95.5 fL (ref 79.3–98.0)
MONO#: 0.9 10*3/uL (ref 0.1–0.9)
MONO%: 7.8 % (ref 0.0–14.0)
NEUT#: 7.3 10*3/uL — ABNORMAL HIGH (ref 1.5–6.5)
NEUT%: 65 % (ref 39.0–75.0)
Platelets: 188 10*3/uL (ref 140–400)
RBC: 4.75 10*6/uL (ref 4.20–5.82)
RDW: 13.9 % (ref 11.0–14.6)
WBC: 11.2 10*3/uL — AB (ref 4.0–10.3)

## 2013-08-20 LAB — CEA: CEA: 20.4 ng/mL — ABNORMAL HIGH (ref 0.0–5.0)

## 2013-08-20 MED ORDER — ONDANSETRON 16 MG/50ML IVPB (CHCC)
INTRAVENOUS | Status: AC
  Filled 2013-08-20: qty 16

## 2013-08-20 MED ORDER — DEXAMETHASONE SODIUM PHOSPHATE 20 MG/5ML IJ SOLN
INTRAMUSCULAR | Status: AC
  Filled 2013-08-20: qty 5

## 2013-08-20 MED ORDER — SODIUM CHLORIDE 0.9 % IV SOLN
2400.0000 mg/m2 | INTRAVENOUS | Status: DC
  Administered 2013-08-20: 5300 mg via INTRAVENOUS
  Filled 2013-08-20: qty 106

## 2013-08-20 MED ORDER — FLUOROURACIL CHEMO INJECTION 2.5 GM/50ML
400.0000 mg/m2 | Freq: Once | INTRAVENOUS | Status: AC
  Administered 2013-08-20: 900 mg via INTRAVENOUS
  Filled 2013-08-20: qty 18

## 2013-08-20 MED ORDER — DEXAMETHASONE SODIUM PHOSPHATE 20 MG/5ML IJ SOLN
20.0000 mg | Freq: Once | INTRAMUSCULAR | Status: AC
  Administered 2013-08-20: 20 mg via INTRAVENOUS

## 2013-08-20 MED ORDER — DOCUSATE SODIUM 100 MG PO CAPS: 100.0000 mg | ORAL_CAPSULE | Freq: Two times a day (BID) | ORAL | Status: DC

## 2013-08-20 MED ORDER — ATROPINE SULFATE 1 MG/ML IJ SOLN
INTRAMUSCULAR | Status: AC
  Filled 2013-08-20: qty 1

## 2013-08-20 MED ORDER — SODIUM CHLORIDE 0.9 % IV SOLN: Freq: Once | INTRAVENOUS | Status: DC

## 2013-08-20 MED ORDER — IRINOTECAN HCL CHEMO INJECTION 100 MG/5ML
182.0000 mg/m2 | Freq: Once | INTRAVENOUS | Status: AC
  Administered 2013-08-20: 400 mg via INTRAVENOUS
  Filled 2013-08-20: qty 20

## 2013-08-20 MED ORDER — ONDANSETRON 16 MG/50ML IVPB (CHCC)
16.0000 mg | Freq: Once | INTRAVENOUS | Status: AC
Start: 2013-08-20 — End: 2013-08-20
  Administered 2013-08-20: 16 mg via INTRAVENOUS

## 2013-08-20 MED ORDER — SODIUM CHLORIDE 0.9 % IV SOLN
5.0000 mg/kg | Freq: Once | INTRAVENOUS | Status: AC
  Administered 2013-08-20: 475 mg via INTRAVENOUS
  Filled 2013-08-20: qty 19

## 2013-08-20 MED ORDER — SODIUM CHLORIDE 0.9 % IV SOLN
Freq: Once | INTRAVENOUS | Status: AC
  Administered 2013-08-20: 11:00:00 via INTRAVENOUS

## 2013-08-20 MED ORDER — LEUCOVORIN CALCIUM INJECTION 350 MG
900.0000 mg | Freq: Once | INTRAVENOUS | Status: AC
  Administered 2013-08-20: 900 mg via INTRAVENOUS
  Filled 2013-08-20: qty 45

## 2013-08-20 NOTE — Patient Instructions (Addendum)
Per Dr. Benay Spice : If holding the atropine does not help your constipation, start Colace (OTC) twice daily and push fluids.  [facility] Discharge Instructions for Patients Receiving Chemotherapy  Today you received the following chemotherapy agents Avastin, Leucovorin, Camptosar and Adrucil.  To help prevent nausea and vomiting after your treatment, we encourage you to take your nausea medication as prescribed.    If you develop nausea and vomiting that is not controlled by your nausea medication, call the clinic.   BELOW ARE SYMPTOMS THAT SHOULD BE REPORTED IMMEDIATELY:  *FEVER GREATER THAN 100.5 F  *CHILLS WITH OR WITHOUT FEVER  NAUSEA AND VOMITING THAT IS NOT CONTROLLED WITH YOUR NAUSEA MEDICATION  *UNUSUAL SHORTNESS OF BREATH  *UNUSUAL BRUISING OR BLEEDING  TENDERNESS IN MOUTH AND THROAT WITH OR WITHOUT PRESENCE OF ULCERS  *URINARY PROBLEMS  *BOWEL PROBLEMS  UNUSUAL RASH Items with * indicate a potential emergency and should be followed up as soon as possible.  Feel free to call the clinic you have any questions or concerns. The clinic phone number is [phone].

## 2013-08-20 NOTE — Telephone Encounter (Signed)
gv adnprinted appt sched and avs for pt for April adn May.....sed added tx. °

## 2013-08-20 NOTE — Progress Notes (Signed)
[facility] OFFICE PROGRESS NOTE   Diagnosis: Metastatic colon cancer  INTERVAL HISTORY:   [name] returns as scheduled. He completed another cycle of FOLFIRI/Avastin 08/05/2013. He reports a "sore "at the angle of the left lips that has resolved. No diarrhea. He complains of malaise following chemotherapy. He has mild nose bleeding and reports several episodes of bright red blood with bowel movements. He continues to work. No pain after the Neulasta.  Objective:  Vital signs in last 24 hours:  Blood pressure 133/82, pulse 68, temperature 97.4 F (36.3 C), temperature source Oral, resp. rate 18, height 6' (1.829 m), weight 205 lb 8 oz (93.214 kg).    HEENT: No thrush or ulcers Resp: Lungs clear bilaterally Cardio: Regular rate and rhythm GI: No hepatomegaly, nontender Vascular: No leg edema Skin: Mild hyperpigmentation and skin thickening over the hands and feet   Portacath/PICC-without erythema  Lab Results:  Lab Results  Component Value Date   WBC 11.2* 08/20/2013   HGB 15.3 08/20/2013   HCT 45.4 08/20/2013   MCV 95.5 08/20/2013   PLT 188 08/20/2013   NEUTROABS 7.3* 08/20/2013     Medications: I have reviewed the patient's current medications.  Assessment/Plan: 1. Metastatic colon cancer status post low anterior resection on 12/22/2011 (T4, N2, M1). K-ras wild-type (extended K-ras testing with no K-ras, NRAS, or BRAF mutation identified). Initiation of CAPOX/Avastin on 01/23/2012. The CEA was in normal range on 05/28/2012. Restaging CT 04/12/2012 after 4 cycles of CAPOX/Avastin confirmed improvement in the hepatic metastases. He completed cycle 7 CAPOX/Avastin beginning 06/18/2012. He completed cycle 8 systemic therapy with Xeloda and Avastin (oxaliplatin discontinued) beginning 07/09/2012. He completed cycle 9 beginning 07/30/2012 and cycle 10 beginning on 08/20/2012. Restaging CT evaluation 08/13/2012 with further improvement in liver metastases. He began another  cycle of Xeloda on 09/10/2012. He declined further Avastin. He completed a cycle of dose reduced Xeloda beginning on 10/01/2012 and was then maintained off of systemic therapy. Systemic therapy resumed with Xeloda/Avastin 12/31/2012 (completed 3 days of Xeloda with cycle 1). The Xeloda was dose reduced with the cycle of chemotherapy beginning 04/01/2013 Restaging CT 04/14/2013, compared to 08/13/2012 revealed a mixed response in the liver  MRI of the abdomen 05/02/2013 confirmed a single calcified left hepatic lobe lesion and multiple peripherally enhancing right hepatic lobe lesions.  Diagnostic laparoscopy 06/10/2013 with findings of adhesions to midline; numerous lesions seen on the right liver with the largest on the dome; peritoneal nodules near right liver mass; small firm areas in left liver; small left liver. 2 biopsies were obtained from the left liver and a peritoneal nodule seen over the right sided liver lesion was also biopsied. Anterior liver core biopsy showed benign liver tissue with mild steatosis; no evidence of malignancy. Posterior liver wedge biopsy showed benign liver tissue; no evidence of malignancy. 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  CEA 06/17/2013 increased at 15.5, 24 on 07/22/2013  Cycle 1 of FOLFIRI/Avastin 07/22/2013 2. Microcytic anemia-resolved. 3. History of weight loss secondary to metastatic colon cancer.  4. Status post Port-A-Cath placement 01/10/2012. 5. Hand-foot syndrome secondary to Xeloda. The Xeloda was dose reduced to 1000 mg twice daily beginning with cycle 4 CAPOX. The hand-foot symptoms progressed. The Xeloda was further dose reduced beginning with treatment on 04/01/2013. Most recent Xeloda dose 1000 mg twice daily. 6. Glaucoma-now on medical therapy. 7. Neutropenia-Neulasta will be added with cycle 2 FOLFIRI 8. Rectal bleeding with bowel movements-? Hemorrhoid bleeding,? Related to  Avastin  Disposition:  His overall status appears unchanged. He will not receive Neulasta with this cycle. Mr. Polinski will contact us for increased rectal bleeding. He declined a referral to Dr. Paulita Fujita. He had many questions regarding treatment options. We reviewed the December 2014 liver MRI. He is in agreement with continuing the FOLFIRI/Avastin the plan to obtain a restaging CT after 5 or 6 cycles.  He will return for an office visit and chemotherapy in 2 weeks. We will followup on the CEA from today.  Ladell Pier, MD  08/20/2013  10:59 AM

## 2013-08-21 ENCOUNTER — Other Ambulatory Visit: Payer: Self-pay

## 2013-08-22 ENCOUNTER — Ambulatory Visit: Payer: Federal, State, Local not specified - PPO

## 2013-08-22 ENCOUNTER — Ambulatory Visit (HOSPITAL_BASED_OUTPATIENT_CLINIC_OR_DEPARTMENT_OTHER): Payer: Federal, State, Local not specified - PPO

## 2013-08-22 VITALS — BP 124/69 | HR 77 | Temp 97.4°F

## 2013-08-22 DIAGNOSIS — C187 Malignant neoplasm of sigmoid colon: Secondary | ICD-10-CM

## 2013-08-22 DIAGNOSIS — C787 Secondary malignant neoplasm of liver and intrahepatic bile duct: Secondary | ICD-10-CM

## 2013-08-22 DIAGNOSIS — C19 Malignant neoplasm of rectosigmoid junction: Secondary | ICD-10-CM

## 2013-08-22 MED ORDER — HEPARIN SOD (PORK) LOCK FLUSH 100 UNIT/ML IV SOLN
500.0000 [IU] | Freq: Once | INTRAVENOUS | Status: AC | PRN
  Administered 2013-08-22: 500 [IU]
  Filled 2013-08-22: qty 5

## 2013-08-22 MED ORDER — SODIUM CHLORIDE 0.9 % IJ SOLN
10.0000 mL | INTRAMUSCULAR | Status: DC | PRN
  Administered 2013-08-22: 10 mL
  Filled 2013-08-22: qty 10

## 2013-08-25 ENCOUNTER — Ambulatory Visit: Payer: Federal, State, Local not specified - PPO | Admitting: Oncology

## 2013-08-25 ENCOUNTER — Telehealth: Payer: Self-pay | Admitting: *Deleted

## 2013-08-25 NOTE — Telephone Encounter (Signed)
Per Dr. Benay Spice, left vm --cea is stable, f/u appt schedule for 09/02/13 with [name], NP and if questions to call office.

## 2013-08-25 NOTE — Telephone Encounter (Signed)
Message copied by Domenic Schwab on Mon Aug 25, 2013 10:56 AM ------      Message from: Betsy Coder B      Created: Wed Aug 20, 2013  5:27 PM       Please call patient, cea is stable, f/u as scheduled ------

## 2013-08-31 ENCOUNTER — Other Ambulatory Visit: Payer: Self-pay | Admitting: Oncology

## 2013-09-02 ENCOUNTER — Ambulatory Visit (HOSPITAL_BASED_OUTPATIENT_CLINIC_OR_DEPARTMENT_OTHER): Payer: Federal, State, Local not specified - PPO

## 2013-09-02 ENCOUNTER — Telehealth: Payer: Self-pay | Admitting: Oncology

## 2013-09-02 ENCOUNTER — Ambulatory Visit (HOSPITAL_BASED_OUTPATIENT_CLINIC_OR_DEPARTMENT_OTHER): Payer: Federal, State, Local not specified - PPO | Admitting: Nurse Practitioner

## 2013-09-02 ENCOUNTER — Other Ambulatory Visit (HOSPITAL_BASED_OUTPATIENT_CLINIC_OR_DEPARTMENT_OTHER): Payer: Federal, State, Local not specified - PPO

## 2013-09-02 VITALS — BP 115/75

## 2013-09-02 VITALS — BP 125/91 | HR 64 | Temp 97.3°F | Resp 18 | Ht 72.0 in | Wt 205.9 lb

## 2013-09-02 DIAGNOSIS — Z5112 Encounter for antineoplastic immunotherapy: Secondary | ICD-10-CM

## 2013-09-02 DIAGNOSIS — C19 Malignant neoplasm of rectosigmoid junction: Secondary | ICD-10-CM

## 2013-09-02 DIAGNOSIS — C187 Malignant neoplasm of sigmoid colon: Secondary | ICD-10-CM

## 2013-09-02 DIAGNOSIS — D709 Neutropenia, unspecified: Secondary | ICD-10-CM

## 2013-09-02 DIAGNOSIS — K5900 Constipation, unspecified: Secondary | ICD-10-CM

## 2013-09-02 DIAGNOSIS — Z5111 Encounter for antineoplastic chemotherapy: Secondary | ICD-10-CM

## 2013-09-02 DIAGNOSIS — K625 Hemorrhage of anus and rectum: Secondary | ICD-10-CM

## 2013-09-02 DIAGNOSIS — C787 Secondary malignant neoplasm of liver and intrahepatic bile duct: Secondary | ICD-10-CM

## 2013-09-02 DIAGNOSIS — C786 Secondary malignant neoplasm of retroperitoneum and peritoneum: Secondary | ICD-10-CM

## 2013-09-02 LAB — COMPREHENSIVE METABOLIC PANEL (CC13)
ALBUMIN: 3.9 g/dL (ref 3.5–5.0)
ALT: 35 U/L (ref 0–55)
AST: 39 U/L — ABNORMAL HIGH (ref 5–34)
Alkaline Phosphatase: 153 U/L — ABNORMAL HIGH (ref 40–150)
Anion Gap: 10 mEq/L (ref 3–11)
BUN: 15 mg/dL (ref 7.0–26.0)
CHLORIDE: 108 meq/L (ref 98–109)
CO2: 21 meq/L — AB (ref 22–29)
Calcium: 9.6 mg/dL (ref 8.4–10.4)
Creatinine: 0.8 mg/dL (ref 0.7–1.3)
Glucose: 108 mg/dl (ref 70–140)
Potassium: 4 mEq/L (ref 3.5–5.1)
SODIUM: 140 meq/L (ref 136–145)
Total Bilirubin: 1.1 mg/dL (ref 0.20–1.20)
Total Protein: 7.4 g/dL (ref 6.4–8.3)

## 2013-09-02 LAB — CBC WITH DIFFERENTIAL/PLATELET
BASO%: 1.8 % (ref 0.0–2.0)
Basophils Absolute: 0.1 10*3/uL (ref 0.0–0.1)
EOS%: 4.8 % (ref 0.0–7.0)
Eosinophils Absolute: 0.2 10*3/uL (ref 0.0–0.5)
HCT: 41.7 % (ref 38.4–49.9)
HGB: 14.3 g/dL (ref 13.0–17.1)
LYMPH#: 1.5 10*3/uL (ref 0.9–3.3)
LYMPH%: 39.4 % (ref 14.0–49.0)
MCH: 32.5 pg (ref 27.2–33.4)
MCHC: 34.3 g/dL (ref 32.0–36.0)
MCV: 94.8 fL (ref 79.3–98.0)
MONO#: 0.4 10*3/uL (ref 0.1–0.9)
MONO%: 10.3 % (ref 0.0–14.0)
NEUT#: 1.6 10*3/uL (ref 1.5–6.5)
NEUT%: 43.7 % (ref 39.0–75.0)
Platelets: 252 10*3/uL (ref 140–400)
RBC: 4.4 10*6/uL (ref 4.20–5.82)
RDW: 14.7 % — AB (ref 11.0–14.6)
WBC: 3.7 10*3/uL — AB (ref 4.0–10.3)

## 2013-09-02 LAB — UA PROTEIN, DIPSTICK - CHCC: Protein, ur: NEGATIVE mg/dL

## 2013-09-02 MED ORDER — IRINOTECAN HCL CHEMO INJECTION 100 MG/5ML
180.0000 mg/m2 | Freq: Once | INTRAVENOUS | Status: AC
  Administered 2013-09-02: 400 mg via INTRAVENOUS
  Filled 2013-09-02: qty 20

## 2013-09-02 MED ORDER — DEXAMETHASONE SODIUM PHOSPHATE 20 MG/5ML IJ SOLN
INTRAMUSCULAR | Status: AC
  Filled 2013-09-02: qty 5

## 2013-09-02 MED ORDER — ATROPINE SULFATE 1 MG/ML IJ SOLN: 0.5000 mg | Freq: Once | INTRAMUSCULAR | Status: DC | PRN

## 2013-09-02 MED ORDER — ONDANSETRON 16 MG/50ML IVPB (CHCC)
INTRAVENOUS | Status: AC
  Filled 2013-09-02: qty 16

## 2013-09-02 MED ORDER — DEXTROSE 5 % IV SOLN
400.0000 mg/m2 | Freq: Once | INTRAVENOUS | Status: AC
  Administered 2013-09-02: 900 mg via INTRAVENOUS
  Filled 2013-09-02: qty 45

## 2013-09-02 MED ORDER — SODIUM CHLORIDE 0.9 % IV SOLN
5.0000 mg/kg | Freq: Once | INTRAVENOUS | Status: AC
  Administered 2013-09-02: 475 mg via INTRAVENOUS
  Filled 2013-09-02: qty 19

## 2013-09-02 MED ORDER — SODIUM CHLORIDE 0.9 % IV SOLN
Freq: Once | INTRAVENOUS | Status: AC
  Administered 2013-09-02: 11:00:00 via INTRAVENOUS

## 2013-09-02 MED ORDER — FLUOROURACIL CHEMO INJECTION 2.5 GM/50ML
400.0000 mg/m2 | Freq: Once | INTRAVENOUS | Status: AC
  Administered 2013-09-02: 900 mg via INTRAVENOUS
  Filled 2013-09-02: qty 18

## 2013-09-02 MED ORDER — ONDANSETRON 16 MG/50ML IVPB (CHCC)
16.0000 mg | Freq: Once | INTRAVENOUS | Status: AC
  Administered 2013-09-02: 16 mg via INTRAVENOUS

## 2013-09-02 MED ORDER — SODIUM CHLORIDE 0.9 % IV SOLN
2400.0000 mg/m2 | INTRAVENOUS | Status: DC
  Administered 2013-09-02: 5300 mg via INTRAVENOUS
  Filled 2013-09-02: qty 106

## 2013-09-02 MED ORDER — DEXAMETHASONE SODIUM PHOSPHATE 20 MG/5ML IJ SOLN
20.0000 mg | Freq: Once | INTRAMUSCULAR | Status: AC
  Administered 2013-09-02: 20 mg via INTRAVENOUS

## 2013-09-02 NOTE — Telephone Encounter (Signed)
GV ADN PRITNED APPT SCHED AND AVS FOR PT FOR aPRIL AND MAY....SED ADDED TX.

## 2013-09-02 NOTE — Progress Notes (Signed)
[facility] OFFICE PROGRESS NOTE   Diagnosis:  Metastatic colon cancer.  INTERVAL HISTORY:   He returns as scheduled. He completed cycle 3 FOLFIRI/Avastin 08/20/2013. He denies nausea/vomiting. No mouth sores though he does note that his tongue is "sensitive" to certain foods. He continues to note "sores" at the angle of the lips. He denies diarrhea. He has been constipated. He is currently taking Colace. No further rectal bleeding. He has contacted Dr. Erlinda Hong office to discuss scheduling a colonoscopy. He intermittently notes blood with nose blowing. No shortness of breath or chest pain. No leg swelling or calf pain. He notes intermittent tingling in the fingertips.  Objective:  Vital signs in last 24 hours:  Blood pressure 125/91, pulse 64, temperature 97.3 F (36.3 C), temperature source Oral, resp. rate 18, height 6' (1.829 m), weight 205 lb 14.4 oz (93.396 kg).    HEENT: No thrush or ulcerations. Resp: Lungs clear. Cardio: Regular cardiac rhythm. GI: Abdomen soft and nontender. No hepatomegaly. Vascular: No leg edema.   Port-A-Cath site without erythema.    Lab Results:  Lab Results  Component Value Date   WBC 3.7* 09/02/2013   HGB 14.3 09/02/2013   HCT 41.7 09/02/2013   MCV 94.8 09/02/2013   PLT 252 09/02/2013   NEUTROABS 1.6 09/02/2013    Imaging:  No results found.  Medications: I have reviewed the patient's current medications.  Assessment/Plan: 1. Metastatic colon cancer status post low anterior resection on 12/22/2011 (T4, N2, M1). K-ras wild-type (extended K-ras testing with no K-ras, NRAS, or BRAF mutation identified). Initiation of CAPOX/Avastin on 01/23/2012. The CEA was in normal range on 05/28/2012. Restaging CT 04/12/2012 after 4 cycles of CAPOX/Avastin confirmed improvement in the hepatic metastases. He completed cycle 7 CAPOX/Avastin beginning 06/18/2012. He completed cycle 8 systemic therapy with Xeloda and Avastin (oxaliplatin  discontinued) beginning 07/09/2012. He completed cycle 9 beginning 07/30/2012 and cycle 10 beginning on 08/20/2012. Restaging CT evaluation 08/13/2012 with further improvement in liver metastases. He began another cycle of Xeloda on 09/10/2012. He declined further Avastin. He completed a cycle of dose reduced Xeloda beginning on 10/01/2012 and was then maintained off of systemic therapy. Systemic therapy resumed with Xeloda/Avastin 12/31/2012 (completed 3 days of Xeloda with cycle 1). The Xeloda was dose reduced with the cycle of chemotherapy beginning 04/01/2013 Restaging CT 04/14/2013, compared to 08/13/2012 revealed a mixed response in the liver  MRI of the abdomen 05/02/2013 confirmed a single calcified left hepatic lobe lesion and multiple peripherally enhancing right hepatic lobe lesions.  Diagnostic laparoscopy 06/10/2013 with findings of adhesions to midline; numerous lesions seen on the right liver with the largest on the dome; peritoneal nodules near right liver mass; small firm areas in left liver; small left liver. 2 biopsies were obtained from the left liver and a peritoneal nodule seen over the right sided liver lesion was also biopsied. Anterior liver core biopsy showed benign liver tissue with mild steatosis; no evidence of malignancy. Posterior liver wedge biopsy showed benign liver tissue; no evidence of malignancy. 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  CEA 06/17/2013 increased at 15.5, 24 on 07/22/2013  Cycle 1 of FOLFIRI/Avastin 07/22/2013. Cycle 2 FOLFIRI/Avastin 08/05/2013. Cycle 3 FOLFIRI/Avastin 08/20/2013. CEA improved at 20.4 on 08/20/2013. 2. Microcytic anemia-resolved. 3. History of weight loss secondary to metastatic colon cancer.  4. Status post Port-A-Cath placement 01/10/2012. 5. Hand-foot syndrome secondary to Xeloda. The Xeloda was dose reduced to 1000 mg twice daily beginning with cycle 4 CAPOX. The  hand-foot  symptoms progressed. The Xeloda was further dose reduced beginning with treatment on 04/01/2013. Most recent Xeloda dose 1000 mg twice daily. 6. Glaucoma-now on medical therapy. 7. Neutropenia-Neulasta will be added with cycle 2 FOLFIRI. 8. Rectal bleeding with bowel movements-? Hemorrhoid bleeding,? Related to Avastin. He reports no further bleeding. He has contacted Dr. Paulita Fujita to discuss a colonoscopy. 9. Constipation. He will increase Colace to twice daily and began daily MiraLAX. He knows to contact the office if this is not effective.   Disposition: He appears stable. He has completed 3 cycles of FOLFIRI/Avastin. Plan to proceed with cycle 4 today as scheduled. He will receive Neulasta on the day of pump discontinuation.  He will return for a followup visit and cycle 5 FOLFIRI/Avastin on 09/17/2013. He will contact the office in the interim with any problems.    Owens Shark ANP/GNP-BC   09/02/2013  10:49 AM

## 2013-09-02 NOTE — Patient Instructions (Signed)
[facility] Discharge Instructions for Patients Receiving Chemotherapy  Today you received the following chemotherapy agents Avastin/5 FU/Irinotecan/Leucovorin To help prevent nausea and vomiting after your treatment, we encourage you to take your nausea medication as prescribed.  If you develop nausea and vomiting that is not controlled by your nausea medication, call the clinic.   BELOW ARE SYMPTOMS THAT SHOULD BE REPORTED IMMEDIATELY:  *FEVER GREATER THAN 100.5 F  *CHILLS WITH OR WITHOUT FEVER  NAUSEA AND VOMITING THAT IS NOT CONTROLLED WITH YOUR NAUSEA MEDICATION  *UNUSUAL SHORTNESS OF BREATH  *UNUSUAL BRUISING OR BLEEDING  TENDERNESS IN MOUTH AND THROAT WITH OR WITHOUT PRESENCE OF ULCERS  *URINARY PROBLEMS  *BOWEL PROBLEMS  UNUSUAL RASH Items with * indicate a potential emergency and should be followed up as soon as possible.  Feel free to call the clinic you have any questions or concerns. The clinic phone number is [phone].

## 2013-09-04 ENCOUNTER — Ambulatory Visit: Payer: Federal, State, Local not specified - PPO

## 2013-09-04 ENCOUNTER — Ambulatory Visit (HOSPITAL_BASED_OUTPATIENT_CLINIC_OR_DEPARTMENT_OTHER): Payer: Federal, State, Local not specified - PPO

## 2013-09-04 VITALS — BP 135/86 | HR 72 | Temp 97.3°F

## 2013-09-04 DIAGNOSIS — Z5189 Encounter for other specified aftercare: Secondary | ICD-10-CM

## 2013-09-04 DIAGNOSIS — C787 Secondary malignant neoplasm of liver and intrahepatic bile duct: Secondary | ICD-10-CM

## 2013-09-04 DIAGNOSIS — C19 Malignant neoplasm of rectosigmoid junction: Secondary | ICD-10-CM

## 2013-09-04 DIAGNOSIS — C187 Malignant neoplasm of sigmoid colon: Secondary | ICD-10-CM

## 2013-09-04 DIAGNOSIS — C786 Secondary malignant neoplasm of retroperitoneum and peritoneum: Secondary | ICD-10-CM

## 2013-09-04 MED ORDER — SODIUM CHLORIDE 0.9 % IJ SOLN
10.0000 mL | INTRAMUSCULAR | Status: DC | PRN
  Administered 2013-09-04: 10 mL
  Filled 2013-09-04: qty 10

## 2013-09-04 MED ORDER — PEGFILGRASTIM INJECTION 6 MG/0.6ML
6.0000 mg | Freq: Once | SUBCUTANEOUS | Status: AC
  Administered 2013-09-04: 6 mg via SUBCUTANEOUS
  Filled 2013-09-04: qty 0.6

## 2013-09-04 MED ORDER — HEPARIN SOD (PORK) LOCK FLUSH 100 UNIT/ML IV SOLN
500.0000 [IU] | Freq: Once | INTRAVENOUS | Status: AC | PRN
  Administered 2013-09-04: 500 [IU]
  Filled 2013-09-04: qty 5

## 2013-09-08 ENCOUNTER — Encounter: Payer: Self-pay | Admitting: Oncology

## 2013-09-08 NOTE — Progress Notes (Signed)
Per [employer], Approved for Avastin asst 08/20/13-08/19/14 patid#[id number].  Will send to billing and medical records.

## 2013-09-14 ENCOUNTER — Other Ambulatory Visit: Payer: Self-pay | Admitting: Oncology

## 2013-09-17 ENCOUNTER — Telehealth: Payer: Self-pay | Admitting: Oncology

## 2013-09-17 ENCOUNTER — Other Ambulatory Visit (HOSPITAL_BASED_OUTPATIENT_CLINIC_OR_DEPARTMENT_OTHER): Payer: Federal, State, Local not specified - PPO

## 2013-09-17 ENCOUNTER — Ambulatory Visit (HOSPITAL_BASED_OUTPATIENT_CLINIC_OR_DEPARTMENT_OTHER): Payer: Federal, State, Local not specified - PPO | Admitting: Nurse Practitioner

## 2013-09-17 ENCOUNTER — Ambulatory Visit (HOSPITAL_BASED_OUTPATIENT_CLINIC_OR_DEPARTMENT_OTHER): Payer: Federal, State, Local not specified - PPO

## 2013-09-17 VITALS — BP 119/71 | HR 78 | Temp 97.8°F | Resp 18 | Ht 72.0 in | Wt 203.0 lb

## 2013-09-17 DIAGNOSIS — C787 Secondary malignant neoplasm of liver and intrahepatic bile duct: Secondary | ICD-10-CM

## 2013-09-17 DIAGNOSIS — C19 Malignant neoplasm of rectosigmoid junction: Secondary | ICD-10-CM

## 2013-09-17 DIAGNOSIS — Z5112 Encounter for antineoplastic immunotherapy: Secondary | ICD-10-CM

## 2013-09-17 LAB — CBC WITH DIFFERENTIAL/PLATELET
BASO%: 0.5 % (ref 0.0–2.0)
Basophils Absolute: 0.1 10*3/uL (ref 0.0–0.1)
EOS%: 0.8 % (ref 0.0–7.0)
Eosinophils Absolute: 0.1 10*3/uL (ref 0.0–0.5)
HEMATOCRIT: 41.7 % (ref 38.4–49.9)
HGB: 14 g/dL (ref 13.0–17.1)
LYMPH#: 1.9 10*3/uL (ref 0.9–3.3)
LYMPH%: 21.1 % (ref 14.0–49.0)
MCH: 32.3 pg (ref 27.2–33.4)
MCHC: 33.6 g/dL (ref 32.0–36.0)
MCV: 96.2 fL (ref 79.3–98.0)
MONO#: 0.8 10*3/uL (ref 0.1–0.9)
MONO%: 9.1 % (ref 0.0–14.0)
NEUT#: 6.3 10*3/uL (ref 1.5–6.5)
NEUT%: 68.5 % (ref 39.0–75.0)
Platelets: 181 10*3/uL (ref 140–400)
RBC: 4.33 10*6/uL (ref 4.20–5.82)
RDW: 16.2 % — ABNORMAL HIGH (ref 11.0–14.6)
WBC: 9.3 10*3/uL (ref 4.0–10.3)

## 2013-09-17 LAB — COMPREHENSIVE METABOLIC PANEL (CC13)
ALT: 41 U/L (ref 0–55)
AST: 41 U/L — ABNORMAL HIGH (ref 5–34)
Albumin: 3.6 g/dL (ref 3.5–5.0)
Alkaline Phosphatase: 162 U/L — ABNORMAL HIGH (ref 40–150)
Anion Gap: 10 mEq/L (ref 3–11)
BUN: 13.1 mg/dL (ref 7.0–26.0)
CALCIUM: 9.3 mg/dL (ref 8.4–10.4)
CO2: 21 meq/L — AB (ref 22–29)
CREATININE: 0.9 mg/dL (ref 0.7–1.3)
Chloride: 109 mEq/L (ref 98–109)
Glucose: 137 mg/dl (ref 70–140)
Potassium: 4.3 mEq/L (ref 3.5–5.1)
Sodium: 140 mEq/L (ref 136–145)
Total Bilirubin: 0.73 mg/dL (ref 0.20–1.20)
Total Protein: 6.9 g/dL (ref 6.4–8.3)

## 2013-09-17 LAB — UA PROTEIN, DIPSTICK - CHCC: Protein, ur: <30 mg/dL

## 2013-09-17 MED ORDER — ONDANSETRON 16 MG/50ML IVPB (CHCC)
16.0000 mg | Freq: Once | INTRAVENOUS | Status: AC
  Administered 2013-09-17: 16 mg via INTRAVENOUS

## 2013-09-17 MED ORDER — DEXAMETHASONE SODIUM PHOSPHATE 20 MG/5ML IJ SOLN
INTRAMUSCULAR | Status: AC
  Filled 2013-09-17: qty 5

## 2013-09-17 MED ORDER — SODIUM CHLORIDE 0.9 % IV SOLN
2400.0000 mg/m2 | INTRAVENOUS | Status: DC
  Administered 2013-09-17: 5300 mg via INTRAVENOUS
  Filled 2013-09-17: qty 106

## 2013-09-17 MED ORDER — FLUOROURACIL CHEMO INJECTION 2.5 GM/50ML
400.0000 mg/m2 | Freq: Once | INTRAVENOUS | Status: AC
  Administered 2013-09-17: 900 mg via INTRAVENOUS
  Filled 2013-09-17: qty 18

## 2013-09-17 MED ORDER — IRINOTECAN HCL CHEMO INJECTION 100 MG/5ML
180.0000 mg/m2 | Freq: Once | INTRAVENOUS | Status: AC
  Administered 2013-09-17: 400 mg via INTRAVENOUS
  Filled 2013-09-17: qty 20

## 2013-09-17 MED ORDER — SODIUM CHLORIDE 0.9 % IV SOLN
5.0000 mg/kg | Freq: Once | INTRAVENOUS | Status: AC
  Administered 2013-09-17: 475 mg via INTRAVENOUS
  Filled 2013-09-17: qty 19

## 2013-09-17 MED ORDER — LEUCOVORIN CALCIUM INJECTION 350 MG
400.0000 mg/m2 | Freq: Once | INTRAMUSCULAR | Status: AC
  Administered 2013-09-17: 900 mg via INTRAVENOUS
  Filled 2013-09-17: qty 45

## 2013-09-17 MED ORDER — DEXAMETHASONE SODIUM PHOSPHATE 20 MG/5ML IJ SOLN
20.0000 mg | Freq: Once | INTRAMUSCULAR | Status: AC
  Administered 2013-09-17: 20 mg via INTRAVENOUS

## 2013-09-17 MED ORDER — ONDANSETRON 16 MG/50ML IVPB (CHCC)
INTRAVENOUS | Status: AC
  Filled 2013-09-17: qty 16

## 2013-09-17 MED ORDER — SODIUM CHLORIDE 0.9 % IV SOLN
Freq: Once | INTRAVENOUS | Status: AC
  Administered 2013-09-17: 12:00:00 via INTRAVENOUS

## 2013-09-17 NOTE — Progress Notes (Signed)
[place] OFFICE PROGRESS NOTE   Diagnosis:  Metastatic colon cancer.  INTERVAL HISTORY:   [facility] Villa returns as scheduled. He completed cycle 4 FOLFIRI/Avastin on 09/02/2013. He denies nausea/vomiting. Mouth became sore. No definite ulcerations. No diarrhea. He continues to have intermittent constipation. He is managing the constipation with diet. He notes blood with nose blowing. No other bleeding. Specifically no rectal bleeding. He denies abdominal pain. No shortness of breath or chest pain. No leg swelling or calf pain.  Objective:  Vital signs in last 24 hours:  Blood pressure 119/71, pulse 78, temperature 97.8 F (36.6 C), resp. rate 18, height 6' (1.829 m), weight 203 lb (92.08 kg), SpO2 98.00%.    HEENT: No thrush or ulcerations. Resp: Lungs clear. Cardio: Regular cardiac rhythm. GI: Abdomen soft and nontender. No hepatomegaly. Vascular: No leg edema. Calves nontender.   Port-A-Cath site is without erythema.    Lab Results:  Lab Results  Component Value Date   WBC 9.3 09/17/2013   HGB 14.0 09/17/2013   HCT 41.7 09/17/2013   MCV 96.2 09/17/2013   PLT 181 09/17/2013   NEUTROABS 6.3 09/17/2013    Imaging:  No results found.  Medications: I have reviewed the patient's current medications.  Assessment/Plan: 1. Metastatic colon cancer status post low anterior resection on 12/22/2011 (T4, N2, M1). K-ras wild-type (extended K-ras testing with no K-ras, NRAS, or BRAF mutation identified). Initiation of CAPOX/Avastin on 01/23/2012. The CEA was in normal range on 05/28/2012. Restaging CT 04/12/2012 after 4 cycles of CAPOX/Avastin confirmed improvement in the hepatic metastases. He completed cycle 7 CAPOX/Avastin beginning 06/18/2012. He completed cycle 8 systemic therapy with Xeloda and Avastin (oxaliplatin discontinued) beginning 07/09/2012. He completed cycle 9 beginning 07/30/2012 and cycle 10 beginning on 08/20/2012. Restaging CT evaluation 08/13/2012 with further  improvement in liver metastases. He began another cycle of Xeloda on 09/10/2012. He declined further Avastin. He completed a cycle of dose reduced Xeloda beginning on 10/01/2012 and was then maintained off of systemic therapy. Systemic therapy resumed with Xeloda/Avastin 12/31/2012 (completed 3 days of Xeloda with cycle 1). The Xeloda was dose reduced with the cycle of chemotherapy beginning 04/01/2013 Restaging CT 04/14/2013, compared to 08/13/2012 revealed a mixed response in the liver  MRI of the abdomen 05/02/2013 confirmed a single calcified left hepatic lobe lesion and multiple peripherally enhancing right hepatic lobe lesions.  Diagnostic laparoscopy 06/10/2013 with findings of adhesions to midline; numerous lesions seen on the right liver with the largest on the dome; peritoneal nodules near right liver mass; small firm areas in left liver; small left liver. 2 biopsies were obtained from the left liver and a peritoneal nodule seen over the right sided liver lesion was also biopsied. Anterior liver core biopsy showed benign liver tissue with mild steatosis; no evidence of malignancy. Posterior liver wedge biopsy showed benign liver tissue; no evidence of malignancy. 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  CEA 06/17/2013 increased at 15.5, 24 on 07/22/2013  Cycle 1 of FOLFIRI/Avastin 07/22/2013.  Cycle 2 FOLFIRI/Avastin 08/05/2013.  Cycle 3 FOLFIRI/Avastin 08/20/2013.  CEA improved at 20.4 on 08/20/2013. Cycle 4 FOLFIRI/Avastin 09/02/2013. 2. Microcytic anemia-resolved. 3. History of weight loss secondary to metastatic colon cancer.  4. Status post Port-A-Cath placement 01/10/2012. 5. Hand-foot syndrome secondary to Xeloda. The Xeloda was dose reduced to 1000 mg twice daily beginning with cycle 4 CAPOX. The hand-foot symptoms progressed. The Xeloda was further dose reduced beginning with treatment on 04/01/2013. Most recent Xeloda dose 1000  mg twice daily. 6. Glaucoma-now on medical therapy. 7. Neutropenia-Neulasta will be added with cycle 2 FOLFIRI. 8. Rectal bleeding with bowel movements-? Hemorrhoid bleeding,? Related to Avastin. He reports no further bleeding. He has contacted Dr. Paulita Fujita to discuss a colonoscopy. 9. Constipation. He is managing the constipation with dietary changes.   Disposition: He appears stable. He has completed 4 cycles of FOLFIRI/Avastin. We will followup on the CEA from today. Plan to proceed with cycle 5 today as scheduled.  He will return for a followup visit and cycle 6 FOLFIRI/Avastin on 09/30/2013. He will contact the office in the interim with any problems.  Plan reviewed with Dr. Benay Spice.    Owens Shark ANP/GNP-BC   09/17/2013  11:08 AM

## 2013-09-17 NOTE — Telephone Encounter (Signed)
gv adn printed appt sched and avs for pt for May and June....sed added tx. °

## 2013-09-18 LAB — CEA: CEA: 16.7 ng/mL — ABNORMAL HIGH (ref 0.0–5.0)

## 2013-09-19 ENCOUNTER — Ambulatory Visit (HOSPITAL_BASED_OUTPATIENT_CLINIC_OR_DEPARTMENT_OTHER): Payer: Federal, State, Local not specified - PPO

## 2013-09-19 ENCOUNTER — Other Ambulatory Visit: Payer: Self-pay

## 2013-09-19 ENCOUNTER — Ambulatory Visit: Payer: Federal, State, Local not specified - PPO

## 2013-09-19 VITALS — BP 140/80 | HR 72

## 2013-09-19 DIAGNOSIS — C787 Secondary malignant neoplasm of liver and intrahepatic bile duct: Secondary | ICD-10-CM

## 2013-09-19 DIAGNOSIS — C19 Malignant neoplasm of rectosigmoid junction: Secondary | ICD-10-CM

## 2013-09-19 DIAGNOSIS — Z5189 Encounter for other specified aftercare: Secondary | ICD-10-CM

## 2013-09-19 MED ORDER — PEGFILGRASTIM INJECTION 6 MG/0.6ML
6.0000 mg | Freq: Once | SUBCUTANEOUS | Status: AC
  Administered 2013-09-19: 6 mg via SUBCUTANEOUS
  Filled 2013-09-19: qty 0.6

## 2013-09-19 MED ORDER — SODIUM CHLORIDE 0.9 % IJ SOLN
10.0000 mL | INTRAMUSCULAR | Status: DC | PRN
  Administered 2013-09-19: 10 mL
  Filled 2013-09-19: qty 10

## 2013-09-19 MED ORDER — HEPARIN SOD (PORK) LOCK FLUSH 100 UNIT/ML IV SOLN
500.0000 [IU] | Freq: Once | INTRAVENOUS | Status: AC | PRN
  Administered 2013-09-19: 500 [IU]
  Filled 2013-09-19: qty 5

## 2013-09-28 ENCOUNTER — Other Ambulatory Visit: Payer: Self-pay | Admitting: Oncology

## 2013-09-30 ENCOUNTER — Ambulatory Visit (HOSPITAL_BASED_OUTPATIENT_CLINIC_OR_DEPARTMENT_OTHER): Payer: Federal, State, Local not specified - PPO

## 2013-09-30 ENCOUNTER — Telehealth: Payer: Self-pay | Admitting: Oncology

## 2013-09-30 ENCOUNTER — Ambulatory Visit (HOSPITAL_BASED_OUTPATIENT_CLINIC_OR_DEPARTMENT_OTHER): Payer: Federal, State, Local not specified - PPO | Admitting: Oncology

## 2013-09-30 ENCOUNTER — Other Ambulatory Visit (HOSPITAL_BASED_OUTPATIENT_CLINIC_OR_DEPARTMENT_OTHER): Payer: Federal, State, Local not specified - PPO

## 2013-09-30 VITALS — BP 130/81 | HR 70 | Temp 97.4°F | Resp 18

## 2013-09-30 VITALS — BP 132/84 | HR 71 | Temp 96.7°F | Resp 18 | Ht 72.0 in | Wt 203.3 lb

## 2013-09-30 DIAGNOSIS — C189 Malignant neoplasm of colon, unspecified: Secondary | ICD-10-CM

## 2013-09-30 DIAGNOSIS — C787 Secondary malignant neoplasm of liver and intrahepatic bile duct: Secondary | ICD-10-CM

## 2013-09-30 DIAGNOSIS — C19 Malignant neoplasm of rectosigmoid junction: Secondary | ICD-10-CM

## 2013-09-30 DIAGNOSIS — Z5111 Encounter for antineoplastic chemotherapy: Secondary | ICD-10-CM

## 2013-09-30 LAB — COMPREHENSIVE METABOLIC PANEL (CC13)
ALT: 32 U/L (ref 0–55)
AST: 32 U/L (ref 5–34)
Albumin: 3.7 g/dL (ref 3.5–5.0)
Alkaline Phosphatase: 145 U/L (ref 40–150)
Anion Gap: 13 mEq/L — ABNORMAL HIGH (ref 3–11)
BUN: 18.5 mg/dL (ref 7.0–26.0)
CHLORIDE: 105 meq/L (ref 98–109)
CO2: 20 mEq/L — ABNORMAL LOW (ref 22–29)
CREATININE: 1 mg/dL (ref 0.7–1.3)
Calcium: 9.4 mg/dL (ref 8.4–10.4)
Glucose: 107 mg/dl (ref 70–140)
Potassium: 4.2 mEq/L (ref 3.5–5.1)
SODIUM: 139 meq/L (ref 136–145)
TOTAL PROTEIN: 6.8 g/dL (ref 6.4–8.3)
Total Bilirubin: 0.88 mg/dL (ref 0.20–1.20)

## 2013-09-30 LAB — CBC WITH DIFFERENTIAL/PLATELET
BASO%: 0.7 % (ref 0.0–2.0)
Basophils Absolute: 0.1 10*3/uL (ref 0.0–0.1)
EOS%: 1.9 % (ref 0.0–7.0)
Eosinophils Absolute: 0.1 10*3/uL (ref 0.0–0.5)
HEMATOCRIT: 39.5 % (ref 38.4–49.9)
HGB: 13.3 g/dL (ref 13.0–17.1)
LYMPH%: 33.6 % (ref 14.0–49.0)
MCH: 32 pg (ref 27.2–33.4)
MCHC: 33.7 g/dL (ref 32.0–36.0)
MCV: 95.2 fL (ref 79.3–98.0)
MONO#: 1.1 10*3/uL — AB (ref 0.1–0.9)
MONO%: 15.4 % — ABNORMAL HIGH (ref 0.0–14.0)
NEUT#: 3.4 10*3/uL (ref 1.5–6.5)
NEUT%: 48.4 % (ref 39.0–75.0)
Platelets: 218 10*3/uL (ref 140–400)
RBC: 4.15 10*6/uL — AB (ref 4.20–5.82)
RDW: 16.8 % — AB (ref 11.0–14.6)
WBC: 7 10*3/uL (ref 4.0–10.3)
lymph#: 2.4 10*3/uL (ref 0.9–3.3)

## 2013-09-30 MED ORDER — FLUOROURACIL CHEMO INJECTION 2.5 GM/50ML
400.0000 mg/m2 | Freq: Once | INTRAVENOUS | Status: AC
  Administered 2013-09-30: 900 mg via INTRAVENOUS
  Filled 2013-09-30: qty 18

## 2013-09-30 MED ORDER — DEXAMETHASONE SODIUM PHOSPHATE 20 MG/5ML IJ SOLN
INTRAMUSCULAR | Status: AC
  Filled 2013-09-30: qty 5

## 2013-09-30 MED ORDER — BEVACIZUMAB CHEMO INJECTION 400 MG/16ML
5.0000 mg/kg | Freq: Once | INTRAVENOUS | Status: AC
  Administered 2013-09-30: 475 mg via INTRAVENOUS
  Filled 2013-09-30: qty 19

## 2013-09-30 MED ORDER — SODIUM CHLORIDE 0.9 % IV SOLN
2400.0000 mg/m2 | INTRAVENOUS | Status: DC
  Administered 2013-09-30: 5300 mg via INTRAVENOUS
  Filled 2013-09-30: qty 106

## 2013-09-30 MED ORDER — LEUCOVORIN CALCIUM INJECTION 350 MG
400.0000 mg/m2 | Freq: Once | INTRAMUSCULAR | Status: AC
  Administered 2013-09-30: 900 mg via INTRAVENOUS
  Filled 2013-09-30: qty 45

## 2013-09-30 MED ORDER — SODIUM CHLORIDE 0.9 % IV SOLN
Freq: Once | INTRAVENOUS | Status: AC
  Administered 2013-09-30: 10:00:00 via INTRAVENOUS

## 2013-09-30 MED ORDER — ATROPINE SULFATE 1 MG/ML IJ SOLN: 0.5000 mg | Freq: Once | INTRAMUSCULAR | Status: DC | PRN

## 2013-09-30 MED ORDER — DEXAMETHASONE SODIUM PHOSPHATE 20 MG/5ML IJ SOLN
20.0000 mg | Freq: Once | INTRAMUSCULAR | Status: AC
  Administered 2013-09-30: 20 mg via INTRAVENOUS

## 2013-09-30 MED ORDER — ONDANSETRON 16 MG/50ML IVPB (CHCC)
16.0000 mg | Freq: Once | INTRAVENOUS | Status: AC
  Administered 2013-09-30: 16 mg via INTRAVENOUS

## 2013-09-30 MED ORDER — IRINOTECAN HCL CHEMO INJECTION 100 MG/5ML
180.0000 mg/m2 | Freq: Once | INTRAVENOUS | Status: AC
  Administered 2013-09-30: 400 mg via INTRAVENOUS
  Filled 2013-09-30: qty 20

## 2013-09-30 MED ORDER — ONDANSETRON 16 MG/50ML IVPB (CHCC)
INTRAVENOUS | Status: AC
  Filled 2013-09-30: qty 16

## 2013-09-30 NOTE — Telephone Encounter (Signed)
gv adn printred appt sched and avs for pt for May and June....sed gv barium

## 2013-09-30 NOTE — Patient Instructions (Signed)
[place] Discharge Instructions for Patients Receiving Chemotherapy  Today you received the following chemotherapy agents Avastin, Leucovorin, Camptosar and Adrucil.   To help prevent nausea and vomiting after your treatment, we encourage you to take your nausea medication as prescribed.    If you develop nausea and vomiting that is not controlled by your nausea medication, call the clinic.   BELOW ARE SYMPTOMS THAT SHOULD BE REPORTED IMMEDIATELY:  *FEVER GREATER THAN 100.5 F  *CHILLS WITH OR WITHOUT FEVER  NAUSEA AND VOMITING THAT IS NOT CONTROLLED WITH YOUR NAUSEA MEDICATION  *UNUSUAL SHORTNESS OF BREATH  *UNUSUAL BRUISING OR BLEEDING  TENDERNESS IN MOUTH AND THROAT WITH OR WITHOUT PRESENCE OF ULCERS  *URINARY PROBLEMS  *BOWEL PROBLEMS  UNUSUAL RASH Items with * indicate a potential emergency and should be followed up as soon as possible.  Feel free to call the clinic you have any questions or concerns. The clinic phone number is [phone].

## 2013-09-30 NOTE — Progress Notes (Signed)
[place] OFFICE PROGRESS NOTE   Diagnosis: Colon cancer  INTERVAL HISTORY:   He completed another cycle of FOLFIRI/Avastin 09/17/2013. He reports increased malaise following chemotherapy. His tongue was "sore ". He has tingling in the fingers and toes. The right upper quadrant discomfort has improved. The rectal bleeding has resolved. He has minimal bleeding with bowel movements. He reports a colonoscopy with Dr. Paulita Fujita was canceled.  Objective:  Vital signs in last 24 hours:  Blood pressure 132/84, pulse 71, temperature 96.7 F (35.9 C), temperature source Oral, resp. rate 18, height 6' (1.829 m), weight 203 lb 4.8 oz (92.216 kg).    HEENT: No thrush or ulcers Resp: Lungs clear bilaterally Cardio: Regular rate and rhythm GI: No hepatomegaly, nontender Vascular: No leg edema  Skin: Hyperpigmentation with dry thickening at the palms and soles.   Portacath/PICC-without erythema  Lab Results:  Lab Results  Component Value Date   WBC 7.0 09/30/2013   HGB 13.3 09/30/2013   HCT 39.5 09/30/2013   MCV 95.2 09/30/2013   PLT 218 09/30/2013   NEUTROABS 3.4 09/30/2013     Lab Results  Component Value Date   CEA 16.7* 09/17/2013   Medications: I have reviewed the patient's current medications.  Assessment/Plan: 1. Metastatic colon cancer status post low anterior resection on 12/22/2011 (T4, N2, M1). K-ras wild-type (extended K-ras testing with no K-ras, NRAS, or BRAF mutation identified). Initiation of CAPOX/Avastin on 01/23/2012. The CEA was in normal range on 05/28/2012. Restaging CT 04/12/2012 after 4 cycles of CAPOX/Avastin confirmed improvement in the hepatic metastases. He completed cycle 7 CAPOX/Avastin beginning 06/18/2012. He completed cycle 8 systemic therapy with Xeloda and Avastin (oxaliplatin discontinued) beginning 07/09/2012. He completed cycle 9 beginning 07/30/2012 and cycle 10 beginning on 08/20/2012. Restaging CT evaluation 08/13/2012 with further  improvement in liver metastases. He began another cycle of Xeloda on 09/10/2012. He declined further Avastin. He completed a cycle of dose reduced Xeloda beginning on 10/01/2012 and was then maintained off of systemic therapy. Systemic therapy resumed with Xeloda/Avastin 12/31/2012 (completed 3 days of Xeloda with cycle 1). The Xeloda was dose reduced with the cycle of chemotherapy beginning 04/01/2013 Restaging CT 04/14/2013, compared to 08/13/2012 revealed a mixed response in the liver  MRI of the abdomen 05/02/2013 confirmed a single calcified left hepatic lobe lesion and multiple peripherally enhancing right hepatic lobe lesions.  Diagnostic laparoscopy 06/10/2013 with findings of adhesions to midline; numerous lesions seen on the right liver with the largest on the dome; peritoneal nodules near right liver mass; small firm areas in left liver; small left liver. 2 biopsies were obtained from the left liver and a peritoneal nodule seen over the right sided liver lesion was also biopsied. Anterior liver core biopsy showed benign liver tissue with mild steatosis; no evidence of malignancy. Posterior liver wedge biopsy showed benign liver tissue; no evidence of malignancy. 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  CEA 06/17/2013 increased at 15.5, 24 on 07/22/2013  Cycle 1 of FOLFIRI/Avastin 07/22/2013.  Cycle 2 FOLFIRI/Avastin 08/05/2013.  Cycle 3 FOLFIRI/Avastin 08/20/2013.  CEA improved at 20.4 on 08/20/2013.  Cycle 4 FOLFIRI/Avastin 09/02/2013. Cycle 5 FOLFIRI/Avastin 09/17/2013 2. Microcytic anemia-resolved. 3. History of weight loss secondary to metastatic colon cancer.  4. Status post Port-A-Cath placement 01/10/2012. 5. Hand-foot syndrome secondary to Xeloda. The Xeloda was dose reduced to 1000 mg twice daily beginning with cycle 4 CAPOX. The hand-foot symptoms progressed. The Xeloda was further dose reduced beginning with treatment on  04/01/2013.  Most recent Xeloda dose 1000 mg twice daily. 6. Glaucoma-now on medical therapy. 7. Neutropenia-Neulasta will be added with cycle 2 FOLFIRI. 8. Rectal bleeding with bowel movements-? Hemorrhoid bleeding,? Related to Avastin. He reports the bleeding has improved   Disposition:  Mr. Genter appears stable. He will complete cycle 6 FOLFIRI/Avastin today. He will undergo a restaging CT evaluation prior to an office visit in 2 weeks. We will refer him to Dr. Paulita Fujita for an endoscopy if he has recurrent rectal bleeding.  Ladell Pier, MD  09/30/2013  9:00 AM

## 2013-10-02 ENCOUNTER — Ambulatory Visit (HOSPITAL_BASED_OUTPATIENT_CLINIC_OR_DEPARTMENT_OTHER): Payer: Federal, State, Local not specified - PPO

## 2013-10-02 ENCOUNTER — Ambulatory Visit: Payer: Federal, State, Local not specified - PPO

## 2013-10-02 VITALS — BP 125/74 | HR 75 | Temp 97.8°F

## 2013-10-02 DIAGNOSIS — C787 Secondary malignant neoplasm of liver and intrahepatic bile duct: Secondary | ICD-10-CM

## 2013-10-02 DIAGNOSIS — D709 Neutropenia, unspecified: Secondary | ICD-10-CM

## 2013-10-02 DIAGNOSIS — C19 Malignant neoplasm of rectosigmoid junction: Secondary | ICD-10-CM

## 2013-10-02 MED ORDER — HEPARIN SOD (PORK) LOCK FLUSH 100 UNIT/ML IV SOLN
500.0000 [IU] | Freq: Once | INTRAVENOUS | Status: AC | PRN
  Administered 2013-10-02: 500 [IU]
  Filled 2013-10-02: qty 5

## 2013-10-02 MED ORDER — SODIUM CHLORIDE 0.9 % IJ SOLN
10.0000 mL | INTRAMUSCULAR | Status: DC | PRN
  Administered 2013-10-02: 10 mL
  Filled 2013-10-02: qty 10

## 2013-10-02 MED ORDER — PEGFILGRASTIM INJECTION 6 MG/0.6ML
6.0000 mg | Freq: Once | SUBCUTANEOUS | Status: AC
  Administered 2013-10-02: 6 mg via SUBCUTANEOUS
  Filled 2013-10-02: qty 0.6

## 2013-10-10 ENCOUNTER — Encounter (HOSPITAL_COMMUNITY): Payer: Self-pay

## 2013-10-10 ENCOUNTER — Ambulatory Visit (HOSPITAL_COMMUNITY)
Admission: RE | Admit: 2013-10-10 | Discharge: 2013-10-10 | Disposition: A | Discharge status: Home / Self Care | Payer: Federal, State, Local not specified - PPO | Source: Ambulatory Visit | Attending: Oncology | Admitting: Oncology

## 2013-10-10 DIAGNOSIS — C189 Malignant neoplasm of colon, unspecified: Secondary | ICD-10-CM | POA: Insufficient documentation

## 2013-10-10 DIAGNOSIS — M47814 Spondylosis without myelopathy or radiculopathy, thoracic region: Secondary | ICD-10-CM | POA: Insufficient documentation

## 2013-10-10 DIAGNOSIS — K429 Umbilical hernia without obstruction or gangrene: Secondary | ICD-10-CM | POA: Insufficient documentation

## 2013-10-10 DIAGNOSIS — M47817 Spondylosis without myelopathy or radiculopathy, lumbosacral region: Secondary | ICD-10-CM | POA: Insufficient documentation

## 2013-10-10 DIAGNOSIS — C787 Secondary malignant neoplasm of liver and intrahepatic bile duct: Secondary | ICD-10-CM | POA: Insufficient documentation

## 2013-10-10 MED ORDER — IOHEXOL 300 MG/ML  SOLN
100.0000 mL | Freq: Once | INTRAMUSCULAR | Status: AC | PRN
  Administered 2013-10-10: 100 mL via INTRAVENOUS

## 2013-10-14 ENCOUNTER — Ambulatory Visit (HOSPITAL_BASED_OUTPATIENT_CLINIC_OR_DEPARTMENT_OTHER): Payer: Federal, State, Local not specified - PPO | Admitting: Oncology

## 2013-10-14 ENCOUNTER — Ambulatory Visit: Payer: Federal, State, Local not specified - PPO

## 2013-10-14 ENCOUNTER — Other Ambulatory Visit (HOSPITAL_BASED_OUTPATIENT_CLINIC_OR_DEPARTMENT_OTHER): Payer: Federal, State, Local not specified - PPO

## 2013-10-14 ENCOUNTER — Telehealth: Payer: Self-pay | Admitting: Oncology

## 2013-10-14 VITALS — BP 128/86 | HR 63 | Temp 97.4°F | Resp 18 | Ht 72.0 in | Wt 198.6 lb

## 2013-10-14 DIAGNOSIS — C19 Malignant neoplasm of rectosigmoid junction: Secondary | ICD-10-CM

## 2013-10-14 DIAGNOSIS — C189 Malignant neoplasm of colon, unspecified: Secondary | ICD-10-CM

## 2013-10-14 DIAGNOSIS — L270 Generalized skin eruption due to drugs and medicaments taken internally: Secondary | ICD-10-CM

## 2013-10-14 DIAGNOSIS — C787 Secondary malignant neoplasm of liver and intrahepatic bile duct: Secondary | ICD-10-CM

## 2013-10-14 DIAGNOSIS — D709 Neutropenia, unspecified: Secondary | ICD-10-CM

## 2013-10-14 LAB — CBC WITH DIFFERENTIAL/PLATELET
BASO%: 0.8 % (ref 0.0–2.0)
BASOS ABS: 0.1 10*3/uL (ref 0.0–0.1)
EOS%: 1.1 % (ref 0.0–7.0)
Eosinophils Absolute: 0.1 10*3/uL (ref 0.0–0.5)
HEMATOCRIT: 43.3 % (ref 38.4–49.9)
HEMOGLOBIN: 14.3 g/dL (ref 13.0–17.1)
LYMPH%: 20.3 % (ref 14.0–49.0)
MCH: 32.3 pg (ref 27.2–33.4)
MCHC: 33 g/dL (ref 32.0–36.0)
MCV: 97.7 fL (ref 79.3–98.0)
MONO#: 1.1 10*3/uL — ABNORMAL HIGH (ref 0.1–0.9)
MONO%: 10.7 % (ref 0.0–14.0)
NEUT#: 6.9 10*3/uL — ABNORMAL HIGH (ref 1.5–6.5)
NEUT%: 67.1 % (ref 39.0–75.0)
Platelets: 211 10*3/uL (ref 140–400)
RBC: 4.43 10*6/uL (ref 4.20–5.82)
RDW: 18.2 % — ABNORMAL HIGH (ref 11.0–14.6)
WBC: 10.3 10*3/uL (ref 4.0–10.3)
lymph#: 2.1 10*3/uL (ref 0.9–3.3)

## 2013-10-14 LAB — COMPREHENSIVE METABOLIC PANEL (CC13)
ALT: 28 U/L (ref 0–55)
AST: 35 U/L — AB (ref 5–34)
Albumin: 3.8 g/dL (ref 3.5–5.0)
Alkaline Phosphatase: 148 U/L (ref 40–150)
Anion Gap: 15 mEq/L — ABNORMAL HIGH (ref 3–11)
BUN: 13.9 mg/dL (ref 7.0–26.0)
CHLORIDE: 106 meq/L (ref 98–109)
CO2: 20 mEq/L — ABNORMAL LOW (ref 22–29)
CREATININE: 1 mg/dL (ref 0.7–1.3)
Calcium: 9.3 mg/dL (ref 8.4–10.4)
GLUCOSE: 102 mg/dL (ref 70–140)
Potassium: 4.3 mEq/L (ref 3.5–5.1)
Sodium: 141 mEq/L (ref 136–145)
Total Bilirubin: 1.12 mg/dL (ref 0.20–1.20)
Total Protein: 6.9 g/dL (ref 6.4–8.3)

## 2013-10-14 NOTE — Progress Notes (Signed)
[facility] OFFICE PROGRESS NOTE   Diagnosis: Colon cancer  INTERVAL HISTORY:   [name] returns as scheduled. He completed another cycle of FOLFIRI/Avastin beginning 09/30/2013. He had nausea following chemotherapy. No diarrhea. The hands and feet are stable. He had mouth soreness following this cycle of chemotherapy and increased "spitting ". He has noted increased discomfort in the right abdomen.  Objective:  Vital signs in last 24 hours:  There were no vitals taken for this visit.    HEENT: No thrush or ulcers, mild angular chelitis Lymphatics: No cervical, supraclavicular, axillary, or inguinal nodes Resp: Lungs clear bilaterally Cardio: Regular rate and rhythm GI: No hepatomegaly, mild tenderness in the right upper abdomen, no mass Vascular: No leg edema  Skin: Hyperpigmentation and skin thickening over the palms   Portacath/PICC-without erythema  Lab Results:  Lab Results  Component Value Date   WBC 7.0 09/30/2013   HGB 13.3 09/30/2013   HCT 39.5 09/30/2013   MCV 95.2 09/30/2013   PLT 218 09/30/2013   NEUTROABS 3.4 09/30/2013    Lab Results  Component Value Date   NA 139 09/30/2013    Lab Results  Component Value Date   CEA 16.7* 09/17/2013    Imaging:  CT the abdomen and pelvis 10/10/2013, compared to an MRI 05/01/2013 and a CT 04/14/2013-increased size of metastatic liver lesions, new left lateral liver lesion Medications: I have reviewed the patient's current medications.  Assessment/Plan: 1. Metastatic colon cancer status post low anterior resection on 12/22/2011 (T4, N2, M1). K-ras wild-type (extended K-ras testing with no K-ras, NRAS, or BRAF mutation identified). Initiation of CAPOX/Avastin on 01/23/2012. The CEA was in normal range on 05/28/2012. Restaging CT 04/12/2012 after 4 cycles of CAPOX/Avastin confirmed improvement in the hepatic metastases. He completed cycle 7 CAPOX/Avastin beginning 06/18/2012. He completed cycle 8 systemic  therapy with Xeloda and Avastin (oxaliplatin discontinued) beginning 07/09/2012. He completed cycle 9 beginning 07/30/2012 and cycle 10 beginning on 08/20/2012. Restaging CT evaluation 08/13/2012 with further improvement in liver metastases. He began another cycle of Xeloda on 09/10/2012. He declined further Avastin. He completed a cycle of dose reduced Xeloda beginning on 10/01/2012 and was then maintained off of systemic therapy. Systemic therapy resumed with Xeloda/Avastin 12/31/2012 (completed 3 days of Xeloda with cycle 1). The Xeloda was dose reduced with the cycle of chemotherapy beginning 04/01/2013 Restaging CT 04/14/2013, compared to 08/13/2012 revealed a mixed response in the liver  MRI of the abdomen 05/02/2013 confirmed a single calcified left hepatic lobe lesion and multiple peripherally enhancing right hepatic lobe lesions.  Diagnostic laparoscopy 06/10/2013 with findings of adhesions to midline; numerous lesions seen on the right liver with the largest on the dome; peritoneal nodules near right liver mass; small firm areas in left liver; small left liver. 2 biopsies were obtained from the left liver and a peritoneal nodule seen over the right sided liver lesion was also biopsied. Anterior liver core biopsy showed benign liver tissue with mild steatosis; no evidence of malignancy. Posterior liver wedge biopsy showed benign liver tissue; no evidence of malignancy. 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  CEA 06/17/2013 increased at 15.5, 24 on 07/22/2013  Cycle 1 of FOLFIRI/Avastin 07/22/2013.  Cycle 2 FOLFIRI/Avastin 08/05/2013.  Cycle 3 FOLFIRI/Avastin 08/20/2013.  CEA improved at 20.4 on 08/20/2013.  Cycle 4 FOLFIRI/Avastin 09/02/2013.  Cycle 5 FOLFIRI/Avastin 09/17/2013 Cycle 6 FOLFIRI/Avastin 09/30/2013 CT 10/10/2013 with progression of liver metastases 2. Microcytic anemia-resolved. 3. History of weight loss secondary to  metastatic  colon cancer.  4. Status post Port-A-Cath placement 01/10/2012. 5. Hand-foot syndrome secondary to Xeloda. The Xeloda was dose reduced to 1000 mg twice daily beginning with cycle 4 CAPOX. The hand-foot symptoms progressed. The Xeloda was further dose reduced beginning with treatment on 04/01/2013. Most recent Xeloda dose 1000 mg twice daily. 6. Glaucoma-now on medical therapy. 7. Neutropenia-Neulasta was added with cycle 2 FOLFIRI. 8. History of Rectal bleeding with bowel movements-? Hemorrhoid bleeding,? Related to Avastin.    Disposition:  Lawrence Villa has metastatic colon cancer. I reviewed the restaging CT images with him today. The CEA is stable, but the CT indicates disease progression in the liver. He did not start the FOLFIRI/Avastin for approximately 3 months following the comparison imaging studies and it is possible the tumor was worse in the interim. I discussed this possibility with Lawrence Villa. However it is unlikely he is having a significant response to the FOLFIRI/Avastin.  We decided to change therapy. We discussed irinotecan/panitumumab versus repeat treatment with an oxaliplatin based therapy versus referral to consider a clinical trial. We also discussed radioembolization. He does not wish to seek a second opinion at present. The plan is to begin irinotecan/panitumumab. I reviewed the potential toxicities associated with panitumumab including the chance for diarrhea, rash, and an allergic reaction. He was given reading materials on panitumumab. Lawrence Villa agrees to proceed.  He will be scheduled for a first treatment with irinotecan/panitumumab on 10/21/2013. I will present his case at the GI tumor conference within the next few weeks to discuss the possibility of hepatic directed therapy to be given in addition to systemic therapy.  Ladell Pier, MD  10/14/2013  12:27 PM

## 2013-10-14 NOTE — Telephone Encounter (Signed)
gv adn printed appt sched and avs for pt fro June....sed added tx.

## 2013-10-15 LAB — CEA: CEA: 13.2 ng/mL — ABNORMAL HIGH (ref 0.0–5.0)

## 2013-10-16 ENCOUNTER — Telehealth: Payer: Self-pay | Admitting: *Deleted

## 2013-10-16 ENCOUNTER — Other Ambulatory Visit: Payer: Self-pay | Admitting: *Deleted

## 2013-10-16 MED ORDER — MINOCYCLINE HCL 100 MG PO CAPS
100.0000 mg | ORAL_CAPSULE | Freq: Two times a day (BID) | ORAL | Status: DC
Start: 2013-10-20 — End: 2013-12-20

## 2013-10-16 NOTE — Telephone Encounter (Signed)
Pt called, CEA results given. He is requesting cream to be sent to his pharmacy in preparation for beginning Vectibix tx.

## 2013-10-16 NOTE — Telephone Encounter (Signed)
Message copied by Brien Few on Thu Oct 16, 2013  8:42 AM ------      Message from: Betsy Coder B      Created: Thu Oct 16, 2013  7:15 AM       Please call patient, cea is stable ------

## 2013-10-16 NOTE — Telephone Encounter (Signed)
Left message on voicemail informing pt that Minocycline will be sent to pharmacy. Begin 10/20/13 (day before chemo).

## 2013-10-19 ENCOUNTER — Other Ambulatory Visit: Payer: Self-pay | Admitting: Oncology

## 2013-10-21 ENCOUNTER — Other Ambulatory Visit: Payer: Federal, State, Local not specified - PPO

## 2013-10-21 ENCOUNTER — Ambulatory Visit (HOSPITAL_BASED_OUTPATIENT_CLINIC_OR_DEPARTMENT_OTHER): Payer: Federal, State, Local not specified - PPO

## 2013-10-21 ENCOUNTER — Telehealth: Payer: Self-pay | Admitting: *Deleted

## 2013-10-21 VITALS — BP 130/86 | HR 71 | Temp 97.8°F | Resp 18

## 2013-10-21 DIAGNOSIS — Z5111 Encounter for antineoplastic chemotherapy: Secondary | ICD-10-CM

## 2013-10-21 DIAGNOSIS — C19 Malignant neoplasm of rectosigmoid junction: Secondary | ICD-10-CM

## 2013-10-21 DIAGNOSIS — C189 Malignant neoplasm of colon, unspecified: Secondary | ICD-10-CM

## 2013-10-21 DIAGNOSIS — Z5112 Encounter for antineoplastic immunotherapy: Secondary | ICD-10-CM

## 2013-10-21 DIAGNOSIS — C787 Secondary malignant neoplasm of liver and intrahepatic bile duct: Secondary | ICD-10-CM

## 2013-10-21 MED ORDER — DEXAMETHASONE SODIUM PHOSPHATE 10 MG/ML IJ SOLN
INTRAMUSCULAR | Status: AC
  Filled 2013-10-21: qty 1

## 2013-10-21 MED ORDER — SODIUM CHLORIDE 0.9 % IJ SOLN
10.0000 mL | INTRAMUSCULAR | Status: DC | PRN
  Administered 2013-10-21: 10 mL
  Filled 2013-10-21: qty 10

## 2013-10-21 MED ORDER — ONDANSETRON 8 MG/50ML IVPB (CHCC)
8.0000 mg | Freq: Once | INTRAVENOUS | Status: AC
  Administered 2013-10-21: 8 mg via INTRAVENOUS

## 2013-10-21 MED ORDER — SODIUM CHLORIDE 0.9 % IV SOLN
Freq: Once | INTRAVENOUS | Status: AC
  Administered 2013-10-21: 12:00:00 via INTRAVENOUS

## 2013-10-21 MED ORDER — ONDANSETRON 8 MG/NS 50 ML IVPB
INTRAVENOUS | Status: AC
  Filled 2013-10-21: qty 8

## 2013-10-21 MED ORDER — DEXAMETHASONE SODIUM PHOSPHATE 10 MG/ML IJ SOLN
10.0000 mg | Freq: Once | INTRAMUSCULAR | Status: AC
  Administered 2013-10-21: 10 mg via INTRAVENOUS

## 2013-10-21 MED ORDER — PANITUMUMAB CHEMO INJECTION 100 MG/5ML
6.0000 mg/kg | Freq: Once | INTRAVENOUS | Status: AC
  Administered 2013-10-21: 540 mg via INTRAVENOUS
  Filled 2013-10-21: qty 27

## 2013-10-21 MED ORDER — DEXTROSE 5 % IV SOLN
178.0000 mg/m2 | Freq: Once | INTRAVENOUS | Status: AC
  Administered 2013-10-21: 380 mg via INTRAVENOUS
  Filled 2013-10-21: qty 19

## 2013-10-21 MED ORDER — HEPARIN SOD (PORK) LOCK FLUSH 100 UNIT/ML IV SOLN
500.0000 [IU] | Freq: Once | INTRAVENOUS | Status: AC | PRN
  Administered 2013-10-21: 500 [IU]
  Filled 2013-10-21: qty 5

## 2013-10-21 NOTE — Patient Instructions (Signed)
[place] Discharge Instructions for Patients Receiving Chemotherapy  Today you received the following chemotherapy agents IRINOTECAN, VECTIBIX  To help prevent nausea and vomiting after your treatment, we encourage you to take your nausea medication TONIGHT ABOUT 6 PM AND THEN AS NEEDED.   If you develop nausea and vomiting that is not controlled by your nausea medication, call the clinic.   BELOW ARE SYMPTOMS THAT SHOULD BE REPORTED IMMEDIATELY:  *FEVER GREATER THAN 100.5 F  *CHILLS WITH OR WITHOUT FEVER  NAUSEA AND VOMITING THAT IS NOT CONTROLLED WITH YOUR NAUSEA MEDICATION  *UNUSUAL SHORTNESS OF BREATH  *UNUSUAL BRUISING OR BLEEDING  TENDERNESS IN MOUTH AND THROAT WITH OR WITHOUT PRESENCE OF ULCERS  *URINARY PROBLEMS  *BOWEL PROBLEMS  UNUSUAL RASH Items with * indicate a potential emergency and should be followed up as soon as possible.  Feel free to call the clinic you have any questions or concerns. The clinic phone number is [phone].

## 2013-10-21 NOTE — Telephone Encounter (Signed)
Per Dr. Benay Spice: OK to treat today with labs from 10/14/13. Last chemo was 5/19. Pt aware and treatment room charge nurse informed. Lab appt canceled.

## 2013-10-22 ENCOUNTER — Telehealth: Payer: Self-pay | Admitting: *Deleted

## 2013-10-22 NOTE — Telephone Encounter (Signed)
No adverse effect from chemo. Has no questions at this time.

## 2013-11-02 ENCOUNTER — Other Ambulatory Visit: Payer: Self-pay | Admitting: Oncology

## 2013-11-04 ENCOUNTER — Ambulatory Visit (HOSPITAL_BASED_OUTPATIENT_CLINIC_OR_DEPARTMENT_OTHER): Payer: Federal, State, Local not specified - PPO | Admitting: Nurse Practitioner

## 2013-11-04 ENCOUNTER — Telehealth: Payer: Self-pay | Admitting: Oncology

## 2013-11-04 ENCOUNTER — Other Ambulatory Visit (HOSPITAL_BASED_OUTPATIENT_CLINIC_OR_DEPARTMENT_OTHER): Payer: Federal, State, Local not specified - PPO

## 2013-11-04 ENCOUNTER — Ambulatory Visit (HOSPITAL_BASED_OUTPATIENT_CLINIC_OR_DEPARTMENT_OTHER): Payer: Federal, State, Local not specified - PPO

## 2013-11-04 VITALS — BP 130/76 | HR 73 | Temp 97.9°F | Resp 19 | Ht 72.0 in | Wt 199.5 lb

## 2013-11-04 DIAGNOSIS — Z5112 Encounter for antineoplastic immunotherapy: Secondary | ICD-10-CM

## 2013-11-04 DIAGNOSIS — Z5111 Encounter for antineoplastic chemotherapy: Secondary | ICD-10-CM

## 2013-11-04 DIAGNOSIS — C189 Malignant neoplasm of colon, unspecified: Secondary | ICD-10-CM

## 2013-11-04 DIAGNOSIS — R21 Rash and other nonspecific skin eruption: Secondary | ICD-10-CM

## 2013-11-04 DIAGNOSIS — C19 Malignant neoplasm of rectosigmoid junction: Secondary | ICD-10-CM

## 2013-11-04 DIAGNOSIS — C787 Secondary malignant neoplasm of liver and intrahepatic bile duct: Secondary | ICD-10-CM

## 2013-11-04 DIAGNOSIS — D709 Neutropenia, unspecified: Secondary | ICD-10-CM

## 2013-11-04 DIAGNOSIS — L270 Generalized skin eruption due to drugs and medicaments taken internally: Secondary | ICD-10-CM

## 2013-11-04 LAB — COMPREHENSIVE METABOLIC PANEL (CC13)
ALK PHOS: 140 U/L (ref 40–150)
ALT: 39 U/L (ref 0–55)
AST: 44 U/L — AB (ref 5–34)
Albumin: 3.7 g/dL (ref 3.5–5.0)
Anion Gap: 6 mEq/L (ref 3–11)
BILIRUBIN TOTAL: 1.5 mg/dL — AB (ref 0.20–1.20)
BUN: 14.4 mg/dL (ref 7.0–26.0)
CALCIUM: 9.5 mg/dL (ref 8.4–10.4)
CO2: 27 mEq/L (ref 22–29)
CREATININE: 0.8 mg/dL (ref 0.7–1.3)
Chloride: 107 mEq/L (ref 98–109)
Glucose: 99 mg/dl (ref 70–140)
Potassium: 4.1 mEq/L (ref 3.5–5.1)
Sodium: 140 mEq/L (ref 136–145)
Total Protein: 7 g/dL (ref 6.4–8.3)

## 2013-11-04 LAB — CBC WITH DIFFERENTIAL/PLATELET
BASO%: 1.4 % (ref 0.0–2.0)
Basophils Absolute: 0.1 10*3/uL (ref 0.0–0.1)
EOS%: 10.4 % — AB (ref 0.0–7.0)
Eosinophils Absolute: 0.5 10*3/uL (ref 0.0–0.5)
HCT: 42.2 % (ref 38.4–49.9)
HEMOGLOBIN: 14 g/dL (ref 13.0–17.1)
LYMPH%: 36.5 % (ref 14.0–49.0)
MCH: 32.5 pg (ref 27.2–33.4)
MCHC: 33.1 g/dL (ref 32.0–36.0)
MCV: 98.3 fL — AB (ref 79.3–98.0)
MONO#: 0.5 10*3/uL (ref 0.1–0.9)
MONO%: 12.4 % (ref 0.0–14.0)
NEUT#: 1.7 10*3/uL (ref 1.5–6.5)
NEUT%: 39.3 % (ref 39.0–75.0)
Platelets: 245 10*3/uL (ref 140–400)
RBC: 4.29 10*6/uL (ref 4.20–5.82)
RDW: 16.5 % — ABNORMAL HIGH (ref 11.0–14.6)
WBC: 4.4 10*3/uL (ref 4.0–10.3)
lymph#: 1.6 10*3/uL (ref 0.9–3.3)

## 2013-11-04 LAB — MAGNESIUM (CC13): Magnesium: 2.2 mg/dl (ref 1.5–2.5)

## 2013-11-04 MED ORDER — ONDANSETRON 8 MG/50ML IVPB (CHCC)
8.0000 mg | Freq: Once | INTRAVENOUS | Status: AC
  Administered 2013-11-04: 8 mg via INTRAVENOUS

## 2013-11-04 MED ORDER — ONDANSETRON 8 MG/NS 50 ML IVPB
INTRAVENOUS | Status: AC
  Filled 2013-11-04: qty 8

## 2013-11-04 MED ORDER — IRINOTECAN HCL CHEMO INJECTION 100 MG/5ML
180.0000 mg/m2 | Freq: Once | INTRAVENOUS | Status: AC
  Administered 2013-11-04: 386 mg via INTRAVENOUS
  Filled 2013-11-04: qty 19.3

## 2013-11-04 MED ORDER — ATROPINE SULFATE 1 MG/ML IJ SOLN: 0.5000 mg | Freq: Once | INTRAMUSCULAR | Status: DC | PRN

## 2013-11-04 MED ORDER — HEPARIN SOD (PORK) LOCK FLUSH 100 UNIT/ML IV SOLN
500.0000 [IU] | Freq: Once | INTRAVENOUS | Status: AC | PRN
  Administered 2013-11-04: 500 [IU]
  Filled 2013-11-04: qty 5

## 2013-11-04 MED ORDER — SODIUM CHLORIDE 0.9 % IV SOLN
6.0000 mg/kg | Freq: Once | INTRAVENOUS | Status: AC
  Administered 2013-11-04: 540 mg via INTRAVENOUS
  Filled 2013-11-04: qty 27

## 2013-11-04 MED ORDER — SODIUM CHLORIDE 0.9 % IJ SOLN
10.0000 mL | INTRAMUSCULAR | Status: DC | PRN
  Administered 2013-11-04: 10 mL
  Filled 2013-11-04: qty 10

## 2013-11-04 MED ORDER — DEXAMETHASONE SODIUM PHOSPHATE 10 MG/ML IJ SOLN
INTRAMUSCULAR | Status: AC
  Filled 2013-11-04: qty 1

## 2013-11-04 MED ORDER — DEXAMETHASONE SODIUM PHOSPHATE 10 MG/ML IJ SOLN
10.0000 mg | Freq: Once | INTRAMUSCULAR | Status: AC
Start: 2013-11-04 — End: 2013-11-04
  Administered 2013-11-04: 10 mg via INTRAVENOUS

## 2013-11-04 MED ORDER — SODIUM CHLORIDE 0.9 % IV SOLN
Freq: Once | INTRAVENOUS | Status: AC
  Administered 2013-11-04: 12:00:00 via INTRAVENOUS

## 2013-11-04 NOTE — Patient Instructions (Signed)
[place] Discharge Instructions for Patients Receiving Chemotherapy  Today you received the following chemotherapy agents Vectibix/Irinotecan To help prevent nausea and vomiting after your treatment, we encourage you to take your nausea medication as prescribed. If you develop nausea and vomiting that is not controlled by your nausea medication, call the clinic.   BELOW ARE SYMPTOMS THAT SHOULD BE REPORTED IMMEDIATELY:  *FEVER GREATER THAN 100.5 F  *CHILLS WITH OR WITHOUT FEVER  NAUSEA AND VOMITING THAT IS NOT CONTROLLED WITH YOUR NAUSEA MEDICATION  *UNUSUAL SHORTNESS OF BREATH  *UNUSUAL BRUISING OR BLEEDING  TENDERNESS IN MOUTH AND THROAT WITH OR WITHOUT PRESENCE OF ULCERS  *URINARY PROBLEMS  *BOWEL PROBLEMS  UNUSUAL RASH Items with * indicate a potential emergency and should be followed up as soon as possible.  Feel free to call the clinic you have any questions or concerns. The clinic phone number is [phone].

## 2013-11-04 NOTE — Telephone Encounter (Signed)
gv adn printed appt sched and avs for pt for June and July....sed added tx. °

## 2013-11-04 NOTE — Progress Notes (Signed)
Lawrence Villa OFFICE PROGRESS NOTE   Diagnosis:  Colon cancer.  INTERVAL HISTORY:   Lawrence Villa returns as scheduled. Treatment was initiated with irinotecan/Panitumumab on 10/21/2013. He denies nausea/vomiting. He developed a few mouth sores. No diarrhea. He notes a rash over his face. He continues minocycline. He is not applying a moisturizer. He denies bleeding. He has a good appetite. He reports he is gaining weight. He denies pain.  Objective:  Vital signs in last 24 hours:  Blood pressure 130/76, pulse 73, temperature 97.9 F (36.6 C), temperature source Oral, resp. rate 19, height 6' (1.829 m), weight 199 lb 8 oz (90.493 kg), SpO2 100.00%.    HEENT: No thrush. Small ulceration lower inner lip. Mild angular chelitis. Resp: Lungs clear. Cardio: Regular cardiac rhythm. GI: Abdomen soft and nontender. No hepatomegaly. No mass. Vascular: No leg edema. Skin: Acne-like rash, pustular in some areas, scattered over the face.   Port-A-Cath site without erythema.   Lab Results:  Lab Results  Component Value Date   WBC 4.4 11/04/2013   HGB 14.0 11/04/2013   HCT 42.2 11/04/2013   MCV 98.3* 11/04/2013   PLT 245 11/04/2013   NEUTROABS 1.7 11/04/2013    Imaging:  No results found.  Medications: I have reviewed the patient's current medications.  Assessment/Plan: 1. Metastatic colon cancer status post low anterior resection on 12/22/2011 (T4, N2, M1). K-ras wild-type (extended K-ras testing with no K-ras, NRAS, or BRAF mutation identified). Initiation of CAPOX/Avastin on 01/23/2012. The CEA was in normal range on 05/28/2012. Restaging CT 04/12/2012 after 4 cycles of CAPOX/Avastin confirmed improvement in the hepatic metastases. He completed cycle 7 CAPOX/Avastin beginning 06/18/2012. He completed cycle 8 systemic therapy with Xeloda and Avastin (oxaliplatin discontinued) beginning 07/09/2012. He completed cycle 9 beginning 07/30/2012 and cycle 10 beginning on 08/20/2012.  Restaging CT evaluation 08/13/2012 with further improvement in liver metastases. He began another cycle of Xeloda on 09/10/2012. He declined further Avastin. He completed a cycle of dose reduced Xeloda beginning on 10/01/2012 and was then maintained off of systemic therapy. Systemic therapy resumed with Xeloda/Avastin 12/31/2012 (completed 3 days of Xeloda with cycle 1). The Xeloda was dose reduced with the cycle of chemotherapy beginning 04/01/2013 Restaging CT 04/14/2013, compared to 08/13/2012 revealed a mixed response in the liver  MRI of the abdomen 05/02/2013 confirmed a single calcified left hepatic lobe lesion and multiple peripherally enhancing right hepatic lobe lesions.  Diagnostic laparoscopy 06/10/2013 with findings of adhesions to midline; numerous lesions seen on the right liver with the largest on the dome; peritoneal nodules near right liver mass; small firm areas in left liver; small left liver. 2 biopsies were obtained from the left liver and a peritoneal nodule seen over the right sided liver lesion was also biopsied. Anterior liver core biopsy showed benign liver tissue with mild steatosis; no evidence of malignancy. Posterior liver wedge biopsy showed benign liver tissue; no evidence of malignancy. 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  CEA 06/17/2013 increased at 15.5, 24 on 07/22/2013  Cycle 1 of FOLFIRI/Avastin 07/22/2013.  Cycle 2 FOLFIRI/Avastin 08/05/2013.  Cycle 3 FOLFIRI/Avastin 08/20/2013.  CEA improved at 20.4 on 08/20/2013.  Cycle 4 FOLFIRI/Avastin 09/02/2013.  Cycle 5 FOLFIRI/Avastin 09/17/2013  Cycle 6 FOLFIRI/Avastin 09/30/2013  CT 10/10/2013 with progression of liver metastases. Initiation of irinotecan/Panitumumab on a 2 week schedule 10/21/2013. 2. Microcytic anemia-resolved. 3. History of weight loss secondary to metastatic colon cancer.  4. Status post Port-A-Cath placement 01/10/2012. 5. Hand-foot  syndrome secondary  to Xeloda. The Xeloda was dose reduced to 1000 mg twice daily beginning with cycle 4 CAPOX. The hand-foot symptoms progressed. The Xeloda was further dose reduced beginning with treatment on 04/01/2013. Most recent Xeloda dose 1000 mg twice daily. 6. Glaucoma-now on medical therapy. 7. Neutropenia-Neulasta was added with cycle 2 FOLFIRI. 8. History of Rectal bleeding with bowel movements-? Hemorrhoid bleeding,? Related to Avastin.  9. Acne-like rash related to Panitumumab. He will continue minocycline. He will begin a moisturizer.   Disposition: Lawrence Villa has completed one cycle of irinotecan/Panitumumab. Plan to proceed with cycle 2 today as scheduled.   The neutrophil count is mildly decreased. He will return for a Neulasta injection on 11/06/2013.   He will return for a followup visit and cycle 3 on 11/20/2013. He will contact the office in the interim with any problems.  Plan reviewed with Dr. Benay Spice.    [name] ANP/GNP-BC   11/04/2013  1:16 PM

## 2013-11-06 ENCOUNTER — Ambulatory Visit (HOSPITAL_BASED_OUTPATIENT_CLINIC_OR_DEPARTMENT_OTHER): Payer: Federal, State, Local not specified - PPO

## 2013-11-06 VITALS — BP 112/72 | Temp 87.0°F

## 2013-11-06 DIAGNOSIS — Z5189 Encounter for other specified aftercare: Secondary | ICD-10-CM

## 2013-11-06 DIAGNOSIS — C189 Malignant neoplasm of colon, unspecified: Secondary | ICD-10-CM

## 2013-11-06 DIAGNOSIS — C787 Secondary malignant neoplasm of liver and intrahepatic bile duct: Secondary | ICD-10-CM

## 2013-11-06 MED ORDER — PEGFILGRASTIM INJECTION 6 MG/0.6ML
6.0000 mg | Freq: Once | SUBCUTANEOUS | Status: AC
  Administered 2013-11-06: 6 mg via SUBCUTANEOUS
  Filled 2013-11-06: qty 0.6

## 2013-11-20 ENCOUNTER — Telehealth: Payer: Self-pay | Admitting: *Deleted

## 2013-11-20 ENCOUNTER — Other Ambulatory Visit (HOSPITAL_BASED_OUTPATIENT_CLINIC_OR_DEPARTMENT_OTHER): Payer: Federal, State, Local not specified - PPO

## 2013-11-20 ENCOUNTER — Telehealth: Payer: Self-pay | Admitting: Oncology

## 2013-11-20 ENCOUNTER — Other Ambulatory Visit: Payer: Self-pay | Admitting: Oncology

## 2013-11-20 ENCOUNTER — Ambulatory Visit (HOSPITAL_BASED_OUTPATIENT_CLINIC_OR_DEPARTMENT_OTHER): Payer: Federal, State, Local not specified - PPO | Admitting: Nurse Practitioner

## 2013-11-20 ENCOUNTER — Ambulatory Visit (HOSPITAL_BASED_OUTPATIENT_CLINIC_OR_DEPARTMENT_OTHER): Payer: Federal, State, Local not specified - PPO

## 2013-11-20 VITALS — BP 127/90 | HR 92 | Temp 96.0°F | Resp 18 | Ht 72.0 in | Wt 202.1 lb

## 2013-11-20 VITALS — BP 124/77

## 2013-11-20 DIAGNOSIS — Z5111 Encounter for antineoplastic chemotherapy: Secondary | ICD-10-CM

## 2013-11-20 DIAGNOSIS — D709 Neutropenia, unspecified: Secondary | ICD-10-CM

## 2013-11-20 DIAGNOSIS — C787 Secondary malignant neoplasm of liver and intrahepatic bile duct: Secondary | ICD-10-CM

## 2013-11-20 DIAGNOSIS — C19 Malignant neoplasm of rectosigmoid junction: Secondary | ICD-10-CM

## 2013-11-20 DIAGNOSIS — C189 Malignant neoplasm of colon, unspecified: Secondary | ICD-10-CM

## 2013-11-20 DIAGNOSIS — R21 Rash and other nonspecific skin eruption: Secondary | ICD-10-CM

## 2013-11-20 LAB — CBC WITH DIFFERENTIAL/PLATELET
BASO%: 1.1 % (ref 0.0–2.0)
Basophils Absolute: 0.1 10*3/uL (ref 0.0–0.1)
EOS ABS: 0.3 10*3/uL (ref 0.0–0.5)
EOS%: 2.6 % (ref 0.0–7.0)
HEMATOCRIT: 45.4 % (ref 38.4–49.9)
HGB: 14.9 g/dL (ref 13.0–17.1)
LYMPH%: 19.8 % (ref 14.0–49.0)
MCH: 32.4 pg (ref 27.2–33.4)
MCHC: 32.9 g/dL (ref 32.0–36.0)
MCV: 98.7 fL — ABNORMAL HIGH (ref 79.3–98.0)
MONO#: 0.9 10*3/uL (ref 0.1–0.9)
MONO%: 8 % (ref 0.0–14.0)
NEUT#: 7.4 10*3/uL — ABNORMAL HIGH (ref 1.5–6.5)
NEUT%: 68.5 % (ref 39.0–75.0)
Platelets: 259 10*3/uL (ref 140–400)
RBC: 4.6 10*6/uL (ref 4.20–5.82)
RDW: 15.3 % — ABNORMAL HIGH (ref 11.0–14.6)
WBC: 10.9 10*3/uL — ABNORMAL HIGH (ref 4.0–10.3)
lymph#: 2.2 10*3/uL (ref 0.9–3.3)

## 2013-11-20 LAB — COMPREHENSIVE METABOLIC PANEL (CC13)
ALT: 54 U/L (ref 0–55)
ANION GAP: 9 meq/L (ref 3–11)
AST: 53 U/L — ABNORMAL HIGH (ref 5–34)
Albumin: 3.6 g/dL (ref 3.5–5.0)
Alkaline Phosphatase: 174 U/L — ABNORMAL HIGH (ref 40–150)
BUN: 13.9 mg/dL (ref 7.0–26.0)
CALCIUM: 9.4 mg/dL (ref 8.4–10.4)
CO2: 21 meq/L — AB (ref 22–29)
CREATININE: 0.8 mg/dL (ref 0.7–1.3)
Chloride: 110 mEq/L — ABNORMAL HIGH (ref 98–109)
GLUCOSE: 113 mg/dL (ref 70–140)
Potassium: 4.6 mEq/L (ref 3.5–5.1)
SODIUM: 140 meq/L (ref 136–145)
TOTAL PROTEIN: 7 g/dL (ref 6.4–8.3)
Total Bilirubin: 0.69 mg/dL (ref 0.20–1.20)

## 2013-11-20 LAB — CEA: CEA: 13.3 ng/mL — AB (ref 0.0–5.0)

## 2013-11-20 LAB — MAGNESIUM (CC13): Magnesium: 1.9 mg/dl (ref 1.5–2.5)

## 2013-11-20 MED ORDER — HEPARIN SOD (PORK) LOCK FLUSH 100 UNIT/ML IV SOLN
500.0000 [IU] | Freq: Once | INTRAVENOUS | Status: AC | PRN
  Administered 2013-11-20: 500 [IU]
  Filled 2013-11-20: qty 5

## 2013-11-20 MED ORDER — ONDANSETRON 8 MG/NS 50 ML IVPB
INTRAVENOUS | Status: AC
  Filled 2013-11-20: qty 8

## 2013-11-20 MED ORDER — DEXAMETHASONE SODIUM PHOSPHATE 10 MG/ML IJ SOLN
INTRAMUSCULAR | Status: AC
  Filled 2013-11-20: qty 1

## 2013-11-20 MED ORDER — SODIUM CHLORIDE 0.9 % IV SOLN
6.0000 mg/kg | Freq: Once | INTRAVENOUS | Status: AC
  Administered 2013-11-20: 540 mg via INTRAVENOUS
  Filled 2013-11-20: qty 27

## 2013-11-20 MED ORDER — SODIUM CHLORIDE 0.9 % IJ SOLN
10.0000 mL | INTRAMUSCULAR | Status: DC | PRN
  Administered 2013-11-20: 10 mL
  Filled 2013-11-20: qty 10

## 2013-11-20 MED ORDER — SODIUM CHLORIDE 0.9 % IV SOLN
Freq: Once | INTRAVENOUS | Status: AC
  Administered 2013-11-20: 12:00:00 via INTRAVENOUS

## 2013-11-20 MED ORDER — DEXTROSE 5 % IV SOLN
180.0000 mg/m2 | Freq: Once | INTRAVENOUS | Status: AC
  Administered 2013-11-20: 386 mg via INTRAVENOUS
  Filled 2013-11-20: qty 19.3

## 2013-11-20 MED ORDER — DEXAMETHASONE SODIUM PHOSPHATE 10 MG/ML IJ SOLN
10.0000 mg | Freq: Once | INTRAMUSCULAR | Status: AC
  Administered 2013-11-20: 10 mg via INTRAVENOUS

## 2013-11-20 MED ORDER — ONDANSETRON 8 MG/50ML IVPB (CHCC)
8.0000 mg | Freq: Once | INTRAVENOUS | Status: AC
  Administered 2013-11-20: 8 mg via INTRAVENOUS

## 2013-11-20 NOTE — Telephone Encounter (Signed)
Per staff message and POF I have scheduled appts. Advised scheduler of appts. JMW  

## 2013-11-20 NOTE — Telephone Encounter (Signed)
Gave pt appt for lab chemo and MD for [facility] for chemo appt for August

## 2013-11-20 NOTE — Patient Instructions (Signed)
[facility] Discharge Instructions for Patients Receiving Chemotherapy  Today you received the following chemotherapy agents:  Vectibix and Camptosar  To help prevent nausea and vomiting after your treatment, we encourage you to take your nausea medication as ordered per MD.   If you develop nausea and vomiting that is not controlled by your nausea medication, call the clinic.   BELOW ARE SYMPTOMS THAT SHOULD BE REPORTED IMMEDIATELY:  *FEVER GREATER THAN 100.5 F  *CHILLS WITH OR WITHOUT FEVER  NAUSEA AND VOMITING THAT IS NOT CONTROLLED WITH YOUR NAUSEA MEDICATION  *UNUSUAL SHORTNESS OF BREATH  *UNUSUAL BRUISING OR BLEEDING  TENDERNESS IN MOUTH AND THROAT WITH OR WITHOUT PRESENCE OF ULCERS  *URINARY PROBLEMS  *BOWEL PROBLEMS  UNUSUAL RASH Items with * indicate a potential emergency and should be followed up as soon as possible.  Feel free to call the clinic you have any questions or concerns. The clinic phone number is [phone].

## 2013-11-20 NOTE — Progress Notes (Signed)
Lawrence Villa returns as scheduled. He completed cycle 2 irinotecan/Panitumumab on 11/04/2013. He notes increased rash over the face and upper chest. He continues minocycline. He is applying a moisturizing lotion. No nausea/vomiting. No mouth sores. No diarrhea. He continues to have a good appetite. He is gaining weight. He intermittently notes "a little discomfort" at the right upper quadrant.  Objective:  Vital signs in last 24 hours:  Blood pressure 127/90, pulse 92, temperature 96 F (35.6 C), temperature source Oral, resp. rate 18, height 6' (1.829 m), weight 202 lb 1.6 oz (91.672 kg).    HEENT: No thrush or ulcerations. Mild angular chelitis. Resp: Lungs clear. Cardio: Regular cardiac rhythm. GI: Abdomen soft and nontender. No hepatomegaly. Vascular: No leg edema. Calves soft and nontender. Skin: Acne-like rash scattered over the face, anterior chest and upper back.   Port-A-Cath site without erythema.  Lab Results:  Lab Results  Component Value Date   WBC 10.9* 11/20/2013   HGB 14.9 11/20/2013   HCT 45.4 11/20/2013   MCV 98.7* 11/20/2013   PLT 259 11/20/2013   NEUTROABS 7.4* 11/20/2013    Imaging:  No results found.  Medications: I have reviewed the patient's current medications.  Assessment/Plan: 1. Metastatic colon cancer status post low anterior resection on 12/22/2011 (T4, N2, M1). K-ras wild-type (extended K-ras testing with no K-ras, NRAS, or BRAF mutation identified). Initiation of CAPOX/Avastin on 01/23/2012. The CEA was in normal range on 05/28/2012. Restaging CT 04/12/2012 after 4 cycles of CAPOX/Avastin confirmed improvement in the hepatic metastases. He completed cycle 7 CAPOX/Avastin beginning 06/18/2012. He completed cycle 8 systemic therapy with Xeloda and Avastin (oxaliplatin discontinued) beginning 07/09/2012. He completed cycle 9 beginning 07/30/2012 and cycle 10  beginning on 08/20/2012. Restaging CT evaluation 08/13/2012 with further improvement in liver metastases. He began another cycle of Xeloda on 09/10/2012. He declined further Avastin. He completed a cycle of dose reduced Xeloda beginning on 10/01/2012 and was then maintained off of systemic therapy. Systemic therapy resumed with Xeloda/Avastin 12/31/2012 (completed 3 days of Xeloda with cycle 1). The Xeloda was dose reduced with the cycle of chemotherapy beginning 04/01/2013 Restaging CT 04/14/2013, compared to 08/13/2012 revealed a mixed response in the liver  MRI of the abdomen 05/02/2013 confirmed a single calcified left hepatic lobe lesion and multiple peripherally enhancing right hepatic lobe lesions.  Diagnostic laparoscopy 06/10/2013 with findings of adhesions to midline; numerous lesions seen on the right liver with the largest on the dome; peritoneal nodules near right liver mass; small firm areas in left liver; small left liver. 2 biopsies were obtained from the left liver and a peritoneal nodule seen over the right sided liver lesion was also biopsied. Anterior liver core biopsy showed benign liver tissue with mild steatosis; no evidence of malignancy. Posterior liver wedge biopsy showed benign liver tissue; no evidence of malignancy. 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  CEA 06/17/2013 increased at 15.5, 24 on 07/22/2013  Cycle 1 of FOLFIRI/Avastin 07/22/2013.  Cycle 2 FOLFIRI/Avastin 08/05/2013.  Cycle 3 FOLFIRI/Avastin 08/20/2013.  CEA improved at 20.4 on 08/20/2013.  Cycle 4 FOLFIRI/Avastin 09/02/2013.  Cycle 5 FOLFIRI/Avastin 09/17/2013  Cycle 6 FOLFIRI/Avastin 09/30/2013  CT 10/10/2013 with progression of liver metastases.  Initiation of irinotecan/Panitumumab on a 2 week schedule 10/21/2013. 2. Microcytic anemia-resolved. 3. History of weight loss secondary to metastatic colon cancer.  4. Status post Port-A-Cath placement  01/10/2012. 5. Hand-foot  syndrome secondary to Xeloda. The Xeloda was dose reduced to 1000 mg twice daily beginning with cycle 4 CAPOX. The hand-foot symptoms progressed. The Xeloda was further dose reduced beginning with treatment on 04/01/2013. Most recent Xeloda dose 1000 mg twice daily. 6. Glaucoma-now on medical therapy. 7. Neutropenia-Neulasta was added with cycle 2 FOLFIRI. 8. History of Rectal bleeding with bowel movements-? Hemorrhoid bleeding,? Related to Avastin.  9. Acne-like rash related to Panitumumab. He will continue minocycline.   Disposition: Lawrence Villa appears stable. He has completed 2 cycles of irinotecan/Panitumumab. Plan to proceed with cycle 3 today as scheduled. We will hold Neulasta with this cycle due to the mildly elevated white count.  He will return for a followup visit and cycle 4 in 2 weeks. He will contact the office in the interim with any problems.  Plan reviewed with Dr. Benay Spice.    Lawrence Villa ANP/GNP-BC   11/20/2013  10:52 AM

## 2013-11-21 ENCOUNTER — Telehealth: Payer: Self-pay

## 2013-11-21 NOTE — Telephone Encounter (Signed)
Message copied by Tyler Aas A on Fri Nov 21, 2013 11:03 AM ------      Message from: [place], Jasper K      Created: Thu Nov 20, 2013  4:51 PM       Please let him know CEA is stable.      ----- Message -----         From: [facility]         Sent: 11/20/2013  10:28 AM           To: Owens Shark, NP                   ------

## 2013-11-21 NOTE — Telephone Encounter (Signed)
Called and informed patient CEA was stable at 13.3. Patient denies any questions or concerns at this time.

## 2013-11-24 ENCOUNTER — Telehealth: Payer: Self-pay | Admitting: *Deleted

## 2013-11-24 NOTE — Telephone Encounter (Signed)
Received phone call from patient stating that he would like to change his appointments on 7/21 to 7/20.  Returned patient's phone call and informed him that there is no openings with Lattie Haw K. Thomas or Dr. Benay Spice on 7/20.  Patient verbalized understanding.

## 2013-12-02 ENCOUNTER — Telehealth: Payer: Self-pay | Admitting: Oncology

## 2013-12-02 ENCOUNTER — Other Ambulatory Visit (HOSPITAL_BASED_OUTPATIENT_CLINIC_OR_DEPARTMENT_OTHER): Payer: Federal, State, Local not specified - PPO

## 2013-12-02 ENCOUNTER — Ambulatory Visit (HOSPITAL_BASED_OUTPATIENT_CLINIC_OR_DEPARTMENT_OTHER): Payer: Federal, State, Local not specified - PPO | Admitting: Nurse Practitioner

## 2013-12-02 ENCOUNTER — Other Ambulatory Visit: Payer: Self-pay | Admitting: Oncology

## 2013-12-02 ENCOUNTER — Ambulatory Visit (HOSPITAL_BASED_OUTPATIENT_CLINIC_OR_DEPARTMENT_OTHER): Payer: Federal, State, Local not specified - PPO

## 2013-12-02 ENCOUNTER — Other Ambulatory Visit: Payer: Self-pay | Admitting: Nurse Practitioner

## 2013-12-02 VITALS — BP 128/84 | HR 79 | Temp 97.5°F | Resp 18 | Ht 72.0 in | Wt 198.0 lb

## 2013-12-02 DIAGNOSIS — C189 Malignant neoplasm of colon, unspecified: Secondary | ICD-10-CM

## 2013-12-02 DIAGNOSIS — C787 Secondary malignant neoplasm of liver and intrahepatic bile duct: Secondary | ICD-10-CM

## 2013-12-02 DIAGNOSIS — R21 Rash and other nonspecific skin eruption: Secondary | ICD-10-CM

## 2013-12-02 DIAGNOSIS — D709 Neutropenia, unspecified: Secondary | ICD-10-CM

## 2013-12-02 DIAGNOSIS — C19 Malignant neoplasm of rectosigmoid junction: Secondary | ICD-10-CM

## 2013-12-02 DIAGNOSIS — Z5111 Encounter for antineoplastic chemotherapy: Secondary | ICD-10-CM

## 2013-12-02 DIAGNOSIS — Z5112 Encounter for antineoplastic immunotherapy: Secondary | ICD-10-CM

## 2013-12-02 LAB — COMPREHENSIVE METABOLIC PANEL (CC13)
ALBUMIN: 3.4 g/dL — AB (ref 3.5–5.0)
ALK PHOS: 136 U/L (ref 40–150)
ALT: 31 U/L (ref 0–55)
AST: 40 U/L — AB (ref 5–34)
Anion Gap: 7 mEq/L (ref 3–11)
BUN: 12.1 mg/dL (ref 7.0–26.0)
CALCIUM: 9.2 mg/dL (ref 8.4–10.4)
CO2: 25 mEq/L (ref 22–29)
CREATININE: 0.8 mg/dL (ref 0.7–1.3)
Chloride: 107 mEq/L (ref 98–109)
GLUCOSE: 81 mg/dL (ref 70–140)
Potassium: 4.1 mEq/L (ref 3.5–5.1)
Sodium: 140 mEq/L (ref 136–145)
Total Bilirubin: 0.91 mg/dL (ref 0.20–1.20)
Total Protein: 6.7 g/dL (ref 6.4–8.3)

## 2013-12-02 LAB — CBC WITH DIFFERENTIAL/PLATELET
BASO%: 1.1 % (ref 0.0–2.0)
BASOS ABS: 0 10*3/uL (ref 0.0–0.1)
EOS%: 10.1 % — ABNORMAL HIGH (ref 0.0–7.0)
Eosinophils Absolute: 0.4 10*3/uL (ref 0.0–0.5)
HCT: 39.6 % (ref 38.4–49.9)
HEMOGLOBIN: 13 g/dL (ref 13.0–17.1)
LYMPH%: 35.7 % (ref 14.0–49.0)
MCH: 31.8 pg (ref 27.2–33.4)
MCHC: 32.9 g/dL (ref 32.0–36.0)
MCV: 96.7 fL (ref 79.3–98.0)
MONO#: 0.5 10*3/uL (ref 0.1–0.9)
MONO%: 11.3 % (ref 0.0–14.0)
NEUT#: 1.8 10*3/uL (ref 1.5–6.5)
NEUT%: 41.8 % (ref 39.0–75.0)
Platelets: 302 10*3/uL (ref 140–400)
RBC: 4.1 10*6/uL — ABNORMAL LOW (ref 4.20–5.82)
RDW: 15.3 % — AB (ref 11.0–14.6)
WBC: 4.4 10*3/uL (ref 4.0–10.3)
lymph#: 1.6 10*3/uL (ref 0.9–3.3)

## 2013-12-02 MED ORDER — PANITUMUMAB CHEMO INJECTION 100 MG/5ML
6.0000 mg/kg | Freq: Once | INTRAVENOUS | Status: AC
  Administered 2013-12-02: 540 mg via INTRAVENOUS
  Filled 2013-12-02: qty 27

## 2013-12-02 MED ORDER — ONDANSETRON 8 MG/NS 50 ML IVPB
INTRAVENOUS | Status: AC
  Filled 2013-12-02: qty 8

## 2013-12-02 MED ORDER — SODIUM CHLORIDE 0.9 % IV SOLN
Freq: Once | INTRAVENOUS | Status: AC
  Administered 2013-12-02: 12:00:00 via INTRAVENOUS

## 2013-12-02 MED ORDER — DEXAMETHASONE SODIUM PHOSPHATE 10 MG/ML IJ SOLN
INTRAMUSCULAR | Status: AC
  Filled 2013-12-02: qty 1

## 2013-12-02 MED ORDER — ONDANSETRON 8 MG/50ML IVPB (CHCC)
8.0000 mg | Freq: Once | INTRAVENOUS | Status: AC
  Administered 2013-12-02: 8 mg via INTRAVENOUS

## 2013-12-02 MED ORDER — SODIUM CHLORIDE 0.9 % IJ SOLN
10.0000 mL | INTRAMUSCULAR | Status: DC | PRN
  Administered 2013-12-02: 10 mL
  Filled 2013-12-02: qty 10

## 2013-12-02 MED ORDER — IRINOTECAN HCL CHEMO INJECTION 100 MG/5ML
180.0000 mg/m2 | Freq: Once | INTRAVENOUS | Status: AC
  Administered 2013-12-02: 386 mg via INTRAVENOUS
  Filled 2013-12-02: qty 19.3

## 2013-12-02 MED ORDER — DEXAMETHASONE SODIUM PHOSPHATE 10 MG/ML IJ SOLN
10.0000 mg | Freq: Once | INTRAMUSCULAR | Status: AC
  Administered 2013-12-02: 10 mg via INTRAVENOUS

## 2013-12-02 MED ORDER — HEPARIN SOD (PORK) LOCK FLUSH 100 UNIT/ML IV SOLN
500.0000 [IU] | Freq: Once | INTRAVENOUS | Status: AC | PRN
  Administered 2013-12-02: 500 [IU]
  Filled 2013-12-02: qty 5

## 2013-12-02 NOTE — Progress Notes (Signed)
[place] OFFICE PROGRESS NOTE   Diagnosis:  Colon cancer.  INTERVAL HISTORY:   Lawrence Villa returns as scheduled. He completed cycle 3 irinotecan/Panitumumab on 11/20/2013. The rash over his face is stable. He notes an increased rash over the chest. He denies nausea/vomiting. No mouth sores. No significant diarrhea. He has intermittent loose stools and takes Imodium as needed. He continues to have a good appetite. He denies pain.  Objective:  Vital signs in last 24 hours:  Blood pressure 128/84, pulse 79, temperature 97.5 F (36.4 C), temperature source Oral, resp. rate 18, height 6' (1.829 m), weight 198 lb (89.812 kg), SpO2 100.00%.    HEENT: Small ulceration at the right lateral tongue. Resp: Lungs clear. Cardio: Regular cardiac rhythm. GI: Abdomen soft and nontender. No hepatomegaly. Vascular: No leg edema.  Skin: Acne-like rash over the face and anterior chest. No significant rash on the back.  Port-A-Cath site without erythema.    Lab Results:  Lab Results  Component Value Date   WBC 4.4 12/02/2013   HGB 13.0 12/02/2013   HCT 39.6 12/02/2013   MCV 96.7 12/02/2013   PLT 302 12/02/2013   NEUTROABS 1.8 12/02/2013    Imaging:  No results found.  Medications: I have reviewed the patient's current medications.  Assessment/Plan: 1. Metastatic colon cancer status post low anterior resection on 12/22/2011 (T4, N2, M1). K-ras wild-type (extended K-ras testing with no K-ras, NRAS, or BRAF mutation identified). Initiation of CAPOX/Avastin on 01/23/2012. The CEA was in normal range on 05/28/2012. Restaging CT 04/12/2012 after 4 cycles of CAPOX/Avastin confirmed improvement in the hepatic metastases. He completed cycle 7 CAPOX/Avastin beginning 06/18/2012. He completed cycle 8 systemic therapy with Xeloda and Avastin (oxaliplatin discontinued) beginning 07/09/2012. He completed cycle 9 beginning 07/30/2012 and cycle 10 beginning on 08/20/2012. Restaging CT evaluation  08/13/2012 with further improvement in liver metastases. He began another cycle of Xeloda on 09/10/2012. He declined further Avastin. He completed a cycle of dose reduced Xeloda beginning on 10/01/2012 and was then maintained off of systemic therapy. Systemic therapy resumed with Xeloda/Avastin 12/31/2012 (completed 3 days of Xeloda with cycle 1). The Xeloda was dose reduced with the cycle of chemotherapy beginning 04/01/2013 Restaging CT 04/14/2013, compared to 08/13/2012 revealed a mixed response in the liver  MRI of the abdomen 05/02/2013 confirmed a single calcified left hepatic lobe lesion and multiple peripherally enhancing right hepatic lobe lesions.  Diagnostic laparoscopy 06/10/2013 with findings of adhesions to midline; numerous lesions seen on the right liver with the largest on the dome; peritoneal nodules near right liver mass; small firm areas in left liver; small left liver. 2 biopsies were obtained from the left liver and a peritoneal nodule seen over the right sided liver lesion was also biopsied. Anterior liver core biopsy showed benign liver tissue with mild steatosis; no evidence of malignancy. Posterior liver wedge biopsy showed benign liver tissue; no evidence of malignancy. 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  CEA 06/17/2013 increased at 15.5, 24 on 07/22/2013  Cycle 1 of FOLFIRI/Avastin 07/22/2013.  Cycle 2 FOLFIRI/Avastin 08/05/2013.  Cycle 3 FOLFIRI/Avastin 08/20/2013.  CEA improved at 20.4 on 08/20/2013.  Cycle 4 FOLFIRI/Avastin 09/02/2013.  Cycle 5 FOLFIRI/Avastin 09/17/2013  Cycle 6 FOLFIRI/Avastin 09/30/2013  CT 10/10/2013 with progression of liver metastases.  Initiation of irinotecan/Panitumumab on a 2 week schedule 10/21/2013. CEA stable at 13.3 on 11/20/2013. Cycle 3 irinotecan/Panitumumab 11/20/2013. 2. Microcytic anemia-resolved. 3. History of weight loss secondary to metastatic colon cancer.  4. Status  post Port-A-Cath placement 01/10/2012. 5. Hand-foot syndrome secondary to Xeloda. The Xeloda was dose reduced to 1000 mg twice daily beginning with cycle 4 CAPOX. The hand-foot symptoms progressed. The Xeloda was further dose reduced beginning with treatment on 04/01/2013. Most recent Xeloda dose 1000 mg twice daily. 6. Glaucoma-now on medical therapy. 7. Neutropenia-Neulasta was added with cycle 2 FOLFIRI. 8. History of Rectal bleeding with bowel movements-? Hemorrhoid bleeding,? Related to Avastin.  9. Acne-like rash related to Panitumumab. He will continue minocycline. Progressive rash over the chest 12/02/2013.   Disposition: Lawrence Villa appears stable. He has completed 3 cycles of irinotecan/Panitumumab. Plan to proceed with cycle 4 today as scheduled. He will receive a Neulasta injection 12/03/2013.  The rash over his chest has progressed. He will continue minocycline. He will apply a moisturizing lotion. He can use over-the-counter hydrocortisone cream if needed.  He will return for a followup visit and cycle 5 irinotecan/Panitumumab in 2 weeks. Plan is for a restaging CT evaluation after 6 cycles.  Plan reviewed with Dr. Benay Spice.    [name] ANP/GNP-BC   12/02/2013  10:50 AM

## 2013-12-02 NOTE — Telephone Encounter (Signed)
gv adn rpinted aptps ched and avs for pt for July adn Aug....sed added tx.Marland KitchenMarland KitchenMarland Kitchen

## 2013-12-02 NOTE — Patient Instructions (Addendum)
[facility] Discharge Instructions for Patients Receiving Chemotherapy  Today you received the following chemotherapy agents vectibex and camptosar.  To help prevent nausea and vomiting after your treatment, we encourage you to take your nausea medication as prescribed.compazine 10 mg every 6-8 hours as needed.   If you develop nausea and vomiting that is not controlled by your nausea medication, call the clinic.   BELOW ARE SYMPTOMS THAT SHOULD BE REPORTED IMMEDIATELY:  *FEVER GREATER THAN 100.5 F  *CHILLS WITH OR WITHOUT FEVER  NAUSEA AND VOMITING THAT IS NOT CONTROLLED WITH YOUR NAUSEA MEDICATION  *UNUSUAL SHORTNESS OF BREATH  *UNUSUAL BRUISING OR BLEEDING  TENDERNESS IN MOUTH AND THROAT WITH OR WITHOUT PRESENCE OF ULCERS  *URINARY PROBLEMS  *BOWEL PROBLEMS  UNUSUAL RASH Items with * indicate a potential emergency and should be followed up as soon as possible.  Feel free to call the clinic you have any questions or concerns. The clinic phone number is [phone].

## 2013-12-03 ENCOUNTER — Ambulatory Visit (HOSPITAL_BASED_OUTPATIENT_CLINIC_OR_DEPARTMENT_OTHER): Payer: Federal, State, Local not specified - PPO

## 2013-12-03 ENCOUNTER — Other Ambulatory Visit: Payer: Self-pay | Admitting: Nurse Practitioner

## 2013-12-03 VITALS — BP 121/72 | HR 89 | Temp 97.8°F

## 2013-12-03 DIAGNOSIS — Z5189 Encounter for other specified aftercare: Secondary | ICD-10-CM

## 2013-12-03 DIAGNOSIS — C189 Malignant neoplasm of colon, unspecified: Secondary | ICD-10-CM

## 2013-12-03 DIAGNOSIS — D709 Neutropenia, unspecified: Secondary | ICD-10-CM

## 2013-12-03 DIAGNOSIS — C19 Malignant neoplasm of rectosigmoid junction: Secondary | ICD-10-CM

## 2013-12-03 DIAGNOSIS — C787 Secondary malignant neoplasm of liver and intrahepatic bile duct: Secondary | ICD-10-CM

## 2013-12-03 MED ORDER — PEGFILGRASTIM INJECTION 6 MG/0.6ML
6.0000 mg | Freq: Once | SUBCUTANEOUS | Status: AC
  Administered 2013-12-03: 6 mg via SUBCUTANEOUS
  Filled 2013-12-03: qty 0.6

## 2013-12-14 ENCOUNTER — Other Ambulatory Visit: Payer: Self-pay | Admitting: Oncology

## 2013-12-16 ENCOUNTER — Ambulatory Visit (HOSPITAL_BASED_OUTPATIENT_CLINIC_OR_DEPARTMENT_OTHER): Payer: Federal, State, Local not specified - PPO

## 2013-12-16 ENCOUNTER — Other Ambulatory Visit (HOSPITAL_BASED_OUTPATIENT_CLINIC_OR_DEPARTMENT_OTHER): Payer: Federal, State, Local not specified - PPO

## 2013-12-16 ENCOUNTER — Telehealth: Payer: Self-pay | Admitting: Oncology

## 2013-12-16 ENCOUNTER — Ambulatory Visit (HOSPITAL_BASED_OUTPATIENT_CLINIC_OR_DEPARTMENT_OTHER): Payer: Federal, State, Local not specified - PPO | Admitting: Oncology

## 2013-12-16 VITALS — BP 116/72 | HR 79 | Temp 97.9°F | Resp 18 | Ht 72.0 in | Wt 195.6 lb

## 2013-12-16 DIAGNOSIS — C189 Malignant neoplasm of colon, unspecified: Secondary | ICD-10-CM

## 2013-12-16 DIAGNOSIS — C19 Malignant neoplasm of rectosigmoid junction: Secondary | ICD-10-CM

## 2013-12-16 DIAGNOSIS — D709 Neutropenia, unspecified: Secondary | ICD-10-CM

## 2013-12-16 DIAGNOSIS — C787 Secondary malignant neoplasm of liver and intrahepatic bile duct: Secondary | ICD-10-CM

## 2013-12-16 DIAGNOSIS — R21 Rash and other nonspecific skin eruption: Secondary | ICD-10-CM

## 2013-12-16 DIAGNOSIS — Z5111 Encounter for antineoplastic chemotherapy: Secondary | ICD-10-CM

## 2013-12-16 DIAGNOSIS — H409 Unspecified glaucoma: Secondary | ICD-10-CM

## 2013-12-16 LAB — CBC WITH DIFFERENTIAL/PLATELET
BASO%: 0.5 % (ref 0.0–2.0)
BASOS ABS: 0.1 10*3/uL (ref 0.0–0.1)
EOS ABS: 0.2 10*3/uL (ref 0.0–0.5)
EOS%: 2.1 % (ref 0.0–7.0)
HCT: 42 % (ref 38.4–49.9)
HEMOGLOBIN: 13.6 g/dL (ref 13.0–17.1)
LYMPH%: 18.8 % (ref 14.0–49.0)
MCH: 31.1 pg (ref 27.2–33.4)
MCHC: 32.5 g/dL (ref 32.0–36.0)
MCV: 95.9 fL (ref 79.3–98.0)
MONO#: 0.9 10*3/uL (ref 0.1–0.9)
MONO%: 7.5 % (ref 0.0–14.0)
NEUT%: 71.1 % (ref 39.0–75.0)
NEUTROS ABS: 8.1 10*3/uL — AB (ref 1.5–6.5)
Platelets: 245 10*3/uL (ref 140–400)
RBC: 4.38 10*6/uL (ref 4.20–5.82)
RDW: 15.1 % — AB (ref 11.0–14.6)
WBC: 11.4 10*3/uL — ABNORMAL HIGH (ref 4.0–10.3)
lymph#: 2.1 10*3/uL (ref 0.9–3.3)

## 2013-12-16 LAB — COMPREHENSIVE METABOLIC PANEL (CC13)
ALBUMIN: 3.5 g/dL (ref 3.5–5.0)
ALT: 36 U/L (ref 0–55)
ANION GAP: 9 meq/L (ref 3–11)
AST: 51 U/L — ABNORMAL HIGH (ref 5–34)
Alkaline Phosphatase: 194 U/L — ABNORMAL HIGH (ref 40–150)
BUN: 11.4 mg/dL (ref 7.0–26.0)
CALCIUM: 9.3 mg/dL (ref 8.4–10.4)
CO2: 26 mEq/L (ref 22–29)
Chloride: 107 mEq/L (ref 98–109)
Creatinine: 0.7 mg/dL (ref 0.7–1.3)
GLUCOSE: 104 mg/dL (ref 70–140)
Potassium: 4 mEq/L (ref 3.5–5.1)
Sodium: 142 mEq/L (ref 136–145)
Total Bilirubin: 0.6 mg/dL (ref 0.20–1.20)
Total Protein: 6.9 g/dL (ref 6.4–8.3)

## 2013-12-16 LAB — MAGNESIUM (CC13): Magnesium: 1.8 mg/dl (ref 1.5–2.5)

## 2013-12-16 MED ORDER — DEXAMETHASONE SODIUM PHOSPHATE 10 MG/ML IJ SOLN
10.0000 mg | Freq: Once | INTRAMUSCULAR | Status: AC
  Administered 2013-12-16: 10 mg via INTRAVENOUS

## 2013-12-16 MED ORDER — IRINOTECAN HCL CHEMO INJECTION 100 MG/5ML
180.0000 mg/m2 | Freq: Once | INTRAVENOUS | Status: AC
  Administered 2013-12-16: 386 mg via INTRAVENOUS
  Filled 2013-12-16: qty 19.3

## 2013-12-16 MED ORDER — SODIUM CHLORIDE 0.9 % IV SOLN
Freq: Once | INTRAVENOUS | Status: AC
  Administered 2013-12-16: 12:00:00 via INTRAVENOUS

## 2013-12-16 MED ORDER — PANITUMUMAB CHEMO INJECTION 100 MG/5ML
6.0000 mg/kg | Freq: Once | INTRAVENOUS | Status: AC
  Administered 2013-12-16: 540 mg via INTRAVENOUS
  Filled 2013-12-16: qty 27

## 2013-12-16 MED ORDER — HEPARIN SOD (PORK) LOCK FLUSH 100 UNIT/ML IV SOLN
500.0000 [IU] | Freq: Once | INTRAVENOUS | Status: AC | PRN
  Administered 2013-12-16: 500 [IU]
  Filled 2013-12-16: qty 5

## 2013-12-16 MED ORDER — DEXAMETHASONE SODIUM PHOSPHATE 10 MG/ML IJ SOLN
INTRAMUSCULAR | Status: AC
  Filled 2013-12-16: qty 1

## 2013-12-16 MED ORDER — ONDANSETRON 8 MG/NS 50 ML IVPB
INTRAVENOUS | Status: AC
  Filled 2013-12-16: qty 8

## 2013-12-16 MED ORDER — SODIUM CHLORIDE 0.9 % IJ SOLN
10.0000 mL | INTRAMUSCULAR | Status: DC | PRN
  Administered 2013-12-16: 10 mL
  Filled 2013-12-16: qty 10

## 2013-12-16 MED ORDER — ONDANSETRON 8 MG/50ML IVPB (CHCC)
8.0000 mg | Freq: Once | INTRAVENOUS | Status: AC
  Administered 2013-12-16: 8 mg via INTRAVENOUS

## 2013-12-16 NOTE — Patient Instructions (Signed)
[facility] Discharge Instructions for Patients Receiving Chemotherapy  Today you received the following chemotherapy agents Vectibix/Irinotecan.  To help prevent nausea and vomiting after your treatment, we encourage you to take your nausea medication as prescribed.   If you develop nausea and vomiting that is not controlled by your nausea medication, call the clinic.   BELOW ARE SYMPTOMS THAT SHOULD BE REPORTED IMMEDIATELY:  *FEVER GREATER THAN 100.5 F  *CHILLS WITH OR WITHOUT FEVER  NAUSEA AND VOMITING THAT IS NOT CONTROLLED WITH YOUR NAUSEA MEDICATION  *UNUSUAL SHORTNESS OF BREATH  *UNUSUAL BRUISING OR BLEEDING  TENDERNESS IN MOUTH AND THROAT WITH OR WITHOUT PRESENCE OF ULCERS  *URINARY PROBLEMS  *BOWEL PROBLEMS  UNUSUAL RASH Items with * indicate a potential emergency and should be followed up as soon as possible.  Feel free to call the clinic you have any questions or concerns. The clinic phone number is [phone].

## 2013-12-16 NOTE — Addendum Note (Signed)
Addended by: Betsy Coder B on: 12/16/2013 06:32 PM   Modules accepted: Level of Service

## 2013-12-16 NOTE — Telephone Encounter (Signed)
gv pt appt schedule for aug/sept °

## 2013-12-16 NOTE — Progress Notes (Signed)
[facility] OFFICE PROGRESS NOTE   Diagnosis: Colon cancer  INTERVAL HISTORY:   Lawrence Villa returns as scheduled. He continues treatment with every 2 week irinotecan/panitumumab. He has diarrhea following chemotherapy. The diarrhea is relieved with Imodium. He has a rash over the scalp and trunk. He does not have significant pruritus. He continues minocycline. He developed an ulcer under the tongue prior to the last cycle of chemotherapy. No mouth ulcers at present. The abdominal pain has improved.  Objective:  Vital signs in last 24 hours:  Blood pressure 116/72, pulse 79, temperature 97.9 F (36.6 C), temperature source Oral, resp. rate 18, height 6' (1.829 m), weight 195 lb 9.6 oz (88.724 kg), SpO2 99.00%.    HEENT: No thrush or ulcers, mild angular chelitis Resp: Lungs clear bilaterally Cardio: Regular rate and rhythm GI: No hepatomegaly, nontender Vascular: No leg edema  Skin: Acne type rash over the face, scalp, and proximal. A few linear ulcerations surrounding the fingertips. Minimal skin thickening and callus formation at the soles   Portacath/PICC-without erythema  Lab Results:  Lab Results  Component Value Date   WBC 11.4* 12/16/2013   HGB 13.6 12/16/2013   HCT 42.0 12/16/2013   MCV 95.9 12/16/2013   PLT 245 12/16/2013   NEUTROABS 8.1* 12/16/2013    Lab Results  Component Value Date   CEA 13.3* 11/20/2013   Medications: I have reviewed the patient's current medications.  Assessment/Plan: 1. Metastatic colon cancer status post low anterior resection on 12/22/2011 (T4, N2, M1). K-ras wild-type (extended K-ras testing with no K-ras, NRAS, or BRAF mutation identified). Initiation of CAPOX/Avastin on 01/23/2012. The CEA was in normal range on 05/28/2012. Restaging CT 04/12/2012 after 4 cycles of CAPOX/Avastin confirmed improvement in the hepatic metastases. He completed cycle 7 CAPOX/Avastin beginning 06/18/2012. He completed cycle 8 systemic therapy with Xeloda  and Avastin (oxaliplatin discontinued) beginning 07/09/2012. He completed cycle 9 beginning 07/30/2012 and cycle 10 beginning on 08/20/2012. Restaging CT evaluation 08/13/2012 with further improvement in liver metastases. He began another cycle of Xeloda on 09/10/2012. He declined further Avastin. He completed a cycle of dose reduced Xeloda beginning on 10/01/2012 and was then maintained off of systemic therapy. Systemic therapy resumed with Xeloda/Avastin 12/31/2012 (completed 3 days of Xeloda with cycle 1). The Xeloda was dose reduced with the cycle of chemotherapy beginning 04/01/2013 Restaging CT 04/14/2013, compared to 08/13/2012 revealed a mixed response in the liver  MRI of the abdomen 05/02/2013 confirmed a single calcified left hepatic lobe lesion and multiple peripherally enhancing right hepatic lobe lesions.  Diagnostic laparoscopy 06/10/2013 with findings of adhesions to midline; numerous lesions seen on the right liver with the largest on the dome; peritoneal nodules near right liver mass; small firm areas in left liver; small left liver. 2 biopsies were obtained from the left liver and a peritoneal nodule seen over the right sided liver lesion was also biopsied. Anterior liver core biopsy showed benign liver tissue with mild steatosis; no evidence of malignancy. Posterior liver wedge biopsy showed benign liver tissue; no evidence of malignancy. 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  CEA 06/17/2013 increased at 15.5, 24 on 07/22/2013  Cycle 1 of FOLFIRI/Avastin 07/22/2013.  Cycle 2 FOLFIRI/Avastin 08/05/2013.  Cycle 3 FOLFIRI/Avastin 08/20/2013.  CEA improved at 20.4 on 08/20/2013.  Cycle 4 FOLFIRI/Avastin 09/02/2013.  Cycle 5 FOLFIRI/Avastin 09/17/2013  Cycle 6 FOLFIRI/Avastin 09/30/2013  CT 10/10/2013 with progression of liver metastases.  Initiation of irinotecan/Panitumumab on a 2 week schedule 10/21/2013.  CEA stable at 13.3 on  11/20/2013.  Cycle 3 irinotecan/Panitumumab 11/20/2013. 2. Microcytic anemia-resolved. 3. History of weight loss secondary to metastatic colon cancer.  4. Status post Port-A-Cath placement 01/10/2012. 5. Hand-foot syndrome secondary to Xeloda. The Xeloda was dose reduced to 1000 mg twice daily beginning with cycle 4 CAPOX. The hand-foot symptoms progressed. The Xeloda was further dose reduced beginning with treatment on 04/01/2013. Most recent Xeloda dose 1000 mg twice daily. 6. Glaucoma-now on medical therapy. 7. Neutropenia-Neulasta was added with cycle 2 FOLFIRI. 8. History of Rectal bleeding with bowel movements-? Hemorrhoid bleeding,? Related to Avastin.  9. Acne-like rash related to Panitumumab. He will continue minocycline.  Disposition:  His overall status appears unchanged. He has completed 4 treatments with irinotecan/panitumumab. He will contact us if the Imodium does not relieve the diarrhea. Lawrence Villa will return for an office visit and chemotherapy in 2 weeks. We will schedule a restaging CT after 6 treatments with irinotecan/panitumumab.  Betsy Coder, MD  12/16/2013  6:25 PM

## 2013-12-17 LAB — CEA: CEA: 14.2 ng/mL — AB (ref 0.0–5.0)

## 2013-12-18 ENCOUNTER — Telehealth: Payer: Self-pay | Admitting: *Deleted

## 2013-12-18 NOTE — Telephone Encounter (Signed)
Call from pt requesting CEA results. Stable at 14.2. He voiced understanding.

## 2013-12-20 ENCOUNTER — Other Ambulatory Visit: Payer: Self-pay | Admitting: Oncology

## 2013-12-20 DIAGNOSIS — C19 Malignant neoplasm of rectosigmoid junction: Secondary | ICD-10-CM

## 2013-12-23 ENCOUNTER — Telehealth: Payer: Self-pay | Admitting: *Deleted

## 2013-12-23 NOTE — Telephone Encounter (Signed)
Return call from Dr. Benay Spice : Prefers to wait till after 6th treatment, but agrees to have scan done after the 5th treatment next week, but no sooner. His CEA is stable.

## 2013-12-23 NOTE — Telephone Encounter (Signed)
Called to ask if he could have his CT scan this week or Monday instead of waiting? Made him aware that MD wants to wait till after he has 6 treatments before he re-scans and he has only had 4 thus far. He understands this and asks for nurse to make MD aware of his request. Notified him that Dr. Benay Spice is out of the office, but will let him know request if he calls in or emails.

## 2013-12-24 NOTE — Telephone Encounter (Signed)
Per Dr. Benay Spice; notified pt that MD prefers to wait till after 6th treatment, but agrees to have scan done after the 5th tx next week, but no sooner; CEA is stable.  Pt verbalized understanding of information and expressed appreciation for call back.

## 2013-12-25 ENCOUNTER — Other Ambulatory Visit: Payer: Self-pay | Admitting: *Deleted

## 2013-12-25 DIAGNOSIS — C19 Malignant neoplasm of rectosigmoid junction: Secondary | ICD-10-CM

## 2013-12-25 NOTE — Progress Notes (Signed)
Re-counted and confirmed that last chemo was his 5th cycle of panitumumab/irinotecan. Orders placed for restaging CT A/P with contrast.

## 2013-12-28 ENCOUNTER — Other Ambulatory Visit: Payer: Self-pay | Admitting: Oncology

## 2013-12-29 ENCOUNTER — Ambulatory Visit (HOSPITAL_COMMUNITY)
Admission: RE | Admit: 2013-12-29 | Discharge: 2013-12-29 | Disposition: A | Discharge status: Home / Self Care | Payer: Federal, State, Local not specified - PPO | Source: Ambulatory Visit | Attending: Oncology | Admitting: Oncology

## 2013-12-29 ENCOUNTER — Encounter (HOSPITAL_COMMUNITY): Payer: Self-pay

## 2013-12-29 DIAGNOSIS — C787 Secondary malignant neoplasm of liver and intrahepatic bile duct: Secondary | ICD-10-CM | POA: Diagnosis present

## 2013-12-29 DIAGNOSIS — C19 Malignant neoplasm of rectosigmoid junction: Secondary | ICD-10-CM | POA: Diagnosis present

## 2013-12-29 MED ORDER — IOHEXOL 300 MG/ML  SOLN
100.0000 mL | Freq: Once | INTRAMUSCULAR | Status: AC | PRN
  Administered 2013-12-29: 100 mL via INTRAVENOUS

## 2013-12-31 ENCOUNTER — Telehealth: Payer: Self-pay | Admitting: Oncology

## 2013-12-31 ENCOUNTER — Other Ambulatory Visit (HOSPITAL_BASED_OUTPATIENT_CLINIC_OR_DEPARTMENT_OTHER): Payer: Federal, State, Local not specified - PPO

## 2013-12-31 ENCOUNTER — Other Ambulatory Visit: Payer: Self-pay | Admitting: *Deleted

## 2013-12-31 ENCOUNTER — Ambulatory Visit (HOSPITAL_BASED_OUTPATIENT_CLINIC_OR_DEPARTMENT_OTHER): Payer: Federal, State, Local not specified - PPO | Admitting: Oncology

## 2013-12-31 ENCOUNTER — Telehealth: Payer: Self-pay | Admitting: *Deleted

## 2013-12-31 ENCOUNTER — Ambulatory Visit (HOSPITAL_BASED_OUTPATIENT_CLINIC_OR_DEPARTMENT_OTHER): Payer: Federal, State, Local not specified - PPO

## 2013-12-31 VITALS — BP 125/75 | HR 82 | Temp 98.3°F | Resp 18 | Ht 72.0 in | Wt 192.3 lb

## 2013-12-31 DIAGNOSIS — C19 Malignant neoplasm of rectosigmoid junction: Secondary | ICD-10-CM

## 2013-12-31 DIAGNOSIS — Z5111 Encounter for antineoplastic chemotherapy: Secondary | ICD-10-CM

## 2013-12-31 DIAGNOSIS — C787 Secondary malignant neoplasm of liver and intrahepatic bile duct: Secondary | ICD-10-CM

## 2013-12-31 DIAGNOSIS — C187 Malignant neoplasm of sigmoid colon: Secondary | ICD-10-CM

## 2013-12-31 DIAGNOSIS — D709 Neutropenia, unspecified: Secondary | ICD-10-CM

## 2013-12-31 DIAGNOSIS — R21 Rash and other nonspecific skin eruption: Secondary | ICD-10-CM

## 2013-12-31 DIAGNOSIS — K7689 Other specified diseases of liver: Secondary | ICD-10-CM

## 2013-12-31 DIAGNOSIS — L270 Generalized skin eruption due to drugs and medicaments taken internally: Secondary | ICD-10-CM

## 2013-12-31 LAB — COMPREHENSIVE METABOLIC PANEL (CC13)
ALT: 23 U/L (ref 0–55)
AST: 47 U/L — ABNORMAL HIGH (ref 5–34)
Albumin: 3.3 g/dL — ABNORMAL LOW (ref 3.5–5.0)
Alkaline Phosphatase: 171 U/L — ABNORMAL HIGH (ref 40–150)
Anion Gap: 8 mEq/L (ref 3–11)
BUN: 11 mg/dL (ref 7.0–26.0)
CALCIUM: 8.9 mg/dL (ref 8.4–10.4)
CHLORIDE: 105 meq/L (ref 98–109)
CO2: 26 mEq/L (ref 22–29)
Creatinine: 0.8 mg/dL (ref 0.7–1.3)
GLUCOSE: 80 mg/dL (ref 70–140)
POTASSIUM: 4.5 meq/L (ref 3.5–5.1)
SODIUM: 139 meq/L (ref 136–145)
TOTAL PROTEIN: 6.6 g/dL (ref 6.4–8.3)
Total Bilirubin: 0.84 mg/dL (ref 0.20–1.20)

## 2013-12-31 LAB — CBC WITH DIFFERENTIAL/PLATELET
BASO%: 1.4 % (ref 0.0–2.0)
BASOS ABS: 0.1 10*3/uL (ref 0.0–0.1)
EOS%: 6 % (ref 0.0–7.0)
Eosinophils Absolute: 0.2 10*3/uL (ref 0.0–0.5)
HEMATOCRIT: 36.5 % — AB (ref 38.4–49.9)
HEMOGLOBIN: 12 g/dL — AB (ref 13.0–17.1)
LYMPH#: 1.3 10*3/uL (ref 0.9–3.3)
LYMPH%: 34.6 % (ref 14.0–49.0)
MCH: 30.9 pg (ref 27.2–33.4)
MCHC: 33 g/dL (ref 32.0–36.0)
MCV: 93.6 fL (ref 79.3–98.0)
MONO#: 0.7 10*3/uL (ref 0.1–0.9)
MONO%: 17 % — ABNORMAL HIGH (ref 0.0–14.0)
NEUT#: 1.6 10*3/uL (ref 1.5–6.5)
NEUT%: 41 % (ref 39.0–75.0)
PLATELETS: 439 10*3/uL — AB (ref 140–400)
RBC: 3.9 10*6/uL — ABNORMAL LOW (ref 4.20–5.82)
RDW: 14.9 % — ABNORMAL HIGH (ref 11.0–14.6)
WBC: 3.9 10*3/uL — ABNORMAL LOW (ref 4.0–10.3)

## 2013-12-31 LAB — MAGNESIUM (CC13): Magnesium: 1.5 mg/dl (ref 1.5–2.5)

## 2013-12-31 MED ORDER — DEXAMETHASONE SODIUM PHOSPHATE 10 MG/ML IJ SOLN
10.0000 mg | Freq: Once | INTRAMUSCULAR | Status: AC
  Administered 2013-12-31: 10 mg via INTRAVENOUS

## 2013-12-31 MED ORDER — CAPECITABINE 500 MG PO TABS: ORAL_TABLET | ORAL | Status: DC

## 2013-12-31 MED ORDER — ONDANSETRON 8 MG/NS 50 ML IVPB
INTRAVENOUS | Status: AC
  Filled 2013-12-31: qty 8

## 2013-12-31 MED ORDER — ONDANSETRON 8 MG/50ML IVPB (CHCC)
8.0000 mg | Freq: Once | INTRAVENOUS | Status: AC
  Administered 2013-12-31: 8 mg via INTRAVENOUS

## 2013-12-31 MED ORDER — DEXTROSE 5 % IV SOLN
Freq: Once | INTRAVENOUS | Status: AC
  Administered 2013-12-31: 12:00:00 via INTRAVENOUS

## 2013-12-31 MED ORDER — DEXAMETHASONE SODIUM PHOSPHATE 10 MG/ML IJ SOLN
INTRAMUSCULAR | Status: AC
  Filled 2013-12-31: qty 1

## 2013-12-31 MED ORDER — SODIUM CHLORIDE 0.9 % IJ SOLN
10.0000 mL | INTRAMUSCULAR | Status: DC | PRN
  Administered 2013-12-31: 10 mL
  Filled 2013-12-31: qty 10

## 2013-12-31 MED ORDER — OXALIPLATIN CHEMO INJECTION 100 MG/20ML
100.0000 mg/m2 | Freq: Once | INTRAVENOUS | Status: AC
  Administered 2013-12-31: 210 mg via INTRAVENOUS
  Filled 2013-12-31: qty 42

## 2013-12-31 MED ORDER — HEPARIN SOD (PORK) LOCK FLUSH 100 UNIT/ML IV SOLN
500.0000 [IU] | Freq: Once | INTRAVENOUS | Status: AC | PRN
  Administered 2013-12-31: 500 [IU]
  Filled 2013-12-31: qty 5

## 2013-12-31 NOTE — Addendum Note (Signed)
Addended by: Domenic Schwab on: 12/31/2013 12:03 PM   Modules accepted: Orders

## 2013-12-31 NOTE — Telephone Encounter (Signed)
Pt confirmed labs/ov per 08/19 POF, gave pt AVS....KJ °

## 2013-12-31 NOTE — Telephone Encounter (Signed)
Per staff phone call and POF I have schedueld appts. Scheduler advised of appts.  JMW  

## 2013-12-31 NOTE — Patient Instructions (Addendum)
[facility] Discharge Instructions for Patients Receiving Chemotherapy  Today you received the following chemotherapy agents  oxaliplatin.  To help prevent nausea and vomiting after your treatment, we encourage you to take your nausea medication  As directed.   If you develop nausea and vomiting that is not controlled by your nausea medication, call the clinic.   BELOW ARE SYMPTOMS THAT SHOULD BE REPORTED IMMEDIATELY:  *FEVER GREATER THAN 100.5 F  *CHILLS WITH OR WITHOUT FEVER  NAUSEA AND VOMITING THAT IS NOT CONTROLLED WITH YOUR NAUSEA MEDICATION  *UNUSUAL SHORTNESS OF BREATH  *UNUSUAL BRUISING OR BLEEDING  TENDERNESS IN MOUTH AND THROAT WITH OR WITHOUT PRESENCE OF ULCERS  *URINARY PROBLEMS  *BOWEL PROBLEMS  UNUSUAL RASH Items with * indicate a potential emergency and should be followed up as soon as possible.  Feel free to call the clinic you have any questions or concerns. The clinic phone number is [phone].   Oxaliplatin Injection What is this medicine? OXALIPLATIN (ox AL i PLA tin) is a chemotherapy drug. It targets fast dividing cells, like cancer cells, and causes these cells to die. This medicine is used to treat cancers of the colon and rectum, and many other cancers. This medicine may be used for other purposes; ask your health care provider or pharmacist if you have questions. COMMON BRAND NAME(S): Eloxatin What should I tell my health care provider before I take this medicine? They need to know if you have any of these conditions: -kidney disease -an unusual or allergic reaction to oxaliplatin, other chemotherapy, other medicines, foods, dyes, or preservatives -pregnant or trying to get pregnant -breast-feeding How should I use this medicine? This drug is given as an infusion into a vein. It is administered in a hospital or clinic by a specially trained health care professional. Talk to your pediatrician regarding the use of this medicine  in children. Special care may be needed. Overdosage: If you think you have taken too much of this medicine contact a poison control center or emergency room at once. NOTE: This medicine is only for you. Do not share this medicine with others. What if I miss a dose? It is important not to miss a dose. Call your doctor or health care professional if you are unable to keep an appointment. What may interact with this medicine? -medicines to increase blood counts like filgrastim, pegfilgrastim, sargramostim -probenecid -some antibiotics like amikacin, gentamicin, neomycin, polymyxin B, streptomycin, tobramycin -zalcitabine Talk to your doctor or health care professional before taking any of these medicines: -acetaminophen -aspirin -ibuprofen -ketoprofen -naproxen This list may not describe all possible interactions. Give your health care provider a list of all the medicines, herbs, non-prescription drugs, or dietary supplements you use. Also tell them if you smoke, drink alcohol, or use illegal drugs. Some items may interact with your medicine. What should I watch for while using this medicine? Your condition will be monitored carefully while you are receiving this medicine. You will need important blood work done while you are taking this medicine. This medicine can make you more sensitive to cold. Do not drink cold drinks or use ice. Cover exposed skin before coming in contact with cold temperatures or cold objects. When out in cold weather wear warm clothing and cover your mouth and nose to warm the air that goes into your lungs. Tell your doctor if you get sensitive to the cold. This drug may make you feel generally unwell. This is not uncommon, as chemotherapy can affect healthy  cells as well as cancer cells. Report any side effects. Continue your course of treatment even though you feel ill unless your doctor tells you to stop. In some cases, you may be given additional medicines to help with  side effects. Follow all directions for their use. Call your doctor or health care professional for advice if you get a fever, chills or sore throat, or other symptoms of a cold or flu. Do not treat yourself. This drug decreases your body's ability to fight infections. Try to avoid being around people who are sick. This medicine may increase your risk to bruise or bleed. Call your doctor or health care professional if you notice any unusual bleeding. Be careful brushing and flossing your teeth or using a toothpick because you may get an infection or bleed more easily. If you have any dental work done, tell your dentist you are receiving this medicine. Avoid taking products that contain aspirin, acetaminophen, ibuprofen, naproxen, or ketoprofen unless instructed by your doctor. These medicines may hide a fever. Do not become pregnant while taking this medicine. Women should inform their doctor if they wish to become pregnant or think they might be pregnant. There is a potential for serious side effects to an unborn child. Talk to your health care professional or pharmacist for more information. Do not breast-feed an infant while taking this medicine. Call your doctor or health care professional if you get diarrhea. Do not treat yourself. What side effects may I notice from receiving this medicine? Side effects that you should report to your doctor or health care professional as soon as possible: -allergic reactions like skin rash, itching or hives, swelling of the face, lips, or tongue -low blood counts - This drug may decrease the number of white blood cells, red blood cells and platelets. You may be at increased risk for infections and bleeding. -signs of infection - fever or chills, cough, sore throat, pain or difficulty passing urine -signs of decreased platelets or bleeding - bruising, pinpoint red spots on the skin, black, tarry stools, nosebleeds -signs of decreased red blood cells - unusually  weak or tired, fainting spells, lightheadedness -breathing problems -chest pain, pressure -cough -diarrhea -jaw tightness -mouth sores -nausea and vomiting -pain, swelling, redness or irritation at the injection site -pain, tingling, numbness in the hands or feet -problems with balance, talking, walking -redness, blistering, peeling or loosening of the skin, including inside the mouth -trouble passing urine or change in the amount of urine Side effects that usually do not require medical attention (report to your doctor or health care professional if they continue or are bothersome): -changes in vision -constipation -hair loss -loss of appetite -metallic taste in the mouth or changes in taste -stomach pain This list may not describe all possible side effects. Call your doctor for medical advice about side effects. You may report side effects to FDA at 1-800-FDA-1088. Where should I keep my medicine? This drug is given in a hospital or clinic and will not be stored at home. NOTE: This sheet is a summary. It may not cover all possible information. If you have questions about this medicine, talk to your doctor, pharmacist, or health care provider.  2015, [employer]vier/Gold Standard. (2007-11-26 17:22:47)

## 2013-12-31 NOTE — Progress Notes (Signed)
[facility] OFFICE PROGRESS NOTE   Diagnosis: Colon cancer  INTERVAL HISTORY:   Lawrence Villa returns as scheduled. He completed another treatment with irinotecan/panitumumab 1 on 12/16/2013. No nausea/vomiting or diarrhea. He reports malaise. Mild pain in the left abdomen. He continues to work. Stable skin rash. Soreness at the lower lip. No neuropathy symptoms in the extremities.  Objective:  Vital signs in last 24 hours:  Blood pressure 125/75, pulse 82, temperature 98.3 F (36.8 C), resp. rate 18, height 6' (1.829 m), weight 192 lb 4.8 oz (87.227 kg).    HEENT: No thrush or ulcers, mild angular key light is an erythema at the lower lip Lymphatics: No cervical, supraclavicular, or axillary nodes Resp: Lungs clear bilaterally Cardio: Regular rate and rhythm GI: No hepatomegaly, nontender, no mass Vascular: No leg edema  Skin: Mild acne type rash over the face and trunk, hand and feet without skin breakdown   Portacath/PICC-without erythema  Lab Results:  Lab Results  Component Value Date   WBC 3.9* 12/31/2013   HGB 12.0* 12/31/2013   HCT 36.5* 12/31/2013   MCV 93.6 12/31/2013   PLT 439* 12/31/2013   NEUTROABS 1.6 12/31/2013     Lab Results  Component Value Date   CEA 14.2* 12/16/2013    Imaging:  Ct Abdomen Pelvis W Contrast  12/29/2013   CLINICAL DATA:  Colon cancer with liver mets.  EXAM: CT ABDOMEN AND PELVIS WITH CONTRAST  TECHNIQUE: Multidetector CT imaging of the abdomen and pelvis was performed using the standard protocol following bolus administration of intravenous contrast.  CONTRAST:  152mL OMNIPAQUE IOHEXOL 300 MG/ML  SOLN  COMPARISON:  10/10/2013.  FINDINGS: Lung Bases: Unremarkable.  Liver: The index lesion in the anterior dome of the liver which was 3.0 x 3.6 cm on the previous study now measures 2.8 x 3.0 cm. The large irregular lesion in the dome of the liver measured previously on axial images at 8.7 x 5.3 cm now measures 13.0 x 7.1 cm. Coronal  reformats better demonstrate the progression of this dominant right liver lesion as well as the interval development of multiple satellite lesions around the dominant mass (see image 54 of series 602). Well-defined low-density lesion in the lateral segment left liver which was 2.5 x 1.8 cm on the previous study now measures 2.0 x 2.6 cm. This measures water attenuation and may be a cyst. Densely calcified lesion in the inferior right liver lesion is 4.5 x 3.2 cm today compared to 4.5 x 3.1 cm previously.  Spleen: No splenomegaly. No focal mass lesion.  Stomach: Nondistended. No gastric wall thickening. No evidence of outlet obstruction.  Pancreas: No focal mass lesion. No dilatation of the main duct. No intraparenchymal cyst. No peripancreatic edema.  Gallbladder/Biliary: No evidence for gallstones. No pericholecystic fluid. No intrahepatic or extrahepatic biliary dilation.  Kidneys/Adrenals: 1.9 x 1.0 cm right adrenal nodule is new in the interval. Left adrenal gland is normal. No abnormality is seen in either kidney.  Bowel Loops: Duodenum is normally positioned as is the ligament of Treitz. No small bowel obstruction. Terminal ileum is normal. The appendix is normal. Suture line noted in the sigmoid colon.  Nodes: 3.7 x 2.0 cm hepatoduodenal ligament lymph node (image 26) was 2.4 x 1.4 cm previously. No retroperitoneal lymphadenopathy in the abdomen. No pelvic sidewall lymphadenopathy.  Vasculature: No abdominal aortic aneurysm.  Pelvic Genitourinary: Bladder is nondistended. Prostate gland is unremarkable.  Bones/Musculoskeletal: Bone windows reveal no worrisome lytic or sclerotic osseous lesions.  Body  Wall: Small umbilical and left inguinal hernias contain only fat.  Other: No intraperitoneal free fluid.  IMPRESSION: Interval progression of metastatic disease in the liver and hepatoduodenal ligament.   Electronically Signed   By: Kennith Center M.D.   On: 12/29/2013 17:55    Medications: I have reviewed the  patient's current medications.  Assessment/Plan: 1. Metastatic colon cancer status post low anterior resection on 12/22/2011 (T4, N2, M1). K-ras wild-type (extended K-ras testing with no K-ras, NRAS, or BRAF mutation identified). Initiation of CAPOX/Avastin on 01/23/2012. The CEA was in normal range on 05/28/2012. Restaging CT 04/12/2012 after 4 cycles of CAPOX/Avastin confirmed improvement in the hepatic metastases. He completed cycle 7 CAPOX/Avastin beginning 06/18/2012. He completed cycle 8 systemic therapy with Xeloda and Avastin (oxaliplatin discontinued) beginning 07/09/2012. He completed cycle 9 beginning 07/30/2012 and cycle 10 beginning on 08/20/2012. Restaging CT evaluation 08/13/2012 with further improvement in liver metastases. He began another cycle of Xeloda on 09/10/2012. He declined further Avastin. He completed a cycle of dose reduced Xeloda beginning on 10/01/2012 and was then maintained off of systemic therapy. Systemic therapy resumed with Xeloda/Avastin 12/31/2012 (completed 3 days of Xeloda with cycle 1). The Xeloda was dose reduced with the cycle of chemotherapy beginning 04/01/2013  The invasive adenocarcinoma from the sigmoid colon mass biopsy 12/15/2011 returned microsatellite stable with no loss of mismatch repair protein expression Restaging CT 04/14/2013, compared to 08/13/2012 revealed a mixed response in the liver  MRI of the abdomen 05/02/2013 confirmed a single calcified left hepatic lobe lesion and multiple peripherally enhancing right hepatic lobe lesions.  Diagnostic laparoscopy 06/10/2013 with findings of adhesions to midline; numerous lesions seen on the right liver with the largest on the dome; peritoneal nodules near right liver mass; small firm areas in left liver; small left liver. 2 biopsies were obtained from the left liver and a peritoneal nodule seen over the right sided liver lesion was also biopsied. Anterior liver core biopsy showed benign liver tissue with  mild steatosis; no evidence of malignancy. Posterior liver wedge biopsy showed benign liver tissue; no evidence of malignancy. 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  CEA 06/17/2013 increased at 15.5, 24 on 07/22/2013  Cycle 1 of FOLFIRI/Avastin 07/22/2013.  Cycle 2 FOLFIRI/Avastin 08/05/2013.  Cycle 3 FOLFIRI/Avastin 08/20/2013.  CEA improved at 20.4 on 08/20/2013.  Cycle 4 FOLFIRI/Avastin 09/02/2013.  Cycle 5 FOLFIRI/Avastin 09/17/2013  Cycle 6 FOLFIRI/Avastin 09/30/2013  CT 10/10/2013 with progression of liver metastases.  Initiation of irinotecan/Panitumumab on a 2 week schedule 10/21/2013.  Restaging CT 12/29/2013 with progression of a dominant hepatic dome lesion with new satellite lesions and enlargement of a hepatoduodenal ligament lymph node Cycle 1 salvage CAPOX 12/31/2013 2. Microcytic anemia-resolved. 3. History of weight loss secondary to metastatic colon cancer.  4. Status post Port-A-Cath placement 01/10/2012. 5. Hand-foot syndrome secondary to Xeloda. The Xeloda was dose reduced to 1000 mg twice daily beginning with cycle 4 CAPOX. The hand-foot symptoms progressed. The Xeloda was further dose reduced beginning with treatment on 04/01/2013. Most recent Xeloda dose 1000 mg twice daily. 6. Glaucoma-now on medical therapy. 7. Neutropenia-Neulasta was added with cycle 2 FOLFIRI. 8. History of Rectal bleeding with bowel movements-? Hemorrhoid bleeding,? Related to Avastin.  9. Acne-like rash related to Panitumumab.   Disposition:  Lawrence Villa has been treated with irinotecan/panitumumab for the past 2 months. The CEA was slightly higher 2 weeks ago and a restaging CT is consistent with disease progression. I reviewed the CT images and discussed treatment  options with Lawrence Villa. We discussed repeat treatment with oxaliplatin-based chemotherapy (he last received oxaliplatin in February of 2014), regorafenib, hepatic directed  therapy, and referral for a clinical trial.  We decided to proceed with a trial of CAPOX. He is comfortable with this plan and agrees to proceed. We again reviewed the potential toxicities associated with this regimen including the chance for neuropathy, an allergic reaction, hematologic toxicity, and hand/foot syndrome. The plan is to begin a first cycle of CAPOX today.  I will present his case at the GI tumor conference to discuss the indication for concurrent radioembolization therapy. He stated that he is not interested in hepatic surgery.  Betsy Coder, MD  12/31/2013  10:28 AM

## 2014-01-01 ENCOUNTER — Encounter: Payer: Self-pay | Admitting: Oncology

## 2014-01-02 NOTE — Progress Notes (Signed)
XELODA RX TO [facility].  CALLED 01/02/14 AND IT HAD NOT SHIPPED YET AND HE HAS A 0.00 COPAY.

## 2014-01-09 ENCOUNTER — Telehealth: Payer: Self-pay | Admitting: Oncology

## 2014-01-09 NOTE — Telephone Encounter (Signed)
Sent msg to Dr. Benay Spice concerning ov/labs for 09/01 and 09/08 per 08/04 and 08/19 POF, pt cld lft vm stating he was not coming in on 09/01 that this apt should have been cancelled.Marland Kitchen..KJ

## 2014-01-12 ENCOUNTER — Telehealth: Payer: Self-pay | Admitting: *Deleted

## 2014-01-12 ENCOUNTER — Telehealth: Payer: Self-pay | Admitting: Oncology

## 2014-01-12 NOTE — Telephone Encounter (Signed)
Per scheduler patient canceled his appt for tomorrow

## 2014-01-12 NOTE — Telephone Encounter (Signed)
Pt cld to state he was not coming in on the 09/01 apt that he had one for 09/08, sent msg to [facility] for approval and advised to keep 09/08 apt.Marland Kitchen..KJ

## 2014-01-13 ENCOUNTER — Other Ambulatory Visit: Payer: Federal, State, Local not specified - PPO

## 2014-01-13 ENCOUNTER — Ambulatory Visit: Payer: Federal, State, Local not specified - PPO

## 2014-01-13 ENCOUNTER — Ambulatory Visit: Payer: Federal, State, Local not specified - PPO | Admitting: Oncology

## 2014-01-20 ENCOUNTER — Other Ambulatory Visit: Payer: Self-pay | Admitting: Oncology

## 2014-01-20 ENCOUNTER — Ambulatory Visit (HOSPITAL_BASED_OUTPATIENT_CLINIC_OR_DEPARTMENT_OTHER): Payer: Federal, State, Local not specified - PPO | Admitting: Oncology

## 2014-01-20 ENCOUNTER — Telehealth: Payer: Self-pay | Admitting: Oncology

## 2014-01-20 ENCOUNTER — Other Ambulatory Visit: Payer: Self-pay | Admitting: *Deleted

## 2014-01-20 ENCOUNTER — Other Ambulatory Visit (HOSPITAL_BASED_OUTPATIENT_CLINIC_OR_DEPARTMENT_OTHER): Payer: Federal, State, Local not specified - PPO

## 2014-01-20 ENCOUNTER — Ambulatory Visit (HOSPITAL_BASED_OUTPATIENT_CLINIC_OR_DEPARTMENT_OTHER): Payer: Federal, State, Local not specified - PPO

## 2014-01-20 ENCOUNTER — Telehealth: Payer: Self-pay | Admitting: *Deleted

## 2014-01-20 VITALS — BP 108/71 | HR 80 | Temp 98.2°F | Resp 22 | Ht 72.0 in | Wt 189.6 lb

## 2014-01-20 DIAGNOSIS — C19 Malignant neoplasm of rectosigmoid junction: Secondary | ICD-10-CM

## 2014-01-20 DIAGNOSIS — C189 Malignant neoplasm of colon, unspecified: Secondary | ICD-10-CM

## 2014-01-20 DIAGNOSIS — C787 Secondary malignant neoplasm of liver and intrahepatic bile duct: Secondary | ICD-10-CM

## 2014-01-20 DIAGNOSIS — Z5111 Encounter for antineoplastic chemotherapy: Secondary | ICD-10-CM

## 2014-01-20 LAB — CBC WITH DIFFERENTIAL/PLATELET
BASO%: 0.6 % (ref 0.0–2.0)
BASOS ABS: 0.1 10*3/uL (ref 0.0–0.1)
EOS%: 1.1 % (ref 0.0–7.0)
Eosinophils Absolute: 0.1 10*3/uL (ref 0.0–0.5)
HCT: 38.6 % (ref 38.4–49.9)
HGB: 12.5 g/dL — ABNORMAL LOW (ref 13.0–17.1)
LYMPH#: 1.9 10*3/uL (ref 0.9–3.3)
LYMPH%: 18.6 % (ref 14.0–49.0)
MCH: 29.9 pg (ref 27.2–33.4)
MCHC: 32.3 g/dL (ref 32.0–36.0)
MCV: 92.4 fL (ref 79.3–98.0)
MONO#: 1.2 10*3/uL — ABNORMAL HIGH (ref 0.1–0.9)
MONO%: 11.4 % (ref 0.0–14.0)
NEUT#: 7.1 10*3/uL — ABNORMAL HIGH (ref 1.5–6.5)
NEUT%: 68.3 % (ref 39.0–75.0)
Platelets: 296 10*3/uL (ref 140–400)
RBC: 4.17 10*6/uL — AB (ref 4.20–5.82)
RDW: 16.7 % — AB (ref 11.0–14.6)
WBC: 10.4 10*3/uL — ABNORMAL HIGH (ref 4.0–10.3)

## 2014-01-20 LAB — COMPREHENSIVE METABOLIC PANEL (CC13)
ALBUMIN: 3.1 g/dL — AB (ref 3.5–5.0)
ALT: 26 U/L (ref 0–55)
AST: 63 U/L — ABNORMAL HIGH (ref 5–34)
Alkaline Phosphatase: 198 U/L — ABNORMAL HIGH (ref 40–150)
Anion Gap: 9 mEq/L (ref 3–11)
BUN: 14.5 mg/dL (ref 7.0–26.0)
CALCIUM: 9.7 mg/dL (ref 8.4–10.4)
CHLORIDE: 104 meq/L (ref 98–109)
CO2: 26 mEq/L (ref 22–29)
Creatinine: 0.8 mg/dL (ref 0.7–1.3)
Glucose: 121 mg/dl (ref 70–140)
POTASSIUM: 4.5 meq/L (ref 3.5–5.1)
SODIUM: 138 meq/L (ref 136–145)
Total Bilirubin: 0.8 mg/dL (ref 0.20–1.20)
Total Protein: 7.3 g/dL (ref 6.4–8.3)

## 2014-01-20 LAB — MAGNESIUM (CC13): Magnesium: 2 mg/dl (ref 1.5–2.5)

## 2014-01-20 MED ORDER — ONDANSETRON 8 MG/50ML IVPB (CHCC)
8.0000 mg | Freq: Once | INTRAVENOUS | Status: AC
  Administered 2014-01-20: 8 mg via INTRAVENOUS

## 2014-01-20 MED ORDER — HYDROCODONE-ACETAMINOPHEN 5-325 MG PO TABS: 1.0000 | ORAL_TABLET | Freq: Four times a day (QID) | ORAL | Status: AC | PRN

## 2014-01-20 MED ORDER — HEPARIN SOD (PORK) LOCK FLUSH 100 UNIT/ML IV SOLN
500.0000 [IU] | Freq: Once | INTRAVENOUS | Status: AC | PRN
  Administered 2014-01-20: 500 [IU]
  Filled 2014-01-20: qty 5

## 2014-01-20 MED ORDER — OXALIPLATIN CHEMO INJECTION 100 MG/20ML
100.0000 mg/m2 | Freq: Once | INTRAVENOUS | Status: AC
  Administered 2014-01-20: 210 mg via INTRAVENOUS
  Filled 2014-01-20: qty 42

## 2014-01-20 MED ORDER — ONDANSETRON 8 MG/NS 50 ML IVPB
INTRAVENOUS | Status: AC
  Filled 2014-01-20: qty 8

## 2014-01-20 MED ORDER — DEXAMETHASONE SODIUM PHOSPHATE 10 MG/ML IJ SOLN
INTRAMUSCULAR | Status: AC
  Filled 2014-01-20: qty 1

## 2014-01-20 MED ORDER — SODIUM CHLORIDE 0.9 % IJ SOLN
10.0000 mL | INTRAMUSCULAR | Status: DC | PRN
  Administered 2014-01-20: 10 mL
  Filled 2014-01-20: qty 10

## 2014-01-20 MED ORDER — DEXAMETHASONE SODIUM PHOSPHATE 10 MG/ML IJ SOLN
10.0000 mg | Freq: Once | INTRAMUSCULAR | Status: AC
  Administered 2014-01-20: 10 mg via INTRAVENOUS

## 2014-01-20 MED ORDER — DEXTROSE 5 % IV SOLN
Freq: Once | INTRAVENOUS | Status: AC
  Administered 2014-01-20: 11:00:00 via INTRAVENOUS

## 2014-01-20 NOTE — Telephone Encounter (Signed)
Per staff message and POF I have scheduled appts. Advised scheduler of appts. JMW  

## 2014-01-20 NOTE — Telephone Encounter (Signed)
Pt confirmed labs/ov per 09/08 POF, sent msg to add chemo, gave pt AVS....KJ °

## 2014-01-20 NOTE — Progress Notes (Signed)
[place] OFFICE PROGRESS NOTE   Diagnosis: Colon cancer  INTERVAL HISTORY:   Lawrence Villa returns as scheduled. He began treatment with CAPOX 12/31/2013. He started the Xeloda several days after the oxaliplatin and completed Xeloda 01/13/2014. No mouth sores, hand/foot pain, diarrhea, or neuropathy symptoms. He continues to have intermittent pain in the right upper abdomen. He is not taking pain medication.  Objective:  Vital signs in last 24 hours:  Blood pressure 108/71, pulse 80, temperature 98.2 F (36.8 C), temperature source Oral, resp. rate 22, height 6' (1.829 m), weight 189 lb 9.6 oz (86.002 kg), SpO2 100.00%.    HEENT: No thrush or ulcers, erythema at the buccal mucosa bilaterally Resp: Lungs clear bilaterally Cardio: Regular rate and rhythm GI: No hepatomegaly, nontender Vascular: No leg edema  Skin: Palms and soles without erythema   Portacath/PICC-without erythema  Lab Results:  Lab Results  Component Value Date   WBC 10.4* 01/20/2014   HGB 12.5* 01/20/2014   HCT 38.6 01/20/2014   MCV 92.4 01/20/2014   PLT 296 01/20/2014   NEUTROABS 7.1* 01/20/2014      Lab Results  Component Value Date   CEA 14.2* 12/16/2013     Medications: I have reviewed the patient's current medications.  Assessment/Plan: 1. Metastatic colon cancer status post low anterior resection on 12/22/2011 (T4, N2, M1). K-ras wild-type (extended K-ras testing with no K-ras, NRAS, or BRAF mutation identified). Initiation of CAPOX/Avastin on 01/23/2012. The CEA was in normal range on 05/28/2012. Restaging CT 04/12/2012 after 4 cycles of CAPOX/Avastin confirmed improvement in the hepatic metastases. He completed cycle 7 CAPOX/Avastin beginning 06/18/2012. He completed cycle 8 systemic therapy with Xeloda and Avastin (oxaliplatin discontinued) beginning 07/09/2012. He completed cycle 9 beginning 07/30/2012 and cycle 10 beginning on 08/20/2012. Restaging CT evaluation 08/13/2012 with further  improvement in liver metastases. He began another cycle of Xeloda on 09/10/2012. He declined further Avastin. He completed a cycle of dose reduced Xeloda beginning on 10/01/2012 and was then maintained off of systemic therapy. Systemic therapy resumed with Xeloda/Avastin 12/31/2012 (completed 3 days of Xeloda with cycle 1). The Xeloda was dose reduced with the cycle of chemotherapy beginning 04/01/2013 The invasive adenocarcinoma from the sigmoid colon mass biopsy 12/15/2011 returned microsatellite stable with no loss of mismatch repair protein expression Restaging CT 04/14/2013, compared to 08/13/2012 revealed a mixed response in the liver  MRI of the abdomen 05/02/2013 confirmed a single calcified left hepatic lobe lesion and multiple peripherally enhancing right hepatic lobe lesions.  Diagnostic laparoscopy 06/10/2013 with findings of adhesions to midline; numerous lesions seen on the right liver with the largest on the dome; peritoneal nodules near right liver mass; small firm areas in left liver; small left liver. 2 biopsies were obtained from the left liver and a peritoneal nodule seen over the right sided liver lesion was also biopsied. Anterior liver core biopsy showed benign liver tissue with mild steatosis; no evidence of malignancy. Posterior liver wedge biopsy showed benign liver tissue; no evidence of malignancy. 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  CEA 06/17/2013 increased at 15.5, 24 on 07/22/2013  Cycle 1 of FOLFIRI/Avastin 07/22/2013.  Cycle 2 FOLFIRI/Avastin 08/05/2013.  Cycle 3 FOLFIRI/Avastin 08/20/2013.  CEA improved at 20.4 on 08/20/2013.  Cycle 4 FOLFIRI/Avastin 09/02/2013.  Cycle 5 FOLFIRI/Avastin 09/17/2013  Cycle 6 FOLFIRI/Avastin 09/30/2013  CT 10/10/2013 with progression of liver metastases.  Initiation of irinotecan/Panitumumab on a 2 week schedule 10/21/2013.  Restaging CT 12/29/2013 with progression of a dominant  hepatic dome lesion with new satellite lesions and enlargement of a hepatoduodenal ligament lymph node  Cycle 1 salvage CAPOX 12/31/2013 2. Microcytic anemia-resolved. 3. History of weight loss secondary to metastatic colon cancer.  4. Status post Port-A-Cath placement 01/10/2012. 5. Hand-foot syndrome secondary to Xeloda. The Xeloda was dose reduced to 1000 mg twice daily beginning with cycle 4 CAPOX. The hand-foot symptoms progressed. The Xeloda was further dose reduced beginning with treatment on 04/01/2013. Most recent Xeloda dose 1000 mg twice daily. 6. Glaucoma-now on medical therapy. 7. Neutropenia-Neulasta was added with cycle 2 FOLFIRI. 8. History of Rectal bleeding with bowel movements-? Hemorrhoid bleeding,? Related to Avastin.  9. Acne-like rash related to Panitumumab.   Disposition: He tolerated the first cycle of salvage CAPOX well. The plan is to proceed with cycle 2 today.  At present his case at the GI tumor 2 weeks ago. Dr. Barry Dienes recommended an MRI of the liver and then consideration of hepatic resection. Lawrence Villa indicates he does not wish to have surgery. He will agree to an evaluation by interventional radiology to consider radioembolization concurrent with the CAPOX. We made a referral today.  He will be given a prescription for hydrocodone to use as needed for right abdomen pain.  Lawrence Villa will return for an office visit and chemotherapy in 3 weeks.  Betsy Coder, MD  01/20/2014  10:04 AM

## 2014-01-20 NOTE — Patient Instructions (Signed)
[facility] Discharge Instructions for Patients Receiving Chemotherapy  Today you received the following chemotherapy agents: Oxaliplatin  To help prevent nausea and vomiting after your treatment, we encourage you to take your nausea medication as prescribed.    If you develop nausea and vomiting that is not controlled by your nausea medication, call the clinic.   BELOW ARE SYMPTOMS THAT SHOULD BE REPORTED IMMEDIATELY:  *FEVER GREATER THAN 100.5 F  *CHILLS WITH OR WITHOUT FEVER  NAUSEA AND VOMITING THAT IS NOT CONTROLLED WITH YOUR NAUSEA MEDICATION  *UNUSUAL SHORTNESS OF BREATH  *UNUSUAL BRUISING OR BLEEDING  TENDERNESS IN MOUTH AND THROAT WITH OR WITHOUT PRESENCE OF ULCERS  *URINARY PROBLEMS  *BOWEL PROBLEMS  UNUSUAL RASH Items with * indicate a potential emergency and should be followed up as soon as possible.  Feel free to call the clinic you have any questions or concerns. The clinic phone number is [phone].    

## 2014-01-22 ENCOUNTER — Other Ambulatory Visit: Payer: Self-pay | Admitting: *Deleted

## 2014-01-22 MED ORDER — CAPECITABINE 500 MG PO TABS: ORAL_TABLET | ORAL | Status: DC

## 2014-01-22 NOTE — Telephone Encounter (Signed)
Spoke with pt, he needs refill on Xeloda sent to [place].

## 2014-02-05 ENCOUNTER — Encounter: Payer: Self-pay | Admitting: Nutrition

## 2014-02-05 NOTE — Progress Notes (Signed)
Patient contacted me with questions regarding ionized or distilled water for drinking.  Is it better or helpful? Contacted patient by phone; received voice mail. Left message.  Mailed copy of education to patient.

## 2014-02-08 ENCOUNTER — Other Ambulatory Visit: Payer: Self-pay | Admitting: Oncology

## 2014-02-10 ENCOUNTER — Ambulatory Visit (HOSPITAL_BASED_OUTPATIENT_CLINIC_OR_DEPARTMENT_OTHER): Payer: Federal, State, Local not specified - PPO

## 2014-02-10 ENCOUNTER — Encounter: Payer: Self-pay | Admitting: Oncology

## 2014-02-10 ENCOUNTER — Telehealth: Payer: Self-pay | Admitting: Oncology

## 2014-02-10 ENCOUNTER — Ambulatory Visit (HOSPITAL_BASED_OUTPATIENT_CLINIC_OR_DEPARTMENT_OTHER): Payer: Federal, State, Local not specified - PPO | Admitting: Oncology

## 2014-02-10 ENCOUNTER — Other Ambulatory Visit (HOSPITAL_BASED_OUTPATIENT_CLINIC_OR_DEPARTMENT_OTHER): Payer: Federal, State, Local not specified - PPO

## 2014-02-10 ENCOUNTER — Other Ambulatory Visit: Payer: Self-pay | Admitting: Oncology

## 2014-02-10 VITALS — BP 124/90 | HR 106 | Temp 98.5°F | Resp 19 | Ht 72.0 in | Wt 186.9 lb

## 2014-02-10 DIAGNOSIS — C187 Malignant neoplasm of sigmoid colon: Secondary | ICD-10-CM

## 2014-02-10 DIAGNOSIS — R1011 Right upper quadrant pain: Secondary | ICD-10-CM

## 2014-02-10 DIAGNOSIS — D709 Neutropenia, unspecified: Secondary | ICD-10-CM

## 2014-02-10 DIAGNOSIS — R21 Rash and other nonspecific skin eruption: Secondary | ICD-10-CM

## 2014-02-10 DIAGNOSIS — C19 Malignant neoplasm of rectosigmoid junction: Secondary | ICD-10-CM

## 2014-02-10 DIAGNOSIS — Z5111 Encounter for antineoplastic chemotherapy: Secondary | ICD-10-CM

## 2014-02-10 DIAGNOSIS — C787 Secondary malignant neoplasm of liver and intrahepatic bile duct: Secondary | ICD-10-CM

## 2014-02-10 DIAGNOSIS — L270 Generalized skin eruption due to drugs and medicaments taken internally: Secondary | ICD-10-CM

## 2014-02-10 LAB — COMPREHENSIVE METABOLIC PANEL (CC13)
ALK PHOS: 205 U/L — AB (ref 40–150)
ALT: 19 U/L (ref 0–55)
AST: 83 U/L — ABNORMAL HIGH (ref 5–34)
Albumin: 2.6 g/dL — ABNORMAL LOW (ref 3.5–5.0)
Anion Gap: 8 mEq/L (ref 3–11)
BILIRUBIN TOTAL: 1.14 mg/dL (ref 0.20–1.20)
BUN: 11.8 mg/dL (ref 7.0–26.0)
CO2: 27 mEq/L (ref 22–29)
Calcium: 9.8 mg/dL (ref 8.4–10.4)
Chloride: 98 mEq/L (ref 98–109)
Creatinine: 0.8 mg/dL (ref 0.7–1.3)
Glucose: 158 mg/dl — ABNORMAL HIGH (ref 70–140)
POTASSIUM: 4.6 meq/L (ref 3.5–5.1)
Sodium: 133 mEq/L — ABNORMAL LOW (ref 136–145)
Total Protein: 7.7 g/dL (ref 6.4–8.3)

## 2014-02-10 LAB — CBC WITH DIFFERENTIAL/PLATELET
BASO%: 0.5 % (ref 0.0–2.0)
Basophils Absolute: 0 10*3/uL (ref 0.0–0.1)
EOS%: 0.2 % (ref 0.0–7.0)
Eosinophils Absolute: 0 10*3/uL (ref 0.0–0.5)
HCT: 32.9 % — ABNORMAL LOW (ref 38.4–49.9)
HGB: 10.7 g/dL — ABNORMAL LOW (ref 13.0–17.1)
LYMPH%: 14.3 % (ref 14.0–49.0)
MCH: 29.3 pg (ref 27.2–33.4)
MCHC: 32.6 g/dL (ref 32.0–36.0)
MCV: 90 fL (ref 79.3–98.0)
MONO#: 1 10*3/uL — ABNORMAL HIGH (ref 0.1–0.9)
MONO%: 11.1 % (ref 0.0–14.0)
NEUT%: 73.9 % (ref 39.0–75.0)
NEUTROS ABS: 6.5 10*3/uL (ref 1.5–6.5)
PLATELETS: 349 10*3/uL (ref 140–400)
RBC: 3.66 10*6/uL — AB (ref 4.20–5.82)
RDW: 18.6 % — ABNORMAL HIGH (ref 11.0–14.6)
WBC: 8.8 10*3/uL (ref 4.0–10.3)
lymph#: 1.3 10*3/uL (ref 0.9–3.3)

## 2014-02-10 MED ORDER — DEXAMETHASONE SODIUM PHOSPHATE 10 MG/ML IJ SOLN
10.0000 mg | Freq: Once | INTRAMUSCULAR | Status: AC
  Administered 2014-02-10: 10 mg via INTRAVENOUS

## 2014-02-10 MED ORDER — DEXTROSE 5 % IV SOLN
Freq: Once | INTRAVENOUS | Status: AC
  Administered 2014-02-10: 13:00:00 via INTRAVENOUS

## 2014-02-10 MED ORDER — SODIUM CHLORIDE 0.9 % IJ SOLN
10.0000 mL | INTRAMUSCULAR | Status: DC | PRN
  Administered 2014-02-10: 10 mL
  Filled 2014-02-10: qty 10

## 2014-02-10 MED ORDER — DEXAMETHASONE SODIUM PHOSPHATE 10 MG/ML IJ SOLN
INTRAMUSCULAR | Status: AC
  Filled 2014-02-10: qty 1

## 2014-02-10 MED ORDER — OXALIPLATIN CHEMO INJECTION 100 MG/20ML
100.0000 mg/m2 | Freq: Once | INTRAVENOUS | Status: AC
  Administered 2014-02-10: 210 mg via INTRAVENOUS
  Filled 2014-02-10: qty 42

## 2014-02-10 MED ORDER — ONDANSETRON 8 MG/NS 50 ML IVPB
INTRAVENOUS | Status: AC
  Filled 2014-02-10: qty 8

## 2014-02-10 MED ORDER — ONDANSETRON 8 MG/50ML IVPB (CHCC)
8.0000 mg | Freq: Once | INTRAVENOUS | Status: AC
  Administered 2014-02-10: 8 mg via INTRAVENOUS

## 2014-02-10 MED ORDER — HEPARIN SOD (PORK) LOCK FLUSH 100 UNIT/ML IV SOLN
500.0000 [IU] | Freq: Once | INTRAVENOUS | Status: AC | PRN
  Administered 2014-02-10: 500 [IU]
  Filled 2014-02-10: qty 5

## 2014-02-10 NOTE — Telephone Encounter (Signed)
Pt confirmed labs/ov per 09/29 POF, called for consultation for referral w/IR advised to please call pt to set up apt....gave pt AVS..... KJ

## 2014-02-10 NOTE — Patient Instructions (Signed)
[facility] Discharge Instructions for Patients Receiving Chemotherapy  Today you received the following chemotherapy agents: Oxaliplatin   To help prevent nausea and vomiting after your treatment, we encourage you to take your nausea medication as directed  If you develop nausea and vomiting that is not controlled by your nausea medication, call the clinic.   BELOW ARE SYMPTOMS THAT SHOULD BE REPORTED IMMEDIATELY:  *FEVER GREATER THAN 100.5 F  *CHILLS WITH OR WITHOUT FEVER  NAUSEA AND VOMITING THAT IS NOT CONTROLLED WITH YOUR NAUSEA MEDICATION  *UNUSUAL SHORTNESS OF BREATH  *UNUSUAL BRUISING OR BLEEDING  TENDERNESS IN MOUTH AND THROAT WITH OR WITHOUT PRESENCE OF ULCERS  *URINARY PROBLEMS  *BOWEL PROBLEMS  UNUSUAL RASH Items with * indicate a potential emergency and should be followed up as soon as possible.  Feel free to call the clinic you have any questions or concerns. The clinic phone number is [phone].  

## 2014-02-10 NOTE — Progress Notes (Signed)
[place] OFFICE PROGRESS NOTE   Diagnosis: Colon cancer  INTERVAL HISTORY:   Mr. returns as scheduled. He completed another cycle of CAPOX beginning on 01/21/2012. He denies neuropathy symptoms and hand/foot pain. He has increased pain in the right upper abdomen, relieved with hydrocodone. He takes hydrocodone each night. His energy is diminished and he is taking days off of work to rest.  Objective:  Vital signs in last 24 hours:  Blood pressure 124/90, pulse 106, temperature 98.5 F (36.9 C), temperature source Oral, resp. rate 19, height 6' (1.829 m), weight 186 lb 14.4 oz (84.777 kg), SpO2 98.00%.    HEENT: No thrush or ulcer Resp: Lungs clear bilaterally Cardio: Regular rate and rhythm GI: No hepatomegaly, nontender Vascular: No leg edema Neuro: The vibratory sense is intact at the fingertips bilaterally  Skin: Palms and soles without erythema or skin breakdown   Portacath/PICC-without erythema  Lab Results:  Lab Results  Component Value Date   WBC 8.8 02/10/2014   HGB 10.7* 02/10/2014   HCT 32.9* 02/10/2014   MCV 90.0 02/10/2014   PLT 349 02/10/2014   NEUTROABS 6.5 02/10/2014     Lab Results  Component Value Date   CEA 14.2* 12/16/2013    Medications: I have reviewed the patient's current medications.  Assessment/Plan: 1. Metastatic colon cancer status post low anterior resection on 12/22/2011 (T4, N2, M1). K-ras wild-type (extended K-ras testing with no K-ras, NRAS, or BRAF mutation identified). Initiation of CAPOX/Avastin on 01/23/2012. The CEA was in normal range on 05/28/2012. Restaging CT 04/12/2012 after 4 cycles of CAPOX/Avastin confirmed improvement in the hepatic metastases. He completed cycle 7 CAPOX/Avastin beginning 06/18/2012. He completed cycle 8 systemic therapy with Xeloda and Avastin (oxaliplatin discontinued) beginning 07/09/2012. He completed cycle 9 beginning 07/30/2012 and cycle 10 beginning on 08/20/2012. Restaging CT evaluation  08/13/2012 with further improvement in liver metastases. He began another cycle of Xeloda on 09/10/2012. He declined further Avastin. He completed a cycle of dose reduced Xeloda beginning on 10/01/2012 and was then maintained off of systemic therapy. Systemic therapy resumed with Xeloda/Avastin 12/31/2012 (completed 3 days of Xeloda with cycle 1). The Xeloda was dose reduced with the cycle of chemotherapy beginning 04/01/2013 The invasive adenocarcinoma from the sigmoid colon mass biopsy 12/15/2011 returned microsatellite stable with no loss of mismatch repair protein expression  Restaging CT 04/14/2013, compared to 08/13/2012 revealed a mixed response in the liver  MRI of the abdomen 05/02/2013 confirmed a single calcified left hepatic lobe lesion and multiple peripherally enhancing right hepatic lobe lesions.  Diagnostic laparoscopy 06/10/2013 with findings of adhesions to midline; numerous lesions seen on the right liver with the largest on the dome; peritoneal nodules near right liver mass; small firm areas in left liver; small left liver. 2 biopsies were obtained from the left liver and a peritoneal nodule seen over the right sided liver lesion was also biopsied. Anterior liver core biopsy showed benign liver tissue with mild steatosis; no evidence of malignancy. Posterior liver wedge biopsy showed benign liver tissue; no evidence of malignancy. 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  CEA 06/17/2013 increased at 15.5, 24 on 07/22/2013  Cycle 1 of FOLFIRI/Avastin 07/22/2013.  Cycle 2 FOLFIRI/Avastin 08/05/2013.  Cycle 3 FOLFIRI/Avastin 08/20/2013.  CEA improved at 20.4 on 08/20/2013.  Cycle 4 FOLFIRI/Avastin 09/02/2013.  Cycle 5 FOLFIRI/Avastin 09/17/2013  Cycle 6 FOLFIRI/Avastin 09/30/2013  CT 10/10/2013 with progression of liver metastases.  Initiation of irinotecan/Panitumumab on a 2 week schedule 10/21/2013.  Restaging  CT 12/29/2013 with  progression of a dominant hepatic dome lesion with new satellite lesions and enlargement of a hepatoduodenal ligament lymph node  Cycle 1 salvage CAPOX 12/31/2013 Cycle 2 salvage CAPOX 92 pounds 15 2. Microcytic anemia-resolved. 3. History of weight loss secondary to metastatic colon cancer.  4. Status post Port-A-Cath placement 01/10/2012. 5. Hand-foot syndrome secondary to Xeloda. The Xeloda was dose reduced to 1000 mg twice daily beginning with cycle 4 CAPOX. The hand-foot symptoms progressed. The Xeloda was further dose reduced beginning with treatment on 04/01/2013. Most recent Xeloda dose 1000 mg twice daily. 6. Glaucoma-now on medical therapy. 7. Neutropenia-Neulasta was added with cycle 2 FOLFIRI. 8. History of Rectal bleeding with bowel movements-? Hemorrhoid bleeding,? Related to Avastin.  9. Acne-like rash related to Panitumumab. 10. Right upper quadrant pain-likely secondary to metastatic disease involving the liver   Disposition:  Lawrence Villa has completed 2 cycles of salvage treatment with CAPOX. The plan is to proceed with cycle 3 today. He has not seen interventional radiology. We made a referral to interventional radiology to consider radioembolization therapy. We will followup on the CEA from today. He will return for an office visit in 3 weeks.  I am concerned the increased pain indicates a lack of response to the CAPOX. He will use Tylenol and ibuprofen while at work or driving. He will continue hydrocodone in the evening.  He will return for a CEA a day prior to the next scheduled office visit. We will arrange for a restaging CT if the CEA is higher.  Betsy Coder, MD  02/10/2014  2:21 PM

## 2014-02-11 ENCOUNTER — Telehealth: Payer: Self-pay | Admitting: *Deleted

## 2014-02-11 ENCOUNTER — Other Ambulatory Visit: Payer: Self-pay | Admitting: *Deleted

## 2014-02-11 DIAGNOSIS — C19 Malignant neoplasm of rectosigmoid junction: Secondary | ICD-10-CM

## 2014-02-11 DIAGNOSIS — C787 Secondary malignant neoplasm of liver and intrahepatic bile duct: Secondary | ICD-10-CM

## 2014-02-11 LAB — CEA: CEA: 22.8 ng/mL — AB (ref 0.0–5.0)

## 2014-02-11 NOTE — Telephone Encounter (Signed)
Called pt with CEA results. Dr. Benay Spice recommends CT C/A/P. Pt is hesitant to have scans "so soon." Agrees to scans as long as insurance will cover. Pt reports he has #6 Xeloda tablets. Will need refill to complete this cycle.

## 2014-02-11 NOTE — Telephone Encounter (Signed)
Message copied by Brien Few on Wed Feb 11, 2014  5:12 PM ------      Message from: Betsy Coder B      Created: Wed Feb 11, 2014 12:58 PM       Please call patient, schedule CTs Chest, abd, pelvis with contrast day prior to next visit,       CEA is higher ------

## 2014-02-11 NOTE — Telephone Encounter (Signed)
I have adjusted 10/20 appt 

## 2014-02-12 ENCOUNTER — Other Ambulatory Visit: Payer: Self-pay | Admitting: *Deleted

## 2014-02-12 ENCOUNTER — Telehealth: Payer: Self-pay | Admitting: *Deleted

## 2014-02-12 NOTE — Telephone Encounter (Signed)
Left VM to question quantity of Xeloda ordered as #50. This will not be enough to complete his cycle. Please call back and clarify quantity.

## 2014-02-12 NOTE — Telephone Encounter (Signed)
Called back pharmacy and made them aware Lawrence Villa only needs #50 Xeloda for this cycle since he had drug left over from a prior cycle. They will ship out today.

## 2014-02-12 NOTE — Telephone Encounter (Signed)
Patient called to inquire about CT scan and if he needs prior authorization for the radiation procedure he had discussed with physician? Should he set it up? Made him aware that managed care [employer] Goad) said his [facility] does not require authorization for any outpatient procedures. Instructed him to wait on this until his next CEA and CT scan is done on 03/02/14. Will discuss plans at next office visit. He understands and agrees.

## 2014-02-17 ENCOUNTER — Telehealth: Payer: Self-pay | Admitting: *Deleted

## 2014-02-17 NOTE — Telephone Encounter (Signed)
Received VM from patient asking about CT scan appt. Instructed patient he has lab appt at 4pm, then CT scan of the chest at 430 pm; will see Lattie Haw NP on 03/03/14.  Voices understanding to keep appts and call with any other questions or concerns.

## 2014-03-01 ENCOUNTER — Other Ambulatory Visit: Payer: Self-pay | Admitting: Oncology

## 2014-03-02 ENCOUNTER — Encounter (HOSPITAL_COMMUNITY): Payer: Self-pay

## 2014-03-02 ENCOUNTER — Ambulatory Visit (HOSPITAL_COMMUNITY)
Admission: RE | Admit: 2014-03-02 | Discharge: 2014-03-02 | Disposition: A | Discharge status: Home / Self Care | Payer: Federal, State, Local not specified - PPO | Source: Ambulatory Visit | Attending: Oncology | Admitting: Oncology

## 2014-03-02 ENCOUNTER — Other Ambulatory Visit (HOSPITAL_BASED_OUTPATIENT_CLINIC_OR_DEPARTMENT_OTHER): Payer: Federal, State, Local not specified - PPO

## 2014-03-02 DIAGNOSIS — C801 Malignant (primary) neoplasm, unspecified: Secondary | ICD-10-CM | POA: Diagnosis not present

## 2014-03-02 DIAGNOSIS — C19 Malignant neoplasm of rectosigmoid junction: Secondary | ICD-10-CM

## 2014-03-02 DIAGNOSIS — C787 Secondary malignant neoplasm of liver and intrahepatic bile duct: Secondary | ICD-10-CM | POA: Diagnosis not present

## 2014-03-02 DIAGNOSIS — C189 Malignant neoplasm of colon, unspecified: Secondary | ICD-10-CM

## 2014-03-02 LAB — COMPREHENSIVE METABOLIC PANEL (CC13)
ALT: 23 U/L (ref 0–55)
ANION GAP: 10 meq/L (ref 3–11)
AST: 102 U/L — ABNORMAL HIGH (ref 5–34)
Albumin: 2.7 g/dL — ABNORMAL LOW (ref 3.5–5.0)
Alkaline Phosphatase: 353 U/L — ABNORMAL HIGH (ref 40–150)
BILIRUBIN TOTAL: 0.69 mg/dL (ref 0.20–1.20)
BUN: 10 mg/dL (ref 7.0–26.0)
CO2: 27 mEq/L (ref 22–29)
CREATININE: 0.7 mg/dL (ref 0.7–1.3)
Calcium: 10.2 mg/dL (ref 8.4–10.4)
Chloride: 99 mEq/L (ref 98–109)
Glucose: 83 mg/dl (ref 70–140)
Potassium: 4.9 mEq/L (ref 3.5–5.1)
Sodium: 136 mEq/L (ref 136–145)
Total Protein: 7.9 g/dL (ref 6.4–8.3)

## 2014-03-02 LAB — CBC WITH DIFFERENTIAL/PLATELET
BASO%: 0.5 % (ref 0.0–2.0)
BASOS ABS: 0 10*3/uL (ref 0.0–0.1)
EOS%: 0.6 % (ref 0.0–7.0)
Eosinophils Absolute: 0.1 10*3/uL (ref 0.0–0.5)
HEMATOCRIT: 32.6 % — AB (ref 38.4–49.9)
HGB: 10.3 g/dL — ABNORMAL LOW (ref 13.0–17.1)
LYMPH%: 21 % (ref 14.0–49.0)
MCH: 27.7 pg (ref 27.2–33.4)
MCHC: 31.5 g/dL — AB (ref 32.0–36.0)
MCV: 88 fL (ref 79.3–98.0)
MONO#: 1.6 10*3/uL — ABNORMAL HIGH (ref 0.1–0.9)
MONO%: 15.3 % — AB (ref 0.0–14.0)
NEUT#: 6.5 10*3/uL (ref 1.5–6.5)
NEUT%: 62.6 % (ref 39.0–75.0)
PLATELETS: 439 10*3/uL — AB (ref 140–400)
RBC: 3.7 10*6/uL — ABNORMAL LOW (ref 4.20–5.82)
RDW: 20.6 % — ABNORMAL HIGH (ref 11.0–14.6)
WBC: 10.4 10*3/uL — ABNORMAL HIGH (ref 4.0–10.3)
lymph#: 2.2 10*3/uL (ref 0.9–3.3)

## 2014-03-02 MED ORDER — IOHEXOL 300 MG/ML  SOLN
100.0000 mL | Freq: Once | INTRAMUSCULAR | Status: AC | PRN
  Administered 2014-03-02: 100 mL via INTRAVENOUS

## 2014-03-03 ENCOUNTER — Other Ambulatory Visit: Payer: Federal, State, Local not specified - PPO

## 2014-03-03 ENCOUNTER — Ambulatory Visit (HOSPITAL_BASED_OUTPATIENT_CLINIC_OR_DEPARTMENT_OTHER): Payer: Federal, State, Local not specified - PPO | Admitting: Nurse Practitioner

## 2014-03-03 ENCOUNTER — Telehealth: Payer: Self-pay | Admitting: Nurse Practitioner

## 2014-03-03 ENCOUNTER — Ambulatory Visit: Payer: Federal, State, Local not specified - PPO

## 2014-03-03 VITALS — BP 129/77 | HR 92 | Temp 98.4°F | Resp 18 | Ht 72.0 in | Wt 184.0 lb

## 2014-03-03 DIAGNOSIS — R1011 Right upper quadrant pain: Secondary | ICD-10-CM

## 2014-03-03 DIAGNOSIS — C7970 Secondary malignant neoplasm of unspecified adrenal gland: Secondary | ICD-10-CM

## 2014-03-03 DIAGNOSIS — C787 Secondary malignant neoplasm of liver and intrahepatic bile duct: Secondary | ICD-10-CM

## 2014-03-03 DIAGNOSIS — C19 Malignant neoplasm of rectosigmoid junction: Secondary | ICD-10-CM

## 2014-03-03 DIAGNOSIS — C187 Malignant neoplasm of sigmoid colon: Secondary | ICD-10-CM

## 2014-03-03 LAB — CEA: CEA: 35.4 ng/mL — ABNORMAL HIGH (ref 0.0–5.0)

## 2014-03-03 MED ORDER — OXYCODONE HCL 5 MG PO TABS: 5.0000 mg | ORAL_TABLET | ORAL | Status: DC | PRN

## 2014-03-03 NOTE — Telephone Encounter (Signed)
Pt confirmed ov per 10/20 POF, gave pt AVS.... KJ

## 2014-03-03 NOTE — Progress Notes (Addendum)
[place] OFFICE PROGRESS NOTE   Diagnosis:  Colon cancer  INTERVAL HISTORY:   Mr. Badeaux returns as scheduled. He reports progressive right-sided abdominal pain. He takes Tylenol and Vicodin with partial relief. No nausea/vomiting. No constipation. Appetite is poor.  Objective:  Vital signs in last 24 hours:  Blood pressure 129/77, pulse 92, temperature 98.4 F (36.9 C), temperature source Oral, resp. rate 18, height 6' (1.829 m), weight 184 lb (83.462 kg).    HEENT: No thrush or ulcers. Resp: Lungs clear. Breath sounds diminished at the right lung base. Cardio: Regular rate and rhythm. GI: Liver palpable approximately 4 fingerbreadths below the right costal margin. Vascular: No leg edema.   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 History of liver metastasis. Chemotherapy ongoing. Low back pain.  EXAM: CT CHEST, ABDOMEN, AND PELVIS WITH CONTRAST  TECHNIQUE: Multidetector CT imaging of the chest, abdomen and pelvis was performed following the standard protocol during bolus administration of intravenous contrast.  CONTRAST:  116mL OMNIPAQUE IOHEXOL 300 MG/ML  SOLN  COMPARISON:  CT abdomen pelvis 12/29/2013, CT thorax 12/18/2011  FINDINGS: CT CHEST FINDINGS  There are bilateral pulmonary nodules consistent with pulmonary metastasis. Larger nodules are in the upper lobes. For example 11 mm nodule in the left upper lobe on image 15, series 4. 11 mm nodule in the right middle lobe on image 34. Compared to most recent CT of abdomen from 12/29/2013 8 mm nodule at in the posterior right lower lobe (image 38, series 4) compares to 3 mm nodule on prior. 4 mm nodular most inferior left lower lobe on  image 48 is not seen on prior.  CT ABDOMEN AND PELVIS FINDINGS  Hepatobiliary: Progression of hepatic metastasis with enlargement of the confluent multi lobular low-density lesion occupying and expanding the right hepatic lobe. Lesion measures 12.7 x 16.0 cm on image 49 increased from 7.1 by 13.0 cm on prior. Partially calcified lesion in the inferior right hepatic lobe is stable at 4.5 cm. Gallbladder is normal.  Pancreas: Pancreas is normal. No duct dilatation. No pancreatic inflammation.  Spleen: Spleen is normal.  Adrenals/Urinary Tract: Increased enlargement of the right adrenal gland measuring 3.3 cm in greatest dimension compared to 1 . 7 cm on prior. No renal cortical lesion.  Stomach/Bowel: Stomach, small bowel, and cecum are normal. There is a moderate volume stool throughout the colon. There is a anastomosis in the mid sigmoid colon without evidence of nodularity. There is a moderate volume stool in the rectum.  Vascular/Lymphatic: There is interval increase in size of periportal lymph nodes. These lymph nodes have central low attenuation consistent with necrosis. Example lymph node measures 21 mm short axis on image 26, series 2 . Retroperitoneal Lymph nodes left of the aorta are newly enlarged including 21 mm node on image 67 and 20 mm node on image 67. Adenopathy extends inferiorly to the aortic caval location with a new 22 mm lymph node.  Reproductive: Prostate gland is normal.  Other: No peritoneal disease or omental disease.  Musculoskeletal: No aggressive osseous lesion.  IMPRESSION: Chest Impression:  1. Bilateral pulmonary metastasis. 2. No mediastinal or nodal metastasis.  Abdomen / Pelvis Impression:  1. Marked progression of hepatic metastasis with interval increase in volume of the confluent mass lesion expanded right hepatic lobe. 2. New retroperitoneal periaortic lymphadenopathy. 3. Increase in size and right adrenal gland metastasis. 4. Moderate volume of stool throughout the colon without  obstructing lesion identified. The sigmoid anastomosis appears normal without local recurrence. 5. Please see chest impression for new pulmonary metastasis.   Electronically Signed   By: Suzy Bouchard M.D.   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 History of liver metastasis. Chemotherapy ongoing. Low back pain.  EXAM: CT CHEST, ABDOMEN, AND PELVIS WITH CONTRAST  TECHNIQUE: Multidetector CT imaging of the chest, abdomen and pelvis was performed following the standard protocol during bolus administration of intravenous contrast.  CONTRAST:  13mL OMNIPAQUE IOHEXOL 300 MG/ML  SOLN  COMPARISON:  CT abdomen pelvis 12/29/2013, CT thorax 12/18/2011  FINDINGS: CT CHEST FINDINGS  There are bilateral pulmonary nodules consistent with pulmonary metastasis. Larger nodules are in the upper lobes. For example 11 mm nodule in the left upper lobe on image 15, series 4. 11 mm nodule in the right middle lobe on image 34. Compared to most recent CT of abdomen from 12/29/2013 8 mm nodule at in the posterior right lower lobe (image 38, series 4) compares to 3 mm nodule on prior. 4 mm nodular most inferior left lower lobe on image 48 is not seen on prior.  CT ABDOMEN AND PELVIS FINDINGS  Hepatobiliary: Progression of hepatic metastasis with enlargement of the confluent multi lobular low-density lesion occupying and expanding the right hepatic lobe. Lesion measures 12.7 x 16.0 cm on image 49 increased from 7.1 by 13.0 cm on prior. Partially calcified lesion in the inferior right hepatic lobe is stable at 4.5 cm. Gallbladder is normal.  Pancreas: Pancreas is normal. No duct dilatation. No pancreatic inflammation.  Spleen: Spleen is normal.  Adrenals/Urinary Tract: Increased enlargement of the right adrenal gland measuring 3.3 cm in greatest dimension compared to 1 . 7 cm on prior. No renal cortical lesion.  Stomach/Bowel: Stomach,  small bowel, and cecum are normal. There is a moderate volume stool throughout the colon. There is a anastomosis in the mid sigmoid colon without evidence of nodularity. There is a moderate volume stool in the rectum.  Vascular/Lymphatic: There is interval increase in size of periportal lymph nodes. These lymph nodes have central low attenuation consistent with necrosis. Example lymph node measures 21 mm short axis on image 26, series 2 . Retroperitoneal Lymph nodes left of the aorta are newly enlarged including 21 mm node on image 67 and 20 mm node on image 67. Adenopathy extends inferiorly to the aortic caval location with a new 22 mm lymph node.  Reproductive: Prostate gland is normal.  Other: No peritoneal disease or omental disease.  Musculoskeletal: No aggressive osseous lesion.  IMPRESSION: Chest Impression:  1. Bilateral pulmonary metastasis. 2. No mediastinal or nodal metastasis.  Abdomen / Pelvis Impression:  1. Marked progression of hepatic metastasis with interval increase in volume of the confluent mass lesion expanded right hepatic lobe. 2. New retroperitoneal periaortic lymphadenopathy. 3. Increase in size and right adrenal gland metastasis. 4. Moderate volume of stool throughout the colon without obstructing lesion identified. The sigmoid anastomosis appears normal without local recurrence. 5. Please see chest impression for new pulmonary metastasis.   Electronically Signed   By: Suzy Bouchard M.D.   On: 03/02/2014 17:34  Medications: I have reviewed the patient's current medications.  Assessment/Plan: 1. Metastatic colon cancer status post low anterior resection on 12/22/2011 (T4, N2, M1). K-ras wild-type (extended K-ras testing with no K-ras, NRAS, or BRAF mutation identified). The tumor showed no evidence of microsatellite instability by PCR. No loss of expression of major or minor mismatch repair proteins. Initiation of CAPOX/Avastin on 01/23/2012. The CEA was in normal range on  05/28/2012. Restaging CT 04/12/2012 after 4 cycles of CAPOX/Avastin confirmed improvement in the hepatic metastases. He completed cycle 7 CAPOX/Avastin beginning 06/18/2012. He completed cycle 8 systemic therapy with Xeloda and Avastin (oxaliplatin discontinued) beginning 07/09/2012. He completed cycle 9 beginning 07/30/2012 and cycle 10 beginning on 08/20/2012. Restaging CT evaluation 08/13/2012 with further improvement in liver metastases. He began another cycle of Xeloda on 09/10/2012. He declined further Avastin. He completed a cycle of dose reduced Xeloda beginning on 10/01/2012 and was then maintained off of systemic therapy. Systemic therapy resumed with Xeloda/Avastin 12/31/2012 (completed 3 days of Xeloda with cycle 1). The Xeloda was dose reduced with the cycle of chemotherapy beginning 04/01/2013 The invasive adenocarcinoma from the sigmoid colon mass biopsy 12/15/2011 returned microsatellite stable with no loss of mismatch repair protein expression  Restaging CT 04/14/2013, compared to 08/13/2012 revealed a mixed response in the liver  MRI of the abdomen 05/02/2013 confirmed a single calcified left hepatic lobe lesion and multiple peripherally enhancing right hepatic lobe lesions.  Diagnostic laparoscopy 06/10/2013 with findings of adhesions to midline; numerous lesions seen on the right liver with the largest on the dome; peritoneal nodules near right liver mass; small firm areas in left liver; small left liver. 2 biopsies were obtained from the left liver and a peritoneal nodule seen over the right sided liver lesion was also biopsied. Anterior liver core biopsy showed benign liver tissue with mild steatosis; no evidence of malignancy. Posterior liver wedge biopsy showed benign liver tissue; no evidence of malignancy. 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  CEA 06/17/2013 increased at 15.5, 24 on 07/22/2013  Cycle 1 of FOLFIRI/Avastin  07/22/2013.  Cycle 2 FOLFIRI/Avastin 08/05/2013.  Cycle 3 FOLFIRI/Avastin 08/20/2013.  CEA improved at 20.4 on 08/20/2013.  Cycle 4 FOLFIRI/Avastin 09/02/2013.  Cycle 5 FOLFIRI/Avastin 09/17/2013  Cycle 6 FOLFIRI/Avastin 09/30/2013  CT 10/10/2013 with progression of liver metastases.  Initiation of irinotecan/Panitumumab on a 2 week schedule 10/21/2013.  Restaging CT 12/29/2013 with progression of a dominant hepatic dome lesion with new satellite lesions and enlargement of a hepatoduodenal ligament lymph node  Cycle 1 salvage CAPOX 12/31/2013  Cycle 2 salvage CAPOX 01/20/2014 Cycle 3 salvage CAPOX 02/10/2014 Restaging CT evaluation 03/02/2014 with bilateral pulmonary nodules; new retroperitoneal periaortic lymphadenopathy; marked progression of hepatic metastasis; increase in size of right adrenal gland metastasis 2. Microcytic anemia-resolved. 3. History of weight loss secondary to metastatic colon cancer.  4. Status post Port-A-Cath placement 01/10/2012. 5. Hand-foot syndrome secondary to Xeloda. The Xeloda was dose reduced to 1000 mg twice daily beginning with cycle 4 CAPOX. The hand-foot symptoms progressed. The Xeloda was further dose reduced beginning with treatment on 04/01/2013. Most recent Xeloda dose 1000 mg twice daily. 6. Glaucoma-now on medical therapy. 7. Neutropenia-Neulasta was added with cycle 2 FOLFIRI. 8. History of Rectal bleeding with bowel movements-? Hemorrhoid bleeding,? Related to Avastin.  9. Acne-like rash related to Panitumumab. 10. Right upper quadrant pain secondary to metastatic disease involving the liver   Disposition: Mr. Fogleman has progressive metastatic colon cancer. He has been treated with multiple systemic therapies. Dr.  Sherrill recommends a referral to Dr. Reynaldo Minium at [facility] for possible clinical trial enrollment. Mr. Cafarelli is in agreement.  He is symptomatic with abdominal pain related to the liver metastases. We gave him a prescription for oxycodone  5-10 mg every 4 hours as needed. He will contact the office if this is not effective. He understands that he should not be driving or working while taking narcotics.  We scheduled a return visit in approximately 2 weeks. He will contact the office in the interim as outlined above or with any other problems.  Patient seen with Dr. Benay Spice. CT images were reviewed on the computer with Mr. Payson. 25 minutes were spent face-to-face at today's visit with the majority of that time involved in counseling/coordination of care.    [name] ANP/GNP-BC   03/03/2014  12:49 PM  This was a shared visit with [name]. Mr. Schmutz has clinical and x-ray evidence of disease progression. He has been treated with multiple systemic therapies and standard treatment options are limited. We can consider radioembolization for the symptomatic liver disease, but there is disease outside of the liver. We discussed regorafenib, 5-FU/Aflibercept, and Lonsurf. He stated that he would likely not be interested in trying these agents unless there is a significant predicted clinical response. I will refer him to Dr. Reynaldo Minium to see if he is eligible for a clinical trial and get Dr. Margarette Canada opinion regarding standard options.  Mr. Hosick will return for an office visit in 2 weeks.  Julieanne Manson, M.D.

## 2014-03-04 ENCOUNTER — Telehealth: Payer: Self-pay | Admitting: Oncology

## 2014-03-04 NOTE — Telephone Encounter (Signed)
Pt appt. To see Dr. Reynaldo Minium @ [facility] is 03/18/14@3 :00. Medical records faxed. Slides and scans will be fedex'ed. Left pt vm in ref to appt.

## 2014-03-09 ENCOUNTER — Telehealth: Payer: Self-pay | Admitting: *Deleted

## 2014-03-09 ENCOUNTER — Telehealth: Payer: Self-pay | Admitting: Certified Registered Nurse Anesthetist

## 2014-03-09 NOTE — Telephone Encounter (Signed)
Received phone call from patient's sister Lawrence Villa) wanting to ask [facility]. [facility] questions regarding second opinion at [facility].  Per Patient no information to be given.  Informed patient that he would need to update HIPPA form at next appointment.  Called and informed sister per HIPPA no information can be given at this time.  Patient's sister verbalized understanding.

## 2014-03-10 NOTE — Telephone Encounter (Signed)
Message from pt stating he plans to keep appt at [facility] 11/4, he may have said he wasn't interested during visit. Wanted to be sure Dr. Benay Spice and Lattie Haw knows he will keep appt.

## 2014-03-20 ENCOUNTER — Telehealth: Payer: Self-pay | Admitting: Oncology

## 2014-03-20 ENCOUNTER — Ambulatory Visit (HOSPITAL_BASED_OUTPATIENT_CLINIC_OR_DEPARTMENT_OTHER): Payer: Federal, State, Local not specified - PPO

## 2014-03-20 ENCOUNTER — Ambulatory Visit (HOSPITAL_BASED_OUTPATIENT_CLINIC_OR_DEPARTMENT_OTHER): Payer: Federal, State, Local not specified - PPO | Admitting: Oncology

## 2014-03-20 VITALS — BP 123/78 | HR 118 | Temp 98.4°F | Resp 18 | Ht 72.0 in | Wt 186.3 lb

## 2014-03-20 DIAGNOSIS — R1011 Right upper quadrant pain: Secondary | ICD-10-CM

## 2014-03-20 DIAGNOSIS — C787 Secondary malignant neoplasm of liver and intrahepatic bile duct: Secondary | ICD-10-CM

## 2014-03-20 DIAGNOSIS — C7970 Secondary malignant neoplasm of unspecified adrenal gland: Secondary | ICD-10-CM

## 2014-03-20 DIAGNOSIS — C19 Malignant neoplasm of rectosigmoid junction: Secondary | ICD-10-CM

## 2014-03-20 DIAGNOSIS — C187 Malignant neoplasm of sigmoid colon: Secondary | ICD-10-CM

## 2014-03-20 DIAGNOSIS — F419 Anxiety disorder, unspecified: Secondary | ICD-10-CM

## 2014-03-20 MED ORDER — ALPRAZOLAM 0.5 MG PO TABS: 0.5000 mg | ORAL_TABLET | Freq: Four times a day (QID) | ORAL | Status: AC | PRN

## 2014-03-20 MED ORDER — HEPARIN SOD (PORK) LOCK FLUSH 100 UNIT/ML IV SOLN
500.0000 [IU] | Freq: Once | INTRAVENOUS | Status: AC
  Administered 2014-03-20: 500 [IU] via INTRAVENOUS
  Filled 2014-03-20: qty 5

## 2014-03-20 MED ORDER — SODIUM CHLORIDE 0.9 % IJ SOLN
10.0000 mL | INTRAMUSCULAR | Status: DC | PRN
  Administered 2014-03-20: 10 mL via INTRAVENOUS
  Filled 2014-03-20: qty 10

## 2014-03-20 NOTE — Patient Instructions (Signed)
[place] Discharge Instructions  RECOMMENDATIONS MADE BY THE CONSULTANT AND ANY TEST RESULTS WILL BE SENT TO YOUR REFERRING PHYSICIAN.  MEDICATIONS PRESCRIBED:  Xanax 0.5 mg by mouth every 6 hours as needed for anxiety  INSTRUCTIONS GIVEN AND DISCUSSED: PAC flush today in flush room  SPECIAL INSTRUCTIONS/FOLLOW-UP:   Thank you for choosing [facility] to provide your oncology and hematology care.  To afford each patient quality time with our providers, please arrive at least 30 minutes before your scheduled appointment time.  With your help, our goal is to use those 30 minutes to complete the necessary work-up to ensure our physicians have the information they need to help with your evaluation and healthcare recommendations.     ___________________  Should you have questions after your visit to [facility], please contact our office at [phone] between the hours of 8:30 a.m. and 4:30 p.m.  Voicemails left after 4:00 p.m. will not be returned until the following business day.  For prescription refill requests, have your pharmacy contact our office with your prescription refill request. We request 24 hour notice for all refill requests.

## 2014-03-20 NOTE — Progress Notes (Signed)
[facility] OFFICE PROGRESS NOTE   Diagnosis: colon cancer  INTERVAL HISTORY:   Lawrence Villa returns as scheduled. He decided against the consultation with Dr. Reynaldo Villa. He does not wish to receive further chemotherapy. He continues to have pain in the right upper abdomen, relieved with oxycodone. He complains of anxiety. He is no longer working. Good appetite. Minimal dyspnea.  Objective:  Vital signs in last 24 hours:  Blood pressure 123/78, pulse 118, temperature 98.4 F (36.9 C), temperature source Oral, resp. rate 18, height 6' (1.829 m), weight 186 lb 4.8 oz (84.505 kg).    HEENT: ? Early buccal thrush Resp: decreased breath sounds at the right lower chest, no respiratory distress Cardio: regular rate and rhythm, tachycardia GI: the liver is palpable throughout the right upper abdomen, nontender Vascular: no leg edema  Portacath/PICC-without erythema   Medications: I have reviewed the patient's current medications.  Assessment/Plan: 1. Metastatic colon cancer status post low anterior resection on 12/22/2011 (T4, N2, M1). K-ras wild-type (extended K-ras testing with no K-ras, NRAS, or BRAF mutation identified). The tumor showed no evidence of microsatellite instability by PCR. No loss of expression of major or minor mismatch repair proteins. Initiation of CAPOX/Avastin on 01/23/2012. The CEA was in normal range on 05/28/2012. Restaging CT 04/12/2012 after 4 cycles of CAPOX/Avastin confirmed improvement in the hepatic metastases. He completed cycle 7 CAPOX/Avastin beginning 06/18/2012. He completed cycle 8 systemic therapy with Xeloda and Avastin (oxaliplatin discontinued) beginning 07/09/2012. He completed cycle 9 beginning 07/30/2012 and cycle 10 beginning on 08/20/2012. Restaging CT evaluation 08/13/2012 with further improvement in liver metastases. He began another cycle of Xeloda on 09/10/2012. He declined further Avastin. He completed a cycle of dose reduced  Xeloda beginning on 10/01/2012 and was then maintained off of systemic therapy. Systemic therapy resumed with Xeloda/Avastin 12/31/2012 (completed 3 days of Xeloda with cycle 1). The Xeloda was dose reduced with the cycle of chemotherapy beginning 04/01/2013  The invasive adenocarcinoma from the sigmoid colon mass biopsy 12/15/2011 returned microsatellite stable with no loss of mismatch repair protein expression   Restaging CT 04/14/2013, compared to 08/13/2012 revealed a mixed response in the liver   MRI of the abdomen 05/02/2013 confirmed a single calcified left hepatic lobe lesion and multiple peripherally enhancing right hepatic lobe lesions.   Diagnostic laparoscopy 06/10/2013 with findings of adhesions to midline; numerous lesions seen on the right liver with the largest on the dome; peritoneal nodules near right liver mass; small firm areas in left liver; small left liver. 2 biopsies were obtained from the left liver and a peritoneal nodule seen over the right sided liver lesion was also biopsied. Anterior liver core biopsy showed benign liver tissue with mild steatosis; no evidence of malignancy. Posterior liver wedge biopsy showed benign liver tissue; no evidence of malignancy. 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   CEA 06/17/2013 increased at 15.5, 24 on 07/22/2013   Cycle 1 of FOLFIRI/Avastin 07/22/2013.   Cycle 2 FOLFIRI/Avastin 08/05/2013.   Cycle 3 FOLFIRI/Avastin 08/20/2013.   CEA improved at 20.4 on 08/20/2013.   Cycle 4 FOLFIRI/Avastin 09/02/2013.   Cycle 5 FOLFIRI/Avastin 09/17/2013   Cycle 6 FOLFIRI/Avastin 09/30/2013   CT 10/10/2013 with progression of liver metastases.   Initiation of irinotecan/Panitumumab on a 2 week schedule 10/21/2013.   Restaging CT 12/29/2013 with progression of a dominant hepatic dome lesion with new satellite lesions and enlargement of a hepatoduodenal ligament lymph  node   Cycle 1 salvage CAPOX 12/31/2013  Cycle 2 salvage CAPOX 01/20/2014  Cycle 3 salvage CAPOX 02/10/2014  Restaging CT evaluation 03/02/2014 with bilateral pulmonary nodules; new retroperitoneal periaortic lymphadenopathy; marked progression of hepatic metastasis; increase in size of right adrenal gland metastasis 2. Microcytic anemia-resolved. 3. History of weight loss secondary to metastatic colon cancer.  4. Status post Port-A-Cath placement 01/10/2012. 5. Hand-foot syndrome secondary to Xeloda. The Xeloda was dose reduced to 1000 mg twice daily beginning with cycle 4 CAPOX. The hand-foot symptoms progressed. The Xeloda was further dose reduced beginning with treatment on 04/01/2013. Most recent Xeloda dose 1000 mg twice daily. 6. Glaucoma-now on medical therapy. 7. Neutropenia-Neulasta was added with cycle 2 FOLFIRI. 8. History of Rectal bleeding with bowel movements-? Hemorrhoid bleeding,? Related to Avastin.  9. History of an Acne-like rash related to Panitumumab. 10. Right upper quadrant pain secondary to metastatic disease involving the liver  Disposition:  Lawrence Villa has progressive metastatic colon cancer. His performance status appears to be declining. We added exam next for anxiety. The Port-A-Cath was flushed today. I discussed regorafenib and Lonsurf with him again today.he does not wish to be treated with further chemotherapy. The plan is to proceed with a comfort care approach. He will return for an office visit in 3 weeks. He will contact us in the interim for increased pain or new symptoms. I plan to discuss Hospice care with him when he returns in 3 weeks.  Betsy Coder, MD  03/20/2014  11:29 AM

## 2014-03-20 NOTE — Telephone Encounter (Signed)
Gave avs & cal for Nov.  °

## 2014-03-25 ENCOUNTER — Telehealth: Payer: Self-pay | Admitting: *Deleted

## 2014-03-25 ENCOUNTER — Ambulatory Visit (HOSPITAL_BASED_OUTPATIENT_CLINIC_OR_DEPARTMENT_OTHER): Payer: Federal, State, Local not specified - PPO | Admitting: Nurse Practitioner

## 2014-03-25 VITALS — BP 127/77 | HR 99 | Temp 98.1°F | Resp 18 | Ht 72.0 in | Wt 192.4 lb

## 2014-03-25 DIAGNOSIS — F419 Anxiety disorder, unspecified: Secondary | ICD-10-CM

## 2014-03-25 DIAGNOSIS — R1011 Right upper quadrant pain: Secondary | ICD-10-CM

## 2014-03-25 DIAGNOSIS — C787 Secondary malignant neoplasm of liver and intrahepatic bile duct: Secondary | ICD-10-CM

## 2014-03-25 DIAGNOSIS — F411 Generalized anxiety disorder: Secondary | ICD-10-CM

## 2014-03-25 DIAGNOSIS — C187 Malignant neoplasm of sigmoid colon: Secondary | ICD-10-CM

## 2014-03-25 DIAGNOSIS — C19 Malignant neoplasm of rectosigmoid junction: Secondary | ICD-10-CM

## 2014-03-25 DIAGNOSIS — C7970 Secondary malignant neoplasm of unspecified adrenal gland: Secondary | ICD-10-CM

## 2014-03-25 MED ORDER — OXYCODONE HCL ER 20 MG PO T12A: 20.0000 mg | EXTENDED_RELEASE_TABLET | Freq: Two times a day (BID) | ORAL | Status: DC

## 2014-03-25 NOTE — Telephone Encounter (Signed)
Call from pt requesting office visit. He reports worsening abdominal pain. Reports he is taking Oxycodone 1 tab Q 4 hours. Seems anxious. Reports Xanax is not helping. Wants to get hospice involved. Instructed pt to come in to office at 1115. Will be worked in to see Lattie Haw, [place].   Received message from pt's sister concerned about his pain and anxiety. Requested I did not mention to pt that she called. Did not return call, she is not listed on ROI.

## 2014-03-25 NOTE — Patient Instructions (Signed)
Xanax 0.5 mg take 1-2 tabs every 6 hours as needed for anxiety Oxycodone 5 mg take 1-3 tabs every 4 hours as needed Oxycontin 20 mg every 12 hours   We will contact the [facility] program regarding a referral

## 2014-03-25 NOTE — Progress Notes (Addendum)
[place] OFFICE PROGRESS NOTE   Diagnosis:  Colon cancer  INTERVAL HISTORY:   Mr. Homeyer returns prior to scheduled followup. He is experiencing increased anxiety. He has taken Xanax on one occasion for sleep. He states the anxiety is "hereditary". He has increased right-sided abdominal pain. He also notes pain at the left abdomen. Oxycodone has been minimally effective. He typically takes one 5 mg tablet, occasionally he takes 2 tablets.  He is interested in a hospice referral.  Objective:  Vital signs in last 24 hours:  Blood pressure 127/77, pulse 99, temperature 98.1 F (36.7 C), temperature source Oral, resp. rate 18, height 6' (1.829 m), weight 192 lb 6.4 oz (87.272 kg), SpO2 99 %.      Resp: breath sounds diminished at the right base. Cardio: regular rate and rhythm. GI: liver palpable throughout the right upper abdomen with associated tenderness. Vascular: trace bilateral pretibial edema. Port-A-Cath without erythema.   Lab Results:  Lab Results  Component Value Date   WBC 10.4* 03/02/2014   HGB 10.3* 03/02/2014   HCT 32.6* 03/02/2014   MCV 88.0 03/02/2014   PLT 439* 03/02/2014   NEUTROABS 6.5 03/02/2014    Imaging:  No results found.  Medications: I have reviewed the patient's current medications.  Assessment/Plan: 1. Metastatic colon cancer status post low anterior resection on 12/22/2011 (T4, N2, M1). K-ras wild-type (extended K-ras testing with no K-ras, NRAS, or BRAF mutation identified). The tumor showed no evidence of microsatellite instability by PCR. No loss of expression of major or minor mismatch repair proteins. Initiation of CAPOX/Avastin on 01/23/2012. The CEA was in normal range on 05/28/2012. Restaging CT 04/12/2012 after 4 cycles of CAPOX/Avastin confirmed improvement in the hepatic metastases. He completed cycle 7 CAPOX/Avastin beginning 06/18/2012. He completed cycle 8 systemic therapy with Xeloda and Avastin (oxaliplatin  discontinued) beginning 07/09/2012. He completed cycle 9 beginning 07/30/2012 and cycle 10 beginning on 08/20/2012. Restaging CT evaluation 08/13/2012 with further improvement in liver metastases. He began another cycle of Xeloda on 09/10/2012. He declined further Avastin. He completed a cycle of dose reduced Xeloda beginning on 10/01/2012 and was then maintained off of systemic therapy. Systemic therapy resumed with Xeloda/Avastin 12/31/2012 (completed 3 days of Xeloda with cycle 1). The Xeloda was dose reduced with the cycle of chemotherapy beginning 04/01/2013  The invasive adenocarcinoma from the sigmoid colon mass biopsy 12/15/2011 returned microsatellite stable with no loss of mismatch repair protein expression   Restaging CT 04/14/2013, compared to 08/13/2012 revealed a mixed response in the liver   MRI of the abdomen 05/02/2013 confirmed a single calcified left hepatic lobe lesion and multiple peripherally enhancing right hepatic lobe lesions.   Diagnostic laparoscopy 06/10/2013 with findings of adhesions to midline; numerous lesions seen on the right liver with the largest on the dome; peritoneal nodules near right liver mass; small firm areas in left liver; small left liver. 2 biopsies were obtained from the left liver and a peritoneal nodule seen over the right sided liver lesion was also biopsied. Anterior liver core biopsy showed benign liver tissue with mild steatosis; no evidence of malignancy. Posterior liver wedge biopsy showed benign liver tissue; no evidence of malignancy. 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   CEA 06/17/2013 increased at 15.5, 24 on 07/22/2013   Cycle 1 of FOLFIRI/Avastin 07/22/2013.   Cycle 2 FOLFIRI/Avastin 08/05/2013.   Cycle 3 FOLFIRI/Avastin 08/20/2013.   CEA improved at 20.4 on 08/20/2013.   Cycle 4 FOLFIRI/Avastin 09/02/2013.  Cycle 5 FOLFIRI/Avastin 09/17/2013   Cycle 6  FOLFIRI/Avastin 09/30/2013   CT 10/10/2013 with progression of liver metastases.   Initiation of irinotecan/Panitumumab on a 2 week schedule 10/21/2013.   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 Microcytic anemia-resolved. 3. History of weight loss secondary to metastatic colon cancer.  4. Status post Port-A-Cath placement 01/10/2012. 5. Hand-foot syndrome secondary to Xeloda. The Xeloda was dose reduced to 1000 mg twice daily beginning with cycle 4 CAPOX. The hand-foot symptoms progressed. The Xeloda was further dose reduced beginning with treatment on 04/01/2013. Most recent Xeloda dose 1000 mg twice daily. 6. Glaucoma-now on medical therapy. 7. Neutropenia-Neulasta was added with cycle 2 FOLFIRI. 8. History of Rectal bleeding with bowel movements-? Hemorrhoid bleeding,? Related to Avastin.  9. History of an Acne-like rash related to Panitumumab. 10. Right upper quadrant pain secondary to metastatic disease involving the liver   Disposition: Mr. Eiland is experiencing increased pain and anxiety. We gave him instructions to increase the oxycodone 5 mg to 1-3 tablets every 4 hours as needed. He will begin OxyContin 20 mg every 12 hours. For the anxiety he will increase Xanax to 0.5 mg one to 2 tablets every 6 hours as needed. He understands to contact the office if these measures are not effective.  We discussed a hospice referral. He is in agreement. We reviewed CPR/ACLS. Per his wishes he will be placed on NO CODE BLUE status. We will contact the  [facility] program.  Mr. Warchol will return for a followup visit as scheduled on 04/03/2014. He will contact the office in the interim as outlined above or with any other problems.  Patient seen with Dr. Benay Spice.    [name] ANP/GNP-BC   03/25/2014  2:20 PM   This was a shared visit with [name].  Mr. Vanoverbeke will enroll in the [facility] program.  We adjusted the narcotic pain regimen.  He will be placed on a NCB status.  Julieanne Manson, MD

## 2014-03-26 ENCOUNTER — Telehealth: Payer: Self-pay | Admitting: *Deleted

## 2014-03-26 NOTE — Telephone Encounter (Signed)
Call from [place], [facility] RN asking if it is OK with Dr. Benay Spice for the hospice MD to sign DNR for the home? YES. Social worker has seen pt already, nursing will see later today.

## 2014-03-30 ENCOUNTER — Telehealth: Payer: Self-pay | Admitting: *Deleted

## 2014-03-30 ENCOUNTER — Other Ambulatory Visit: Payer: Self-pay | Admitting: *Deleted

## 2014-03-30 DIAGNOSIS — C19 Malignant neoplasm of rectosigmoid junction: Secondary | ICD-10-CM

## 2014-03-30 DIAGNOSIS — C787 Secondary malignant neoplasm of liver and intrahepatic bile duct: Secondary | ICD-10-CM

## 2014-03-30 MED ORDER — OXYCODONE HCL 5 MG PO TABS: 5.0000 mg | ORAL_TABLET | ORAL | Status: DC | PRN

## 2014-03-30 NOTE — Telephone Encounter (Signed)
Received message from [facility] requesting re-fill of pt Oxy IR.  Per Dr. Benay Spice; left voice message for Angela Nevin that re-fill request has been sent to pt pharmacy.

## 2014-04-03 ENCOUNTER — Ambulatory Visit (HOSPITAL_BASED_OUTPATIENT_CLINIC_OR_DEPARTMENT_OTHER): Payer: Federal, State, Local not specified - PPO | Admitting: Nurse Practitioner

## 2014-04-03 ENCOUNTER — Telehealth: Payer: Self-pay | Admitting: Oncology

## 2014-04-03 ENCOUNTER — Encounter: Payer: Self-pay | Admitting: Nurse Practitioner

## 2014-04-03 VITALS — BP 117/71 | HR 104 | Temp 97.5°F | Resp 18 | Ht 72.0 in | Wt 190.8 lb

## 2014-04-03 DIAGNOSIS — C187 Malignant neoplasm of sigmoid colon: Secondary | ICD-10-CM

## 2014-04-03 DIAGNOSIS — G893 Neoplasm related pain (acute) (chronic): Secondary | ICD-10-CM

## 2014-04-03 DIAGNOSIS — R911 Solitary pulmonary nodule: Secondary | ICD-10-CM

## 2014-04-03 DIAGNOSIS — C787 Secondary malignant neoplasm of liver and intrahepatic bile duct: Secondary | ICD-10-CM

## 2014-04-03 DIAGNOSIS — D709 Neutropenia, unspecified: Secondary | ICD-10-CM

## 2014-04-03 DIAGNOSIS — C7971 Secondary malignant neoplasm of right adrenal gland: Secondary | ICD-10-CM

## 2014-04-03 NOTE — Progress Notes (Addendum)
[place] OFFICE PROGRESS NOTE   Diagnosis:  Colon cancer   INTERVAL HISTORY:   Lawrence Villa returns as scheduled. He reports improved pain control since beginning OxyContin. He is requiring less breakthrough pain medication. Anxiety is better with Xanax. He feels he is becoming weaker. He reports persistent leg swelling. He has noted abdominal distention.  Objective:  Vital signs in last 24 hours:  Blood pressure 117/71, pulse 104, temperature 97.5 F (36.4 C), temperature source Oral, resp. rate 18, height 6' (1.829 m), weight 190 lb 12.8 oz (86.546 kg), SpO2 98 %.    HEENT: no thrush. Mucous membranes appear moist. Resp: lungs clear. Breath sounds diminished at the right base. Cardio: regular rate and rhythm. GI: abdomen is distended, consistent with ascites. Hepatomegaly. Vascular: pitting lower leg edema bilaterally.  Port-A-Cath without erythema.    Lab Results:  Lab Results  Component Value Date   WBC 10.4* 03/02/2014   HGB 10.3* 03/02/2014   HCT 32.6* 03/02/2014   MCV 88.0 03/02/2014   PLT 439* 03/02/2014   NEUTROABS 6.5 03/02/2014    Imaging:  No results found.  Medications: I have reviewed the patient's current medications.  Assessment/Plan: 1. Metastatic colon cancer status post low anterior resection on 12/22/2011 (T4, N2, M1). K-ras wild-type (extended K-ras testing with no K-ras, NRAS, or BRAF mutation identified). The tumor showed no evidence of microsatellite instability by PCR. No loss of expression of major or minor mismatch repair proteins. Initiation of CAPOX/Avastin on 01/23/2012. The CEA was in normal range on 05/28/2012. Restaging CT 04/12/2012 after 4 cycles of CAPOX/Avastin confirmed improvement in the hepatic metastases. He completed cycle 7 CAPOX/Avastin beginning 06/18/2012. He completed cycle 8 systemic therapy with Xeloda and Avastin (oxaliplatin discontinued) beginning 07/09/2012. He completed cycle 9 beginning 07/30/2012 and  cycle 10 beginning on 08/20/2012. Restaging CT evaluation 08/13/2012 with further improvement in liver metastases. He began another cycle of Xeloda on 09/10/2012. He declined further Avastin. He completed a cycle of dose reduced Xeloda beginning on 10/01/2012 and was then maintained off of systemic therapy. Systemic therapy resumed with Xeloda/Avastin 12/31/2012 (completed 3 days of Xeloda with cycle 1). The Xeloda was dose reduced with the cycle of chemotherapy beginning 04/01/2013  The invasive adenocarcinoma from the sigmoid colon mass biopsy 12/15/2011 returned microsatellite stable with no loss of mismatch repair protein expression   Restaging CT 04/14/2013, compared to 08/13/2012 revealed a mixed response in the liver   MRI of the abdomen 05/02/2013 confirmed a single calcified left hepatic lobe lesion and multiple peripherally enhancing right hepatic lobe lesions.   Diagnostic laparoscopy 06/10/2013 with findings of adhesions to midline; numerous lesions seen on the right liver with the largest on the dome; peritoneal nodules near right liver mass; small firm areas in left liver; small left liver. 2 biopsies were obtained from the left liver and a peritoneal nodule seen over the right sided liver lesion was also biopsied. Anterior liver core biopsy showed benign liver tissue with mild steatosis; no evidence of malignancy. Posterior liver wedge biopsy showed benign liver tissue; no evidence of malignancy. 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   CEA 06/17/2013 increased at 15.5, 24 on 07/22/2013   Cycle 1 of FOLFIRI/Avastin 07/22/2013.   Cycle 2 FOLFIRI/Avastin 08/05/2013.   Cycle 3 FOLFIRI/Avastin 08/20/2013.   CEA improved at 20.4 on 08/20/2013.   Cycle 4 FOLFIRI/Avastin 09/02/2013.   Cycle 5 FOLFIRI/Avastin 09/17/2013   Cycle 6 FOLFIRI/Avastin 09/30/2013   CT 10/10/2013 with progression of  liver metastases.    Initiation of irinotecan/Panitumumab on a 2 week schedule 10/21/2013.   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 Microcytic anemia-resolved. 3. History of weight loss secondary to metastatic colon cancer.  4. Status post Port-A-Cath placement 01/10/2012. 5. Hand-foot syndrome secondary to Xeloda. The Xeloda was dose reduced to 1000 mg twice daily beginning with cycle 4 CAPOX. The hand-foot symptoms progressed. The Xeloda was further dose reduced beginning with treatment on 04/01/2013. Most recent Xeloda dose 1000 mg twice daily. 6. Glaucoma-now on medical therapy. 7. Neutropenia-Neulasta was added with cycle 2 FOLFIRI. 8. History of Rectal bleeding with bowel movements-? Hemorrhoid bleeding,? Related to Avastin.  9. History of an Acne-like rash related to Panitumumab. 10. Right upper quadrant pain secondary to metastatic disease involving the liver   Disposition: Mr. Stuck' performance status is slowly declining. He is now enrolled in the [facility] hospice program. He reports improved pain control and less anxiety with the medication adjustments made at the time of his last visit. He will continue the same. He would like to return for a followup visit in 4 weeks. We will see him on 05-30-14. He knows he can contact the office prior to that visit with any problems.  Patient seen with Dr. Benay Spice.    [name] ANP/GNP-BC   04/03/2014  12:18 PM  This was a shared visit with [name].  His pain is under better control.  He will continue  f/u with the [facility] program.  Julieanne Manson, MD

## 2014-04-03 NOTE — Telephone Encounter (Signed)
Gave avs & cal for Dec. °

## 2014-04-06 ENCOUNTER — Telehealth: Payer: Self-pay | Admitting: *Deleted

## 2014-04-06 NOTE — Telephone Encounter (Signed)
Per Dr. Benay Spice; notified Carla, [place] that MD ordered Oxycontin to be taken every 12 hours; would not recommend he take them every 8 hours.  Dr. Benay Spice also wants pt to be assessed for [facility].  Angela Nevin verbalized understanding of all instructions.

## 2014-04-06 NOTE — Telephone Encounter (Signed)
Lawrence Villa, [place] [facility] [place] reporting update:  Pt states he is only eating one meal a day; Abdomen is distended and not symmetrical; pt states he is taking his Oxycontin 20mg  every 8 hours because he does not like how the Oxy IR makes him feel sleepy; Lawrence Villa states that he is slightly confused today; pt's mother is on her way to his house.  "Is it Okay for pt to take pain meds like this?"  Note to Dr. Benay Spice

## 2014-04-07 ENCOUNTER — Other Ambulatory Visit: Payer: Self-pay | Admitting: *Deleted

## 2014-04-07 DIAGNOSIS — C787 Secondary malignant neoplasm of liver and intrahepatic bile duct: Secondary | ICD-10-CM

## 2014-04-07 DIAGNOSIS — C19 Malignant neoplasm of rectosigmoid junction: Secondary | ICD-10-CM

## 2014-04-07 MED ORDER — OXYCODONE HCL 5 MG PO TABS: 5.0000 mg | ORAL_TABLET | ORAL | Status: DC | PRN

## 2014-04-17 ENCOUNTER — Telehealth: Payer: Self-pay | Admitting: *Deleted

## 2014-04-17 NOTE — Telephone Encounter (Signed)
Call from [place], [facility] RN with an update on Mr. Coffield. She reports R abdomen is more distended. Dyspneic when ambulating. Pt reports pain is managed with meds. Pt is requesting more frequent visits though he declines portions of the assessment. Mother is in the home, she reports he is sleeping most of the day.

## 2014-04-23 ENCOUNTER — Other Ambulatory Visit: Payer: Self-pay | Admitting: *Deleted

## 2014-04-23 DIAGNOSIS — F419 Anxiety disorder, unspecified: Secondary | ICD-10-CM

## 2014-04-23 DIAGNOSIS — C787 Secondary malignant neoplasm of liver and intrahepatic bile duct: Secondary | ICD-10-CM

## 2014-04-23 DIAGNOSIS — C19 Malignant neoplasm of rectosigmoid junction: Secondary | ICD-10-CM

## 2014-04-23 MED ORDER — OXYCODONE HCL 5 MG PO TABS: 5.0000 mg | ORAL_TABLET | ORAL | Status: AC | PRN

## 2014-04-23 MED ORDER — OXYCODONE HCL ER 20 MG PO T12A: 20.0000 mg | EXTENDED_RELEASE_TABLET | Freq: Two times a day (BID) | ORAL | Status: AC

## 2014-04-23 NOTE — Telephone Encounter (Signed)
Refills needed for the weekend.

## 2014-04-24 MED ORDER — TRAVOPROST (BAK FREE) 0.004 % OP SOLN: 1.0000 [drp] | Freq: Every day | OPHTHALMIC | Status: AC

## 2014-04-24 NOTE — Addendum Note (Signed)
Addended by: Tania Ade on: 04/24/2014 10:31 AM   Modules accepted: Orders

## 2014-04-24 NOTE — Telephone Encounter (Signed)
Confirmed with Lawrence Villa that his pharmacy is [facility] on [address]. [facility]ed narcotic scripts to [facility] and electronically sent eye drop to pharmacy.

## 2014-04-28 ENCOUNTER — Telehealth: Payer: Self-pay | Admitting: *Deleted

## 2014-04-28 ENCOUNTER — Encounter: Payer: Self-pay | Admitting: Oncology

## 2014-04-28 NOTE — Telephone Encounter (Signed)
Rapid decline in status. Not able to get OOB now. Not able to eat or swallow pills. Going to D/C all po meds, except oxycodone tablets-will teach family to crush and put in small syringe. Takes sips of liquids only. In and out of confusion. She will talk with Dr. Lyman Speller tomorrow about a transfer to [facility]. Patient and family agree this is now necessary.

## 2014-04-28 NOTE — Progress Notes (Signed)
Patient has asst with [id number] Avastin  08/20/13-08/19/14 24,000. Fraser Din JO#[id number]

## 2014-05-01 ENCOUNTER — Ambulatory Visit: Payer: Federal, State, Local not specified - PPO | Admitting: Oncology

## 2014-05-01 ENCOUNTER — Telehealth: Payer: Self-pay | Admitting: *Deleted

## 2014-05-04 ENCOUNTER — Ambulatory Visit: Payer: Federal, State, Local not specified - PPO | Admitting: Oncology

## 2014-05-15 NOTE — Telephone Encounter (Signed)
Message from Lawrence Villa at [facility]. Pt died this morning. Will make Dr. Benay Spice aware.

## 2014-05-15 DEATH — deceased

## 2014-07-07 ENCOUNTER — Encounter: Payer: Self-pay | Admitting: Oncology

## 2014-07-07 NOTE — Progress Notes (Signed)
Patient had asst with [employer] thru 08/20/14.

## 2014-11-29 IMAGING — CT CT ABD-PELV W/ CM
2 of 4 series · 17 of 46 positions shown, 19 images · IV contrast (OMNIPAQUE)
Comparison: MR abdomen dated 05/01/2013. CT abdomen pelvis dated
04/14/2013.

CLINICAL DATA: Restaging metastatic colon cancer

EXAM:
CT ABDOMEN AND PELVIS WITH CONTRAST
TECHNIQUE: Multidetector CT imaging of the abdomen and pelvis was performed
using the standard protocol following bolus administration of
intravenous contrast.
CONTRAST:  100mL OMNIPAQUE IOHEXOL 300 MG/ML  SOLN

[Series 2: rtn a/p with · axial · 0.74mm/px · z∈[-480,-60]mm · 14 of 93 slices shown, 16 images]
[im 5/93  soft-tissue]
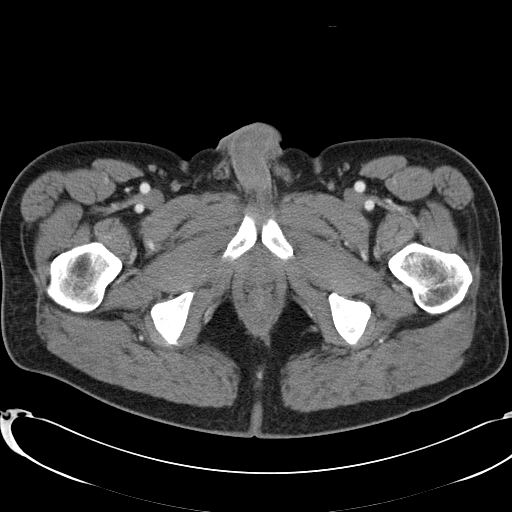
[im 5/93  bone]
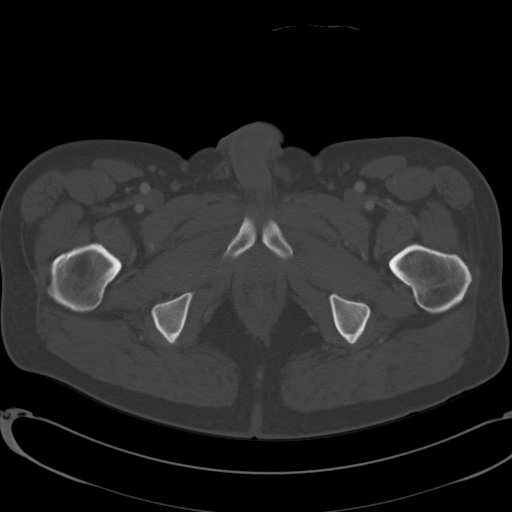
[im 13/93  soft-tissue]
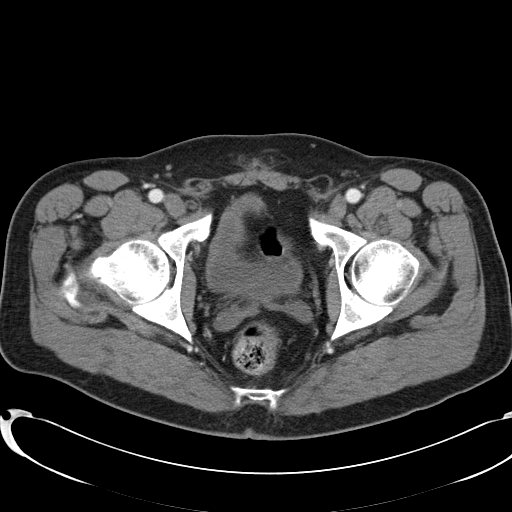
[im 17/93  soft-tissue]
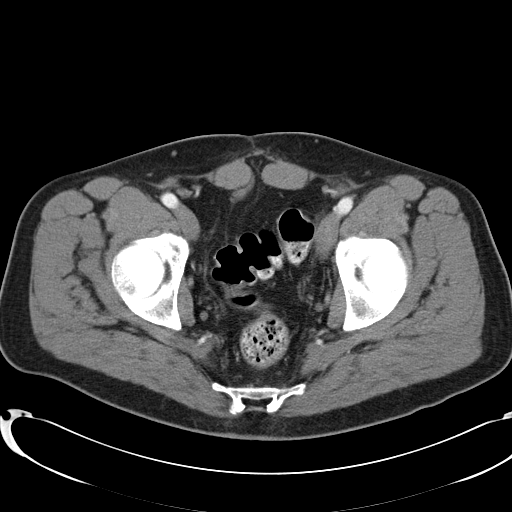
[im 25/93  soft-tissue]
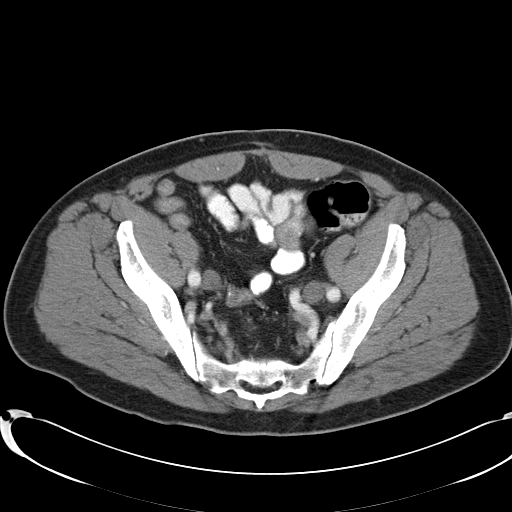
[im 33/93  soft-tissue]
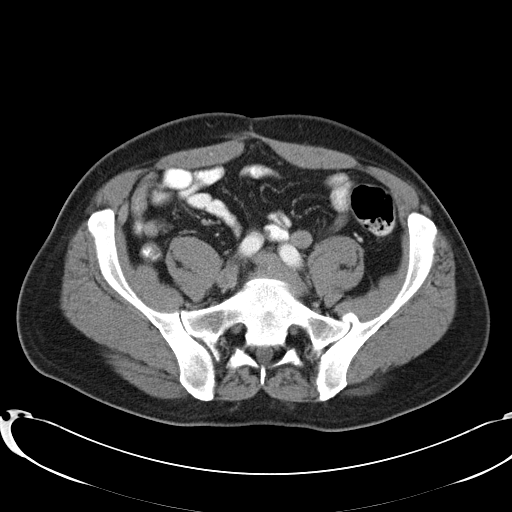
[im 37/93  soft-tissue]
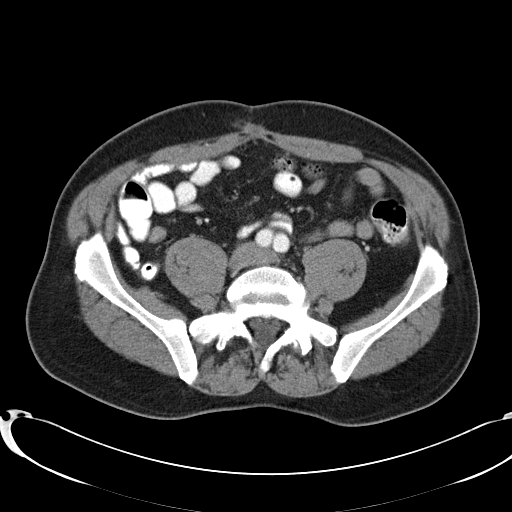
[im 45/93  soft-tissue]
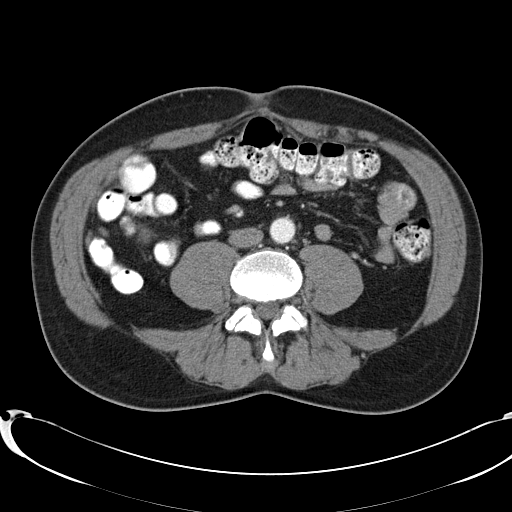
[im 49/93  soft-tissue]
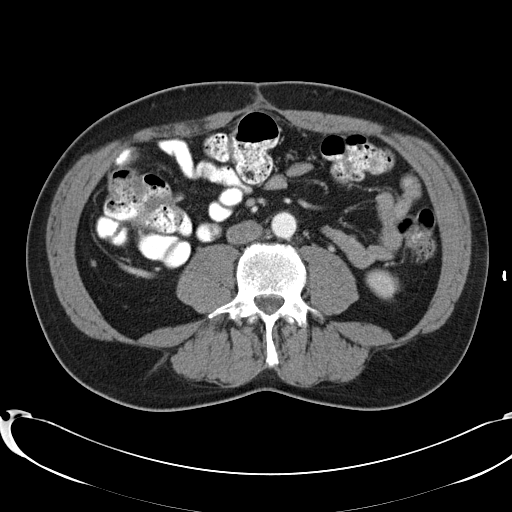
[im 57/93  soft-tissue]
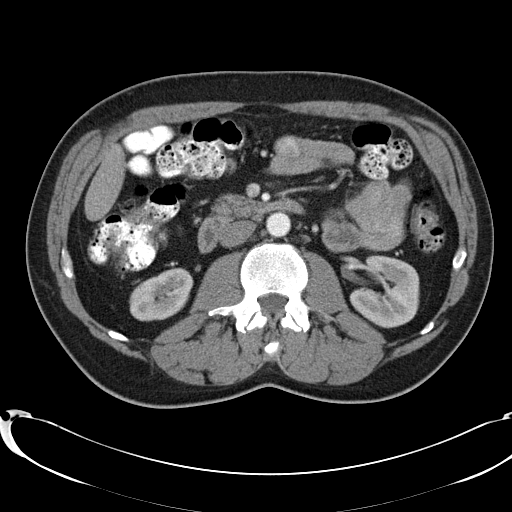
[im 57/93  bone]
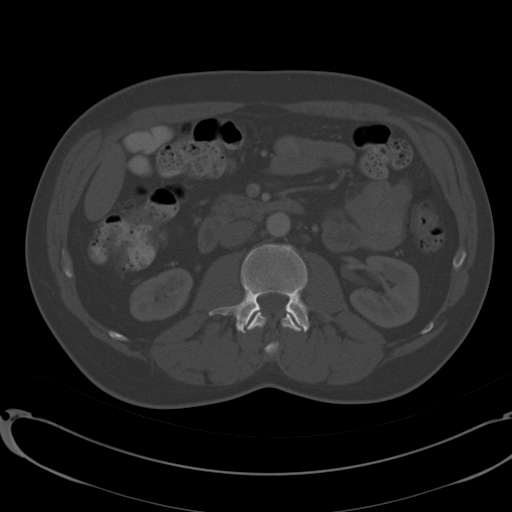
[im 61/93  soft-tissue]
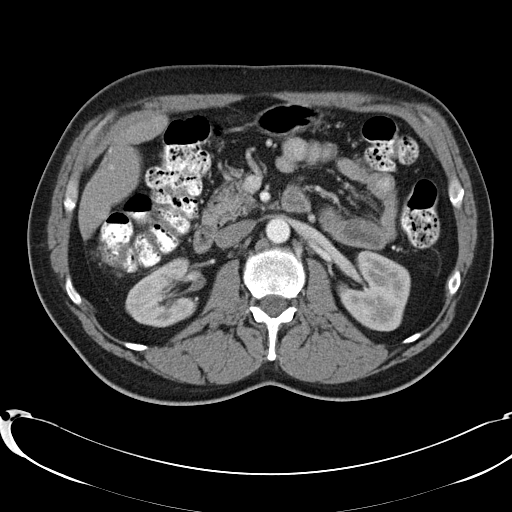
[im 69/93  soft-tissue]
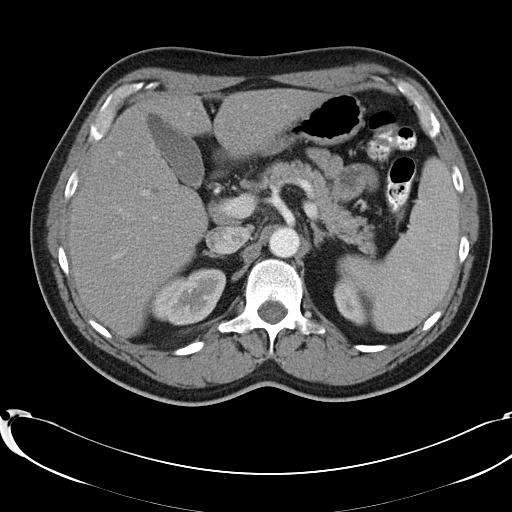
[im 77/93  soft-tissue]
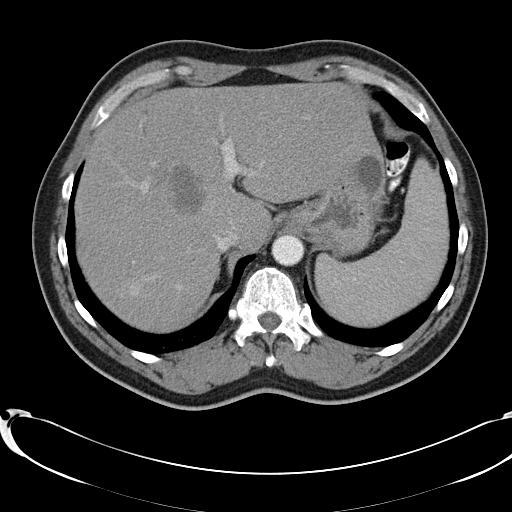
[im 81/93  soft-tissue]
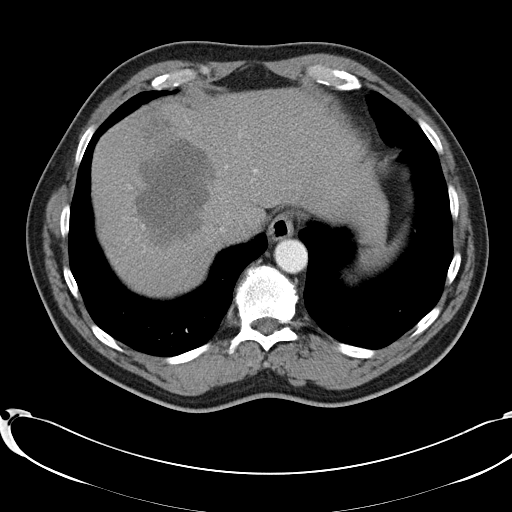
[im 89/93  soft-tissue]
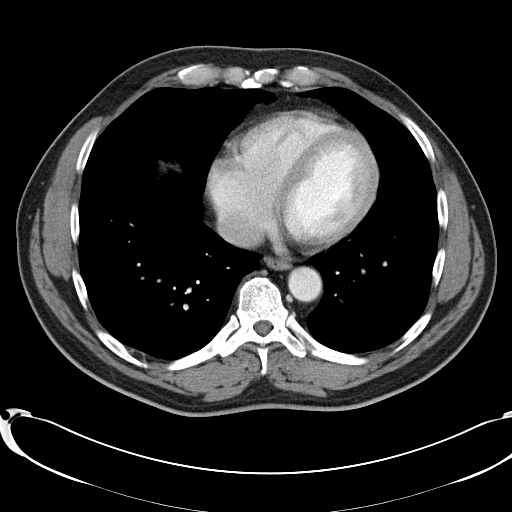

[Series 602: <mpr thick range> · coronal · 0.91mm/px · 3 of 103 slices shown]
[im 35/103  soft-tissue]
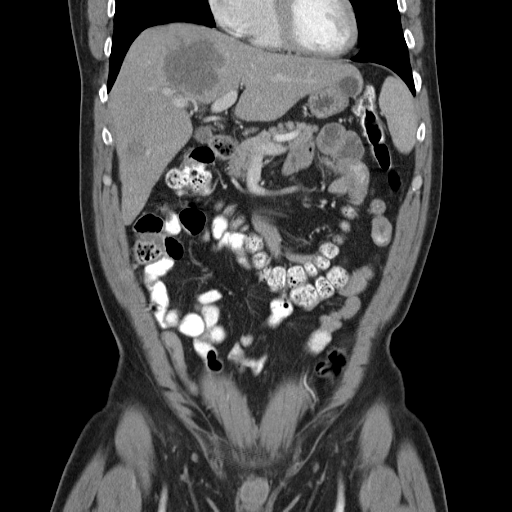
[im 46/103  soft-tissue]
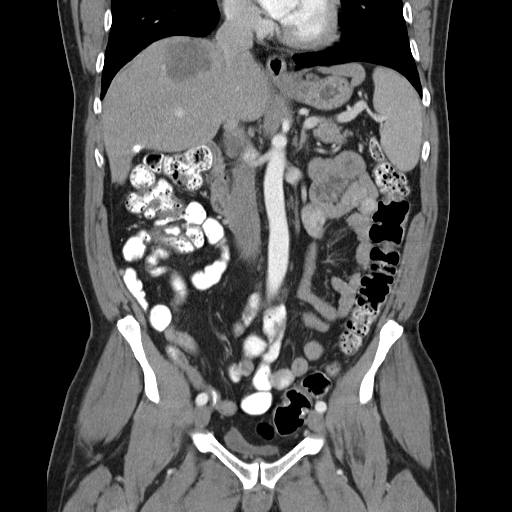
[im 57/103  soft-tissue]
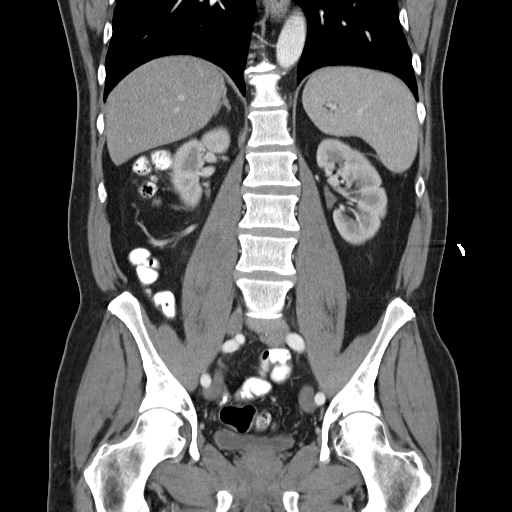

[17 of 46 positions shown; findings below may reference images not displayed]

FINDINGS: Multifocal hepatic metastases, overall increased, including:

--8.7 x 5.3 cm lesion in the anterior segment right hepatic dome
(series 2/ image 12), previously 5.3 x 3.7 cm

--3.0 x 3.6 cm lesion in the medial segment left hepatic dome
(series 2/ image 10), previously 2.9 x 3.8 cm, unchanged

--2.5 x 1.8 cm lesion in the lateral segment left hepatic lobe
(series 2/ image 16), new

--4.5 x 3.1 cm lesion inferiorly in the posterior segment right
hepatic lobe (series 2/image 28), previously 5.0 x 3.6 cm

Spur spleen, pancreas, and adrenal glands are within normal limits.

Gallbladder is unremarkable. No intrahepatic or extrahepatic ductal
dilatation.

Kidneys within normal limits.  No hydronephrosis.

Status post low anterior resection with suture line in the pelvis
(series 2/image 34). No evidence of bowel obstruction.

No evidence of abdominal aortic aneurysm.

No abdominopelvic ascites.

No suspicious abdominopelvic lymphadenopathy.

Prostate is unremarkable.

Bladder is underdistended but within normal limits.

Small fat containing periumbilical hernia.

Degenerative changes of the visualized thoracolumbar spine.
IMPRESSION: Status post low anterior resection.

Progression of multifocal hepatic metastases, as above.

## 2015-10-29 ENCOUNTER — Other Ambulatory Visit: Payer: Self-pay | Admitting: Nurse Practitioner

## 2015-11-01 ENCOUNTER — Other Ambulatory Visit: Payer: Self-pay | Admitting: Nurse Practitioner
